# Patient Record
Sex: Female | Born: 1950
Health system: Southern US, Community
[De-identification: ages and names within clinical notes are randomized; demographics above are authoritative.]

## PROBLEM LIST (undated history)

## (undated) DIAGNOSIS — R7401 Elevation of levels of liver transaminase levels: Secondary | ICD-10-CM

## (undated) DIAGNOSIS — E785 Hyperlipidemia, unspecified: Secondary | ICD-10-CM

## (undated) DIAGNOSIS — R55 Syncope and collapse: Secondary | ICD-10-CM

## (undated) DIAGNOSIS — N390 Urinary tract infection, site not specified: Secondary | ICD-10-CM

## (undated) DIAGNOSIS — H40009 Preglaucoma, unspecified, unspecified eye: Secondary | ICD-10-CM

## (undated) DIAGNOSIS — N9489 Other specified conditions associated with female genital organs and menstrual cycle: Secondary | ICD-10-CM

## (undated) HISTORY — DX: Elevation of levels of liver transaminase levels: R74.01

## (undated) HISTORY — PX: TUBAL LIGATION: SHX77

## (undated) HISTORY — PX: TONSILLECTOMY: SUR1361

## (undated) HISTORY — DX: Hyperlipidemia, unspecified: E78.5

## (undated) HISTORY — PX: EXCISION OF ADNEXAL MASS: SHX5820

## (undated) HISTORY — DX: Preglaucoma, unspecified, unspecified eye: H40.009

---

## 1971-10-11 DIAGNOSIS — Z9289 Personal history of other medical treatment: Secondary | ICD-10-CM

## 1971-10-11 HISTORY — DX: Personal history of other medical treatment: Z92.89

## 1974-10-10 HISTORY — PX: TUBAL LIGATION: SHX77

## 2005-06-07 ENCOUNTER — Ambulatory Visit: Payer: Self-pay | Admitting: Family Medicine

## 2005-07-21 ENCOUNTER — Ambulatory Visit: Payer: Self-pay | Admitting: Family Medicine

## 2005-08-02 ENCOUNTER — Ambulatory Visit: Payer: Self-pay | Admitting: Family Medicine

## 2007-03-27 ENCOUNTER — Ambulatory Visit: Payer: Self-pay | Admitting: Emergency Medicine

## 2007-08-20 ENCOUNTER — Ambulatory Visit: Payer: Self-pay | Admitting: Family Medicine

## 2007-08-21 ENCOUNTER — Ambulatory Visit: Payer: Self-pay | Admitting: Family Medicine

## 2007-08-23 ENCOUNTER — Ambulatory Visit: Payer: Self-pay | Admitting: Family Medicine

## 2007-10-01 ENCOUNTER — Other Ambulatory Visit: Payer: Self-pay

## 2007-10-01 ENCOUNTER — Emergency Department: Payer: Self-pay | Admitting: Internal Medicine

## 2007-10-19 ENCOUNTER — Ambulatory Visit: Payer: Self-pay | Admitting: Unknown Physician Specialty

## 2007-10-19 LAB — HM COLONOSCOPY

## 2007-12-06 ENCOUNTER — Ambulatory Visit: Payer: Self-pay | Admitting: Family Medicine

## 2008-12-18 ENCOUNTER — Ambulatory Visit: Payer: Self-pay | Admitting: Family Medicine

## 2008-12-26 ENCOUNTER — Emergency Department: Payer: Self-pay | Admitting: Unknown Physician Specialty

## 2009-12-24 ENCOUNTER — Other Ambulatory Visit: Payer: Self-pay | Admitting: General Practice

## 2010-08-10 ENCOUNTER — Ambulatory Visit: Payer: Self-pay

## 2010-08-18 ENCOUNTER — Ambulatory Visit: Payer: Self-pay

## 2011-03-08 ENCOUNTER — Emergency Department: Payer: Self-pay | Admitting: Emergency Medicine

## 2011-03-10 ENCOUNTER — Encounter: Payer: Self-pay | Admitting: Cardiovascular Disease

## 2011-03-10 ENCOUNTER — Ambulatory Visit (INDEPENDENT_AMBULATORY_CARE_PROVIDER_SITE_OTHER): Payer: No Typology Code available for payment source | Admitting: Cardiovascular Disease

## 2011-03-10 DIAGNOSIS — R079 Chest pain, unspecified: Secondary | ICD-10-CM | POA: Insufficient documentation

## 2011-03-10 MED ORDER — NITROGLYCERIN 0.4 MG SL SUBL: 0.4000 mg | SUBLINGUAL_TABLET | SUBLINGUAL | Status: DC | PRN

## 2011-03-10 NOTE — Assessment & Plan Note (Signed)
Etiology of her chest pain is uncertain. Preliminary workup did not suggest cardiac etiology. She chronically feels well and has been exercising with no symptoms. She does have a history of hyperlipidemia but no significant smoking or diabetes. She does have a family history though both of her parents were smokers. We have suggested we start with a routine treadmill study which we will do early next week at her convenience. If this is positive or shows worrisome symptoms, we would proceed with a Myoview.  If she has additional episodes, we have suggested she take a nitroglycerin sublingual and have given her a prescription.  ` we would also recommend further evaluation in the emergency room with a CT scan.

## 2011-03-10 NOTE — Patient Instructions (Signed)
You are doing well. Please call to schedule the treadmill when it works for your schedule. Please call us if you have new issues that need to be addressed

## 2011-03-10 NOTE — Progress Notes (Signed)
   Patient ID: Mary [name], female    DOB: [birth year], 60 y.o.   MRN: [id number]  HPI Comments: Mary [name] is a very pleasant 60 year old woman who works at [employer] with no significant past medical history apart from borderline hyperlipidemia per her report who was recently evaluated at the [facility] emergency room after an episode of chest pain, sweating and dizziness.  2 days ago on May 29 she reports that she was walking and she had the acute onset of severe left-sided chest pain. The sharp pain lasted for 15 minutes. The more she walked, the worse it got. She sat down, had sweating and some dizziness. Eventually she sat down on the floor. A emergency room physician was called and she was evaluated in the emergency room. Her chest x-ray, lab work, EKG was essentially benign. She was discharged with followup in our clinic.  She went back to work yesterday and even walked at lunchtime for exercise and felt well with no symptoms of chest discomfort. She has not had an episode like this before.  EKG today shows normal sinus rhythm with rate 59 beats per minute with no significant ST or T wave changes     Review of Systems  Constitutional: Negative.   HENT: Negative.   Eyes: Negative.   Respiratory: Negative.   Cardiovascular: Positive for chest pain.  Gastrointestinal: Negative.   Musculoskeletal: Negative.   Skin: Negative.   Neurological: Positive for dizziness.  Hematological: Negative.   Psychiatric/Behavioral: Negative.   All other systems reviewed and are negative.    BP 106/62  Pulse 59  Ht 5\' 6"  (1.676 m)  Wt 176 lb (79.833 kg)  BMI 28.41 kg/m2   Physical Exam  Nursing note and vitals reviewed. Constitutional: She is oriented to person, place, and time. She appears well-developed and well-nourished.  HENT:  Head: Normocephalic.  Nose: Nose normal.  Mouth/Throat: Oropharynx is clear and moist.  Eyes: Conjunctivae are normal. Pupils are equal, round, and reactive to light.    Neck: Normal range of motion. Neck supple. No JVD present.  Cardiovascular: Normal rate, regular rhythm, S1 normal, S2 normal, normal heart sounds and intact distal pulses.  Exam reveals no gallop and no friction rub.   No murmur heard. Pulmonary/Chest: Effort normal and breath sounds normal. No respiratory distress. She has no wheezes. She has no rales. She exhibits no tenderness.  Abdominal: Soft. Bowel sounds are normal. She exhibits no distension. There is no tenderness.  Musculoskeletal: Normal range of motion. She exhibits no edema and no tenderness.  Lymphadenopathy:    She has no cervical adenopathy.  Neurological: She is alert and oriented to person, place, and time. Coordination normal.  Skin: Skin is warm and dry. No rash noted. No erythema.  Psychiatric: She has a normal mood and affect. Her behavior is normal. Judgment and thought content normal.         Assessment and Plan

## 2011-03-11 ENCOUNTER — Telehealth: Payer: Self-pay | Admitting: *Deleted

## 2011-03-11 NOTE — Telephone Encounter (Signed)
LMOM on pt's cell phone, and notified to call back with any questions. Gave pt instructions to eat a light breakfast only, wear comfortable clothing and tennis shoes for walking. Pt does not take any meds. Notified pt to arrive at registration at [facility] at 0730 for an 0800 appt. Advised pt to call back with any questions.

## 2011-03-11 NOTE — Telephone Encounter (Signed)
Called pt to notify her that her ETT is scheduled at [facility] for 03/16/11 @ 0800 and she will need to arrive at 0730. Will give pt instructions when calls back.

## 2011-03-16 ENCOUNTER — Ambulatory Visit: Payer: Self-pay | Admitting: Cardiovascular Disease

## 2011-03-16 DIAGNOSIS — R079 Chest pain, unspecified: Secondary | ICD-10-CM

## 2012-12-27 ENCOUNTER — Other Ambulatory Visit: Payer: Self-pay | Admitting: Family Medicine

## 2012-12-27 LAB — LIPID PANEL
Cholesterol: 195 mg/dL (ref 0–200)
HDL: 51 mg/dL (ref 35–70)
LDL CALC: 113 mg/dL
TRIGLYCERIDES: 155 mg/dL (ref 40–160)

## 2012-12-27 LAB — CBC WITH DIFFERENTIAL/PLATELET
Basophil #: 0.1 10*3/uL (ref 0.0–0.1)
Eosinophil %: 2.5 %
HCT: 42.2 % (ref 35.0–47.0)
HGB: 14.1 g/dL (ref 12.0–16.0)
Lymphocyte #: 1.8 10*3/uL (ref 1.0–3.6)
Lymphocyte %: 33.9 %
MCH: 30 pg (ref 26.0–34.0)
MCHC: 33.3 g/dL (ref 32.0–36.0)
Monocyte #: 0.4 x10 3/mm (ref 0.2–0.9)
Neutrophil %: 55.2 %
Platelet: 337 10*3/uL (ref 150–440)
WBC: 5.2 10*3/uL (ref 3.6–11.0)

## 2012-12-27 LAB — BASIC METABOLIC PANEL
BUN: 15 mg/dL (ref 4–21)
Creatinine: 0.7 mg/dL (ref 0.5–1.1)
Glucose: 85 mg/dL
Potassium: 4.2 mmol/L (ref 3.4–5.3)
Sodium: 140 mmol/L (ref 137–147)

## 2012-12-27 LAB — COMPREHENSIVE METABOLIC PANEL
Albumin: 4.2 g/dL (ref 3.4–5.0)
Bilirubin,Total: 0.5 mg/dL (ref 0.2–1.0)
Calcium, Total: 9 mg/dL (ref 8.5–10.1)
Co2: 27 mmol/L (ref 21–32)
EGFR (African American): >60
EGFR (Non-African Amer.): >60
Glucose: 85 mg/dL (ref 65–99)
Osmolality: 279 (ref 275–301)
Potassium: 4.2 mmol/L (ref 3.5–5.1)
SGOT(AST): 37 U/L (ref 15–37)
SGPT (ALT): 60 U/L (ref 12–78)
Total Protein: 8 g/dL (ref 6.4–8.2)

## 2012-12-27 LAB — CBC AND DIFFERENTIAL
HEMATOCRIT: 42 % (ref 36–46)
HEMOGLOBIN: 14.1 g/dL (ref 12.0–16.0)
Platelets: 337 10*3/uL (ref 150–399)
WBC: 5.2 10^3/mL

## 2012-12-27 LAB — TSH: TSH: 1.55 u[IU]/mL (ref 0.41–5.90)

## 2012-12-27 LAB — HEMOGLOBIN A1C: Hgb A1c MFr Bld: 5.5 % (ref 4.0–6.0)

## 2013-01-02 ENCOUNTER — Ambulatory Visit: Payer: Self-pay | Admitting: Family Medicine

## 2013-01-04 ENCOUNTER — Ambulatory Visit: Payer: Self-pay | Admitting: Family Medicine

## 2014-03-05 ENCOUNTER — Other Ambulatory Visit: Payer: Self-pay | Admitting: Family Medicine

## 2014-03-05 LAB — HEPATIC FUNCTION PANEL A (ARMC)
ALT: 34 U/L (ref 12–78)
Albumin: 3.7 g/dL (ref 3.4–5.0)
Alkaline Phosphatase: 64 U/L
Bilirubin, Direct: 0.1 mg/dL (ref 0.00–0.20)
Bilirubin,Total: 0.5 mg/dL (ref 0.2–1.0)
SGOT(AST): 27 U/L (ref 15–37)
Total Protein: 6.9 g/dL (ref 6.4–8.2)

## 2014-03-05 LAB — HEPATIC FUNCTION PANEL
ALK PHOS: 64 U/L (ref 25–125)
ALT: 34 U/L (ref 7–35)
AST: 27 U/L (ref 13–35)
BILIRUBIN, TOTAL: 0.5 mg/dL

## 2014-04-24 ENCOUNTER — Encounter (HOSPITAL_COMMUNITY): Payer: Self-pay | Admitting: Emergency Medicine

## 2014-04-24 ENCOUNTER — Emergency Department (HOSPITAL_COMMUNITY)
Admission: EM | Admit: 2014-04-24 | Discharge: 2014-04-24 | Disposition: A | Discharge status: Home / Self Care | Payer: 59 | Source: Home / Self Care | Attending: Emergency Medicine | Admitting: Emergency Medicine

## 2014-04-24 DIAGNOSIS — N3000 Acute cystitis without hematuria: Secondary | ICD-10-CM

## 2014-04-24 DIAGNOSIS — N3001 Acute cystitis with hematuria: Secondary | ICD-10-CM

## 2014-04-24 HISTORY — DX: Urinary tract infection, site not specified: N39.0

## 2014-04-24 LAB — POCT URINALYSIS DIP (DEVICE)
Glucose, UA: NEGATIVE mg/dL
Nitrite: NEGATIVE
PH: 5.5 (ref 5.0–8.0)
SPECIFIC GRAVITY, URINE: 1.02 (ref 1.005–1.030)
Urobilinogen, UA: 1 mg/dL (ref 0.0–1.0)

## 2014-04-24 MED ORDER — PHENAZOPYRIDINE HCL 200 MG PO TABS: 200.0000 mg | ORAL_TABLET | Freq: Three times a day (TID) | ORAL | Status: DC | PRN

## 2014-04-24 MED ORDER — CEPHALEXIN 500 MG PO CAPS: 500.0000 mg | ORAL_CAPSULE | Freq: Three times a day (TID) | ORAL | Status: DC

## 2014-04-24 NOTE — ED Notes (Signed)
C/o possible uti.  Reports symptoms x 1 week.  Reports frequent urination, symptoms get better than worsen.  Reports concentrated urina and visible blood in urine

## 2014-04-24 NOTE — Discharge Instructions (Signed)

## 2014-04-24 NOTE — ED Provider Notes (Signed)
  Chief Complaint   Chief Complaint  Patient presents with  . Urinary Tract Infection    History of Present Illness   Mary [name] is a 63 year old female who is a one-week history of urinary symptoms including bladder spasm, bladder pressure, dysuria, frequency, urgency , lower back pain, abdominal fullness and bloating, and gross blood in the urine. She denies fever, chills, nausea, vomiting, or GI symptoms. She has had a urinary tract infection before but it's been years ago.  Review of Systems   Other than as noted above, the patient denies any of the following symptoms: General:  No fevers or chills. GI:  No abdominal pain, back pain, nausea, or vomiting. GU:  No hematuria or incontinence. GYN:  No discharge, itching, vulvar pain or lesions, pelvic pain, or abnormal vaginal bleeding.  [place]   Past medical history, family history, social history, meds, and allergies were reviewed.    Physical Examination     Vital signs:  BP 133/84  Pulse 84  Temp(Src) 98.7 F (37.1 C) (Oral)  Resp 16  SpO2 97% Gen:  Alert, oriented, in no distress. Lungs:  Clear to auscultation, no wheezes, rales or rhonchi. Heart:  Regular rhythm, no gallop or murmer. Abdomen:  Flat and soft. There was slight suprapubic pain to palpation.  No guarding, or rebound.  No hepato-splenomegaly or mass.  Bowel sounds were normally active.  No hernia. Back:  No CVA tenderness.  Skin:  Clear, warm and dry.  Labs   Results for orders placed during the hospital encounter of 04/24/14  POCT URINALYSIS DIP (DEVICE)      Result Value Ref Range   Glucose, UA NEGATIVE  NEGATIVE mg/dL   Bilirubin Urine MODERATE (*) NEGATIVE   Ketones, ur TRACE (*) NEGATIVE mg/dL   Specific Gravity, Urine 1.020  1.005 - 1.030   Hgb urine dipstick LARGE (*) NEGATIVE   pH 5.5  5.0 - 8.0   Protein, ur >=300 (*) NEGATIVE mg/dL   Urobilinogen, UA 1.0  0.0 - 1.0 mg/dL   Nitrite NEGATIVE  NEGATIVE   Leukocytes, UA LARGE (*) NEGATIVE      A urine culture was obtained.  Results are pending at this time and we will call about any positive results.  Assessment   The encounter diagnosis was Acute cystitis with hematuria.   No evidence of pyelonephritis.    Plan   1.  Meds:  The following meds were prescribed:   New Prescriptions   CEPHALEXIN (KEFLEX) 500 MG CAPSULE    Take 1 capsule (500 mg total) by mouth 3 (three) times daily.   PHENAZOPYRIDINE (PYRIDIUM) 200 MG TABLET    Take 1 tablet (200 mg total) by mouth 3 (three) times daily as needed for pain.    2.  Patient Education/Counseling:  The patient was given appropriate handouts, self care instructions, and instructed in symptomatic relief. The patient was told to avoid intercourse for 10 days, get extra fluids, and return for a follow up with her primary care doctor at the completion of treatment for a repeat UA and culture.    3.  Follow up:  The patient was told to follow up here if no better in 3 to 4 days, or sooner if becoming worse in any way, and given some red flag symptoms such as fever, persistent vomiting, or severe flank or abdominal pain which would prompt immediate return.     Harden Mo, MD 04/24/14 [phone]

## 2014-04-27 LAB — URINE CULTURE: Colony Count: >=100000

## 2014-04-27 NOTE — ED Notes (Signed)
Urine culture: >100,000 colonies E. Coli. Pt. adequately treated with Keflex. Roselyn Meier 04/27/2014

## 2014-04-27 NOTE — Progress Notes (Signed)
Quick Note:  Results are abnormal as noted, but have been adequately treated. No further action necessary. ______ 

## 2014-06-10 ENCOUNTER — Ambulatory Visit: Payer: Self-pay

## 2015-04-07 ENCOUNTER — Encounter (HOSPITAL_COMMUNITY): Payer: Self-pay | Admitting: Emergency Medicine

## 2015-04-07 ENCOUNTER — Emergency Department (HOSPITAL_COMMUNITY)
Admission: EM | Admit: 2015-04-07 | Discharge: 2015-04-07 | Disposition: A | Discharge status: Home / Self Care | Payer: 59 | Source: Home / Self Care | Attending: Family Medicine | Admitting: Family Medicine

## 2015-04-07 DIAGNOSIS — N390 Urinary tract infection, site not specified: Secondary | ICD-10-CM

## 2015-04-07 LAB — POCT URINALYSIS DIP (DEVICE)
Bilirubin Urine: NEGATIVE
Glucose, UA: NEGATIVE mg/dL
Hgb urine dipstick: NEGATIVE
KETONES UR: NEGATIVE mg/dL
Nitrite: NEGATIVE
PROTEIN: 30 mg/dL — AB
Specific Gravity, Urine: >=1.03 (ref 1.005–1.030)
UROBILINOGEN UA: 0.2 mg/dL (ref 0.0–1.0)
pH: 5.5 (ref 5.0–8.0)

## 2015-04-07 MED ORDER — CEPHALEXIN 500 MG PO CAPS: 500.0000 mg | ORAL_CAPSULE | Freq: Two times a day (BID) | ORAL | Status: DC

## 2015-04-07 NOTE — ED Notes (Signed)
C/o UTI sx onset yest Sx include dysuria, abd discomfort, chills Denies fevers Alert, no signs of acute distress

## 2015-04-07 NOTE — Discharge Instructions (Signed)
Thank you for coming in today. If your belly pain worsens, or you have high fever, bad vomiting, blood in your stool or black tarry stool go to the Emergency Room.   Urinary Tract Infection Urinary tract infections (UTIs) can develop anywhere along your urinary tract. Your urinary tract is your body's drainage system for removing wastes and extra water. Your urinary tract includes two kidneys, two ureters, a bladder, and a urethra. Your kidneys are a pair of bean-shaped organs. Each kidney is about the size of your fist. They are located below your ribs, one on each side of your spine. CAUSES Infections are caused by microbes, which are microscopic organisms, including fungi, viruses, and bacteria. These organisms are so small that they can only be seen through a microscope. Bacteria are the microbes that most commonly cause UTIs. SYMPTOMS  Symptoms of UTIs may vary by age and gender of the patient and by the location of the infection. Symptoms in young women typically include a frequent and intense urge to urinate and a painful, burning feeling in the bladder or urethra during urination. Older women and men are more likely to be tired, shaky, and weak and have muscle aches and abdominal pain. A fever may mean the infection is in your kidneys. Other symptoms of a kidney infection include pain in your back or sides below the ribs, nausea, and vomiting. DIAGNOSIS To diagnose a UTI, your caregiver will ask you about your symptoms. Your caregiver also will ask to provide a urine sample. The urine sample will be tested for bacteria and white blood cells. White blood cells are made by your body to help fight infection. TREATMENT  Typically, UTIs can be treated with medication. Because most UTIs are caused by a bacterial infection, they usually can be treated with the use of antibiotics. The choice of antibiotic and length of treatment depend on your symptoms and the type of bacteria causing your  infection. HOME CARE INSTRUCTIONS  If you were prescribed antibiotics, take them exactly as your caregiver instructs you. Finish the medication even if you feel better after you have only taken some of the medication.  Drink enough water and fluids to keep your urine clear or pale yellow.  Avoid caffeine, tea, and carbonated beverages. They tend to irritate your bladder.  Empty your bladder often. Avoid holding urine for long periods of time.  Empty your bladder before and after sexual intercourse.  After a bowel movement, women should cleanse from front to back. Use each tissue only once. SEEK MEDICAL CARE IF:   You have back pain.  You develop a fever.  Your symptoms do not begin to resolve within 3 days. SEEK IMMEDIATE MEDICAL CARE IF:   You have severe back pain or lower abdominal pain.  You develop chills.  You have nausea or vomiting.  You have continued burning or discomfort with urination. MAKE SURE YOU:   Understand these instructions.  Will watch your condition.  Will get help right away if you are not doing well or get worse. Document Released: 07/06/2005 Document Revised: 03/27/2012 Document Reviewed: 11/04/2011 ExitCare Patient Information 2015 [employer]. This information is not intended to replace advice given to you by your health care provider. Make sure you discuss any questions you have with your health care provider.  

## 2015-04-07 NOTE — ED Provider Notes (Signed)
Mary [name] is a 64 y.o. female who presents to Urgent Care today for urinary frequency urgency and dysuria. Symptoms started yesterday. Symptoms are consistent with urinary tract infection. No fevers chills nausea vomiting or diarrhea. No treatment tried yet.   Past Medical History  Diagnosis Date  . UTI (lower urinary tract infection)    History reviewed. No pertinent past surgical history. History  Substance Use Topics  . Smoking status: Never Smoker   . Smokeless tobacco: Not on file  . Alcohol Use: 0.5 oz/week    1 drink(s) per week   ROS as above Medications: No current facility-administered medications for this encounter.   Current Outpatient Prescriptions  Medication Sig Dispense Refill  . cephALEXin (KEFLEX) 500 MG capsule Take 1 capsule (500 mg total) by mouth 2 (two) times daily. 14 capsule 0  . Multiple Vitamin (MULTIVITAMIN) tablet Take 1 tablet by mouth daily.    . nitroGLYCERIN (NITROSTAT) 0.4 MG SL tablet Place 1 tablet (0.4 mg total) under the tongue every 5 (five) minutes as needed for chest pain. 25 tablet 3  . phenazopyridine (PYRIDIUM) 200 MG tablet Take 1 tablet (200 mg total) by mouth 3 (three) times daily as needed for pain. 15 tablet 0   No Known Allergies   Exam:  BP 122/66 mmHg  Pulse 56  Temp(Src) 97 F (36.1 C) (Oral)  Resp 12  SpO2 96% Gen: Well NAD HEENT: EOMI,  MMM Lungs: Normal work of breathing. CTABL Heart: RRR no MRG Abd: NABS, Soft. Nondistended, Nontender no CV angle tenderness to percussion Exts: Brisk capillary refill, warm and well perfused.   Results for orders placed or performed during the hospital encounter of 04/07/15 (from the past 24 hour(s))  POCT urinalysis dip (device)     Status: Abnormal   Collection Time: 04/07/15  8:06 PM  Result Value Ref Range   Glucose, UA NEGATIVE NEGATIVE mg/dL   Bilirubin Urine NEGATIVE NEGATIVE   Ketones, ur NEGATIVE NEGATIVE mg/dL   Specific Gravity, Urine >=1.030 1.005 - 1.030   Hgb  urine dipstick NEGATIVE NEGATIVE   pH 5.5 5.0 - 8.0   Protein, ur 30 (A) NEGATIVE mg/dL   Urobilinogen, UA 0.2 0.0 - 1.0 mg/dL   Nitrite NEGATIVE NEGATIVE   Leukocytes, UA TRACE (A) NEGATIVE   No results found.  Assessment and Plan: 64 y.o. female with UTI. Culture pending. Treat with Keflex.  Discussed warning signs or symptoms. Please see discharge instructions. Patient expresses understanding.     Gregor Hams, MD 04/07/15 2017

## 2015-04-11 LAB — URINE CULTURE: SPECIAL REQUESTS: NORMAL

## 2015-04-13 NOTE — ED Notes (Signed)
teated for UTI on day of visit w Rx for keflex. UA culture report shows organism identified responds to Rx written

## 2015-07-14 DIAGNOSIS — E559 Vitamin D deficiency, unspecified: Secondary | ICD-10-CM | POA: Insufficient documentation

## 2015-07-14 DIAGNOSIS — E785 Hyperlipidemia, unspecified: Secondary | ICD-10-CM | POA: Insufficient documentation

## 2015-07-14 DIAGNOSIS — M8580 Other specified disorders of bone density and structure, unspecified site: Secondary | ICD-10-CM | POA: Insufficient documentation

## 2015-07-14 DIAGNOSIS — G4700 Insomnia, unspecified: Secondary | ICD-10-CM | POA: Insufficient documentation

## 2015-07-14 DIAGNOSIS — R748 Abnormal levels of other serum enzymes: Secondary | ICD-10-CM | POA: Insufficient documentation

## 2015-07-14 DIAGNOSIS — N83209 Unspecified ovarian cyst, unspecified side: Secondary | ICD-10-CM | POA: Insufficient documentation

## 2015-07-16 ENCOUNTER — Encounter: Payer: Self-pay | Admitting: Family Medicine

## 2015-08-13 ENCOUNTER — Encounter: Payer: Self-pay | Admitting: Family Medicine

## 2015-08-13 ENCOUNTER — Ambulatory Visit
Admission: RE | Admit: 2015-08-13 | Discharge: 2015-08-13 | Disposition: A | Discharge status: Home / Self Care | Payer: 59 | Source: Ambulatory Visit | Attending: Family Medicine | Admitting: Family Medicine

## 2015-08-13 ENCOUNTER — Other Ambulatory Visit: Payer: Self-pay | Admitting: Family Medicine

## 2015-08-13 ENCOUNTER — Ambulatory Visit (INDEPENDENT_AMBULATORY_CARE_PROVIDER_SITE_OTHER): Payer: 59 | Admitting: Family Medicine

## 2015-08-13 ENCOUNTER — Other Ambulatory Visit
Admission: RE | Admit: 2015-08-13 | Discharge: 2015-08-13 | Disposition: A | Discharge status: Home / Self Care | Payer: 59 | Source: Ambulatory Visit | Attending: Family Medicine | Admitting: Family Medicine

## 2015-08-13 VITALS — BP 102/58 | HR 52 | Temp 97.8°F | Resp 16 | Ht 66.0 in | Wt 166.0 lb

## 2015-08-13 DIAGNOSIS — Z1211 Encounter for screening for malignant neoplasm of colon: Secondary | ICD-10-CM

## 2015-08-13 DIAGNOSIS — Z780 Asymptomatic menopausal state: Secondary | ICD-10-CM | POA: Diagnosis not present

## 2015-08-13 DIAGNOSIS — N952 Postmenopausal atrophic vaginitis: Secondary | ICD-10-CM

## 2015-08-13 DIAGNOSIS — Z23 Encounter for immunization: Secondary | ICD-10-CM

## 2015-08-13 DIAGNOSIS — Z1239 Encounter for other screening for malignant neoplasm of breast: Secondary | ICD-10-CM | POA: Diagnosis not present

## 2015-08-13 DIAGNOSIS — Z1231 Encounter for screening mammogram for malignant neoplasm of breast: Secondary | ICD-10-CM | POA: Insufficient documentation

## 2015-08-13 DIAGNOSIS — Z124 Encounter for screening for malignant neoplasm of cervix: Secondary | ICD-10-CM

## 2015-08-13 DIAGNOSIS — Z0000 Encounter for general adult medical examination without abnormal findings: Secondary | ICD-10-CM | POA: Diagnosis not present

## 2015-08-13 DIAGNOSIS — Z13820 Encounter for screening for osteoporosis: Secondary | ICD-10-CM | POA: Insufficient documentation

## 2015-08-13 DIAGNOSIS — M8580 Other specified disorders of bone density and structure, unspecified site: Secondary | ICD-10-CM

## 2015-08-13 LAB — CBC WITH DIFFERENTIAL/PLATELET
BASOS ABS: 0 10*3/uL (ref 0–0.1)
Basophils Relative: 1 %
Eosinophils Absolute: 0.2 10*3/uL (ref 0–0.7)
Eosinophils Relative: 3 %
HEMATOCRIT: 40.6 % (ref 35.0–47.0)
Hemoglobin: 13.4 g/dL (ref 12.0–16.0)
LYMPHS PCT: 37 %
Lymphs Abs: 1.9 10*3/uL (ref 1.0–3.6)
MCH: 29.8 pg (ref 26.0–34.0)
MCHC: 33.1 g/dL (ref 32.0–36.0)
MCV: 89.9 fL (ref 80.0–100.0)
Monocytes Absolute: 0.5 10*3/uL (ref 0.2–0.9)
Monocytes Relative: 10 %
Neutro Abs: 2.5 10*3/uL (ref 1.4–6.5)
Neutrophils Relative %: 49 %
PLATELETS: 283 10*3/uL (ref 150–440)
RBC: 4.52 MIL/uL (ref 3.80–5.20)
RDW: 13.2 % (ref 11.5–14.5)
WBC: 5.1 10*3/uL (ref 3.6–11.0)

## 2015-08-13 LAB — COMPREHENSIVE METABOLIC PANEL
ALK PHOS: 43 U/L (ref 38–126)
ALT: 25 U/L (ref 14–54)
AST: 25 U/L (ref 15–41)
Albumin: 4.8 g/dL (ref 3.5–5.0)
Anion gap: 6 (ref 5–15)
BUN: 16 mg/dL (ref 6–20)
CO2: 29 mmol/L (ref 22–32)
CREATININE: 0.72 mg/dL (ref 0.44–1.00)
Calcium: 9.7 mg/dL (ref 8.9–10.3)
Chloride: 105 mmol/L (ref 101–111)
Glucose, Bld: 89 mg/dL (ref 65–99)
Potassium: 4.7 mmol/L (ref 3.5–5.1)
Sodium: 140 mmol/L (ref 135–145)
TOTAL PROTEIN: 7.6 g/dL (ref 6.5–8.1)
Total Bilirubin: 0.6 mg/dL (ref 0.3–1.2)

## 2015-08-13 LAB — POCT URINALYSIS DIPSTICK
BILIRUBIN UA: NEGATIVE
Blood, UA: NEGATIVE
Glucose, UA: NEGATIVE
Ketones, UA: NEGATIVE
LEUKOCYTES UA: NEGATIVE
Nitrite, UA: NEGATIVE
Protein, UA: NEGATIVE
Spec Grav, UA: 1.02
Urobilinogen, UA: 0.2
pH, UA: 7

## 2015-08-13 LAB — LIPID PANEL
CHOL/HDL RATIO: 3.6 ratio
Cholesterol: 204 mg/dL — ABNORMAL HIGH (ref 0–200)
HDL: 57 mg/dL (ref >40)
LDL Cholesterol: 119 mg/dL — ABNORMAL HIGH (ref 0–99)
Triglycerides: 139 mg/dL (ref <150)
VLDL: 28 mg/dL (ref 0–40)

## 2015-08-13 LAB — IFOBT (OCCULT BLOOD): IFOBT: NEGATIVE

## 2015-08-13 LAB — TSH: TSH: 1.505 u[IU]/mL (ref 0.350–4.500)

## 2015-08-13 MED ORDER — ESTROGENS, CONJUGATED 0.625 MG/GM VA CREA: 1.0000 | TOPICAL_CREAM | Freq: Every day | VAGINAL | Status: DC

## 2015-08-13 NOTE — Addendum Note (Signed)
Addended by: Luvenia Heller on: 08/13/2015 03:15 PM   Modules accepted: Orders

## 2015-08-13 NOTE — Progress Notes (Signed)
Patient ID: Mary [name], female   DOB: 05/26/1951, 64 y.o.   MRN: [id number]       Patient: Mary [name], Female    DOB: 12/06/1950, 64 y.o.   MRN: [id number] Visit Date: 08/13/2015  Today's Provider: Wilhemena Durie, MD   Chief Complaint  Patient presents with  . Annual Exam   Subjective:    Annual physical exam Mary [name] is a 64 y.o. female who presents today for health maintenance and complete physical. She feels well. She reports exercising 3 times a week. She reports she is sleeping fairly well.  Pt reports that she needs something for vaginal dryness. She reports that is is almost painful. ----------------------------------------------------------------- Mammo- 01/04/13 BMD- 01/02/13- Osteopenia EKG- 12/20/12 Colonoscopy 10/19/07- Hemorrhoids, diverticulosis repeat 10 years Pap-08/15/07 normal   Review of Systems  Constitutional: Negative.   HENT: Positive for rhinorrhea.   Eyes: Negative.   Respiratory: Negative.   Cardiovascular: Negative.   Gastrointestinal: Negative.   Endocrine: Negative.   Genitourinary: Negative.        Vaginal dryness. Choroidal and post coital pain due to this dryness. She has tried over-the-counter lubricants with minimal improvement.  Musculoskeletal: Negative.   Skin: Negative.   Allergic/Immunologic: Negative.   Neurological: Negative.   Hematological: Negative.   Psychiatric/Behavioral: Negative.     Social History She  reports that she has never smoked. She does not have any smokeless tobacco history on file. She reports that she drinks about 0.5 oz of alcohol per week. She reports that she does not use illicit drugs. Social History   Social History  . Marital Status: Married    Spouse Name: N/A  . Number of Children: N/A  . Years of Education: N/A   Social History Main Topics  . Smoking status: Never Smoker   . Smokeless tobacco: Not on file  . Alcohol Use: 0.5 oz/week    1 Standard drinks or equivalent per week  . Drug  Use: No  . Sexual Activity: Not on file   Other Topics Concern  . Not on file   Social History Narrative    Patient Active Problem List   Diagnosis Date Noted  . Insomnia, persistent 07/14/2015  . Abnormal liver enzymes 07/14/2015  . HLD (hyperlipidemia) 07/14/2015  . Osteopenia 07/14/2015  . Cyst of ovary 07/14/2015  . Avitaminosis D 07/14/2015  . Chest pain 03/10/2011    Past Surgical History  Procedure Laterality Date  . Tubal ligation    . Tonsillectomy      Family History  Family Status  Relation Status Death Age  . Mother Deceased 67    stents/alzheimer's  . Father Deceased     A-fib  . Sister Alive     SVT/ablation  . Brother Alive   . Maternal Grandmother Deceased   . Maternal Grandfather Deceased   . Paternal Grandfather Deceased    Her family history includes Alzheimer's disease in her mother; COPD in her father; Diabetes in her mother; Heart attack in her father; Heart disease in her brother, father, and mother; Hypertension in her brother; Parkinson's disease in her maternal grandmother; Skin cancer in her father.    Allergies  Allergen Reactions  . Amoxicillin Rash  . Hydrocodone-Acetaminophen Rash    Previous Medications   CEPHALEXIN (KEFLEX) 500 MG CAPSULE    Take 1 capsule (500 mg total) by mouth 2 (two) times daily.   CLARITHROMYCIN (BIAXIN) 500 MG TABLET    Take by mouth.   LATANOPROST (XALATAN)  0.005 % OPHTHALMIC SOLUTION    Apply to eye.   MOMETASONE (ELOCON) 0.1 % CREAM    MOMETASONE FUROATE, 0.1% (External Cream)  1 (one) Cream Cream apply daily to the area for 0 days  Quantity: 15;  Refills: 2   Ordered :09-Aug-2013  Oletta Lamas ;  Started 09-Aug-2013 Active   MULTIPLE VITAMIN (MULTIVITAMIN) TABLET    Take 1 tablet by mouth daily.   NITROGLYCERIN (NITROSTAT) 0.4 MG SL TABLET    Place 1 tablet (0.4 mg total) under the tongue every 5 (five) minutes as needed for chest pain.   PHENAZOPYRIDINE (PYRIDIUM) 200 MG TABLET    Take 1 tablet  (200 mg total) by mouth 3 (three) times daily as needed for pain.   TEMAZEPAM (RESTORIL) 30 MG CAPSULE    Take by mouth.    Patient Care Team: Jerrol Banana., MD as PCP - General (Unknown Physician Specialty)     Objective:   Vitals: There were no vitals taken for this visit.   Physical Exam  Constitutional: She is oriented to person, place, and time. She appears well-developed and well-nourished.  HENT:  Head: Normocephalic and atraumatic.  Right Ear: External ear normal.  Left Ear: External ear normal.  Nose: Nose normal.  Mouth/Throat: Oropharynx is clear and moist.  Eyes: Conjunctivae are normal. Pupils are equal, round, and reactive to light.  Cardiovascular: Normal rate, regular rhythm, normal heart sounds and intact distal pulses.   Pulmonary/Chest: Effort normal and breath sounds normal.  Abdominal: Soft.  Genitourinary: Vagina normal and uterus normal.  Mild atrophic vaginitis without obvious cystocele or rectocele. No mucosal lesions noted.  Neurological: She is alert and oriented to person, place, and time.  Skin: Skin is warm and dry.  Psychiatric: She has a normal mood and affect. Her behavior is normal. Thought content normal.     Depression Screen No flowsheet data found.    Assessment & Plan:     Routine Health Maintenance and Physical Exam  Exercise Activities and Dietary recommendations Goals    None      Immunization History  Administered Date(s) Administered  . Td 05/31/2005  . Tdap 02/01/2010    Health Maintenance  Topic Date Due  . Hepatitis C Screening  1951-05-24  . HIV Screening  03/19/1966  . PAP SMEAR  03/19/1972  . MAMMOGRAM  03/19/2001  . ZOSTAVAX  03/20/2011  . INFLUENZA VACCINE  05/11/2015  . COLONOSCOPY  10/18/2017  . TETANUS/TDAP  02/02/2020      Discussed health benefits of physical activity, and encouraged her to engage in regular exercise appropriate for her age and condition.   Atrophic  vaginitis/postmenopausal issues Try Premarin vaginal cream 1 g at nighttime twice a week and see her back in 2-3 months. We discussed risks and benefits of unopposed estrogen in a lady with her uterus. We'll cut back to once a week if this is helping some. I have done the exam and reviewed the above chart and it is accurate to the best of my knowledge.  --------------------------------------------------------------------

## 2015-08-18 LAB — PAP IG AND HPV HIGH-RISK
HPV, high-risk: NEGATIVE
PAP SMEAR COMMENT: 0

## 2015-10-02 ENCOUNTER — Telehealth: Payer: 59 | Admitting: Internal Medicine

## 2015-10-02 DIAGNOSIS — N3000 Acute cystitis without hematuria: Secondary | ICD-10-CM

## 2015-10-02 MED ORDER — SULFAMETHOXAZOLE-TRIMETHOPRIM 800-160 MG PO TABS: 1.0000 | ORAL_TABLET | Freq: Two times a day (BID) | ORAL | Status: DC

## 2015-10-02 MED ORDER — PHENAZOPYRIDINE HCL 95 MG PO TABS: 95.0000 mg | ORAL_TABLET | Freq: Three times a day (TID) | ORAL | Status: DC | PRN

## 2015-10-02 NOTE — Progress Notes (Signed)

## 2015-10-27 DIAGNOSIS — H40003 Preglaucoma, unspecified, bilateral: Secondary | ICD-10-CM | POA: Diagnosis not present

## 2015-12-21 ENCOUNTER — Ambulatory Visit: Payer: 59 | Admitting: Family Medicine

## 2016-10-18 LAB — BASIC METABOLIC PANEL
BUN: 14 mg/dL (ref 4–21)
Creatinine: 0.7 mg/dL (ref 0.5–1.1)
Glucose: 109 mg/dL
POTASSIUM: 4.8 mmol/L (ref 3.4–5.3)
Sodium: 140 mmol/L (ref 137–147)

## 2016-10-18 LAB — CBC AND DIFFERENTIAL
HEMATOCRIT: 41 % (ref 36–46)
HEMOGLOBIN: 13.6 g/dL (ref 12.0–16.0)
NEUTROS ABS: 7 /uL
Platelets: 285 10*3/uL (ref 150–399)
WBC: 8.6 10^3/mL

## 2016-10-18 LAB — TSH: TSH: 0.71 u[IU]/mL (ref 0.41–5.90)

## 2016-10-31 ENCOUNTER — Telehealth: Payer: Self-pay | Admitting: Family Medicine

## 2016-10-31 ENCOUNTER — Ambulatory Visit (INDEPENDENT_AMBULATORY_CARE_PROVIDER_SITE_OTHER): Payer: PRIVATE HEALTH INSURANCE | Admitting: Family Medicine

## 2016-10-31 ENCOUNTER — Encounter: Payer: Self-pay | Admitting: Family Medicine

## 2016-10-31 VITALS — BP 100/62 | HR 56 | Temp 97.6°F | Resp 14 | Wt 183.0 lb

## 2016-10-31 DIAGNOSIS — T148XXA Other injury of unspecified body region, initial encounter: Secondary | ICD-10-CM | POA: Diagnosis not present

## 2016-10-31 DIAGNOSIS — L309 Dermatitis, unspecified: Secondary | ICD-10-CM

## 2016-10-31 DIAGNOSIS — R55 Syncope and collapse: Secondary | ICD-10-CM | POA: Diagnosis not present

## 2016-10-31 MED ORDER — MOMETASONE FUROATE 0.1 % EX CREA: TOPICAL_CREAM | Freq: Every day | CUTANEOUS | 0 refills | Status: DC

## 2016-10-31 MED ORDER — NAPROXEN 500 MG PO TABS: 500.0000 mg | ORAL_TABLET | Freq: Two times a day (BID) | ORAL | 1 refills | Status: DC

## 2016-10-31 NOTE — Telephone Encounter (Signed)
Pt was seen 10/18/2016 at [facility] ER due to feeling dizzy and passing out.  Pt will bring paper work with here from the hospital.  I have scheduled a hospital follow up appointment for today/MW
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
## 2016-12-26 ENCOUNTER — Ambulatory Visit
Admission: RE | Admit: 2016-12-26 | Discharge: 2016-12-26 | Disposition: A | Discharge status: Home / Self Care | Payer: PRIVATE HEALTH INSURANCE | Source: Ambulatory Visit | Attending: Family Medicine | Admitting: Family Medicine

## 2016-12-26 ENCOUNTER — Ambulatory Visit (INDEPENDENT_AMBULATORY_CARE_PROVIDER_SITE_OTHER): Payer: PRIVATE HEALTH INSURANCE | Admitting: Family Medicine

## 2016-12-26 ENCOUNTER — Encounter: Payer: Self-pay | Admitting: Family Medicine

## 2016-12-26 VITALS — BP 102/60 | HR 62 | Temp 97.7°F | Resp 12 | Wt 184.0 lb

## 2016-12-26 DIAGNOSIS — M5441 Lumbago with sciatica, right side: Secondary | ICD-10-CM

## 2016-12-26 DIAGNOSIS — G4700 Insomnia, unspecified: Secondary | ICD-10-CM

## 2016-12-26 DIAGNOSIS — M5136 Other intervertebral disc degeneration, lumbar region: Secondary | ICD-10-CM | POA: Diagnosis not present

## 2016-12-26 DIAGNOSIS — E784 Other hyperlipidemia: Secondary | ICD-10-CM | POA: Diagnosis not present

## 2016-12-26 DIAGNOSIS — E7849 Other hyperlipidemia: Secondary | ICD-10-CM

## 2016-12-26 DIAGNOSIS — Z23 Encounter for immunization: Secondary | ICD-10-CM

## 2016-12-26 DIAGNOSIS — M4316 Spondylolisthesis, lumbar region: Secondary | ICD-10-CM | POA: Diagnosis not present

## 2016-12-26 MED ORDER — CYCLOBENZAPRINE HCL 10 MG PO TABS: 10.0000 mg | ORAL_TABLET | Freq: Every day | ORAL | 1 refills | Status: DC

## 2016-12-26 NOTE — Progress Notes (Signed)
Mary [name]  MRN: [id number] DOB: Sep 06, 1951  Subjective:  HPI  Patient is here for follow up. LOV was on 10/31/16 for acute issues. Last follow up visit was on 08/13/15 Lab Results  Component Value Date   CHOL 204 (H) 08/13/2015   HDL 57 08/13/2015   LDLCALC 119 (H) 08/13/2015   TRIG 139 08/13/2015   CHOLHDL 3.6 08/13/2015   Last labs were done on 08/13/15-routine. But then had labs done in January 2018-CBC, MetC, TSH, Troponin when she had the syncope episode. She has not had any more issues with this. When patient was seen in January she was having back issues and it was thought to be a muscle strain and she was started on Naproxen which she is still taking. Patient states pain is worse especially at night and in the morning when she gets up. Exercising and moving around seems to help actually. It is an achy constant pain. Pain located in the back-right lower area and radiates down to her right leg. She does not recall injuring herself. Similar pain has been occasionally bothering her through the years after yard work and exercise but would not last this long. She has not seen a specialist for this and has not had any imaging done. Naproxen is helping symptoms when she takes it. For sleep she takes Temazepam at times about 1 to 2 times a month or less. With the pain she has been having she is having hard time sleeping at times.  Patient Active Problem List   Diagnosis Date Noted  . Insomnia, persistent 07/14/2015  . Abnormal liver enzymes 07/14/2015  . HLD (hyperlipidemia) 07/14/2015  . Osteopenia 07/14/2015  . Cyst of ovary 07/14/2015  . Avitaminosis D 07/14/2015  . Chest pain 03/10/2011    Past Medical History:  Diagnosis Date  . UTI (lower urinary tract infection)     Social History   Social History  . Marital status: Married    Spouse name: N/A  . Number of children: N/A  . Years of education: N/A   Occupational History  . Not on file.   Social History Main  Topics  . Smoking status: Never Smoker  . Smokeless tobacco: Never Used  . Alcohol use 0.5 oz/week    1 Standard drinks or equivalent per week     Comment: 1 drink a day  . Drug use: No  . Sexual activity: Not on file   Other Topics Concern  . Not on file   Social History Narrative  . No narrative on file    Outpatient Encounter Prescriptions as of 12/26/2016  Medication Sig Note  . conjugated estrogens (PREMARIN) vaginal cream Place 1 Applicatorful vaginally daily.   Marland Kitchen latanoprost (XALATAN) 0.005 % ophthalmic solution Apply to eye. 07/14/2015: Received from: [facility]  . mometasone (ELOCON) 0.1 % cream Apply topically daily. Apply to affected area..   . naproxen (NAPROSYN) 500 MG tablet Take 1 tablet (500 mg total) by mouth 2 (two) times daily with a meal.   . temazepam (RESTORIL) 30 MG capsule Take by mouth. 07/14/2015: Medication taken as needed.  Received from: [facility]  . [DISCONTINUED] Multiple Vitamin (MULTIVITAMIN) tablet Take 1 tablet by mouth daily.   . [DISCONTINUED] phenazopyridine (PYRIDIUM) 200 MG tablet Take 1 tablet (200 mg total) by mouth 3 (three) times daily as needed for pain. (Patient not taking: Reported on 08/13/2015)   . [DISCONTINUED] phenazopyridine (PYRIDIUM) 95 MG tablet Take 1 tablet (95 mg total) by  mouth 3 (three) times daily as needed for pain. (Patient not taking: Reported on 10/31/2016)   . [DISCONTINUED] sulfamethoxazole-trimethoprim (BACTRIM DS,SEPTRA DS) 800-160 MG tablet Take 1 tablet by mouth 2 (two) times daily. (Patient not taking: Reported on 10/31/2016)    No facility-administered encounter medications on file as of 12/26/2016.     Allergies  Allergen Reactions  . Amoxicillin Rash  . Hydrocodone-Acetaminophen Rash    Review of Systems  Respiratory: Negative.   Cardiovascular: Negative.   Musculoskeletal: Positive for back pain and joint pain.  Neurological: Positive for weakness (in the right leg  when she wakes up in the morning and tries to walk/move around).  Psychiatric/Behavioral: The patient has insomnia.     Objective:  BP 102/60   Pulse 62   Temp 97.7 F (36.5 C)   Resp 12   Wt 184 lb (83.5 kg)   BMI 29.70 kg/m   Physical Exam  Constitutional: She is oriented to person, place, and time and well-developed, well-nourished, and in no distress.  HENT:  Head: Normocephalic and atraumatic.  Eyes: Conjunctivae are normal. Pupils are equal, round, and reactive to light.  Neck: Normal range of motion. Neck supple.  Cardiovascular: Normal rate, regular rhythm, normal heart sounds and intact distal pulses.   No murmur heard. Pulmonary/Chest: Effort normal and breath sounds normal. No respiratory distress. She has no wheezes.  Musculoskeletal: She exhibits tenderness. She exhibits no edema.  Tenderness over the right sciatica notch  Neurological: She is alert and oriented to person, place, and time.  Neurological exam of lower extremities is non focal, straight leg is negative  Psychiatric: Mood, memory, affect and judgment normal.    Assessment and Plan :  1. Other hyperlipidemia Check level on next visit.  2. Insomnia, persistent Stable. Takes Temazepam as needed.  3. Acute back pain with sciatica, right Worse. Add Flexeril, can continue taking Naproxen. Advised patient to not take Temazepam when she takes Flexeril. Refer to Physical therapy. Get Xrays today. Consider Prednisone on the next visit if she is not better and/or referral.May need MRI. - Ambulatory referral to Physical Therapy - DG HIP UNILAT WITH PELVIS 2-3 VIEWS RIGHT; Future - DG Lumbar Spine Complete; Future  4. Need for pneumococcal vaccination Prevnar 13 given today.  HPI, Exam and A&P transcribed under direction and in the presence of Miguel Aschoff, MD. I have done the exam and reviewed the chart and it is accurate to the best of my knowledge. Development worker, community has been used and  any errors in  dictation or transcription are unintentional. Miguel Aschoff M.D. [facility]

## 2017-01-04 ENCOUNTER — Other Ambulatory Visit: Payer: Self-pay

## 2017-01-04 MED ORDER — OSELTAMIVIR PHOSPHATE 75 MG PO CAPS: 75.0000 mg | ORAL_CAPSULE | Freq: Two times a day (BID) | ORAL | 0 refills | Status: DC

## 2017-01-05 ENCOUNTER — Ambulatory Visit: Payer: PRIVATE HEALTH INSURANCE | Admitting: Family Medicine

## 2017-01-05 ENCOUNTER — Ambulatory Visit: Payer: PRIVATE HEALTH INSURANCE | Attending: Family Medicine

## 2017-01-05 DIAGNOSIS — M6281 Muscle weakness (generalized): Secondary | ICD-10-CM | POA: Insufficient documentation

## 2017-01-05 DIAGNOSIS — M5441 Lumbago with sciatica, right side: Secondary | ICD-10-CM | POA: Insufficient documentation

## 2017-01-05 DIAGNOSIS — M25651 Stiffness of right hip, not elsewhere classified: Secondary | ICD-10-CM | POA: Diagnosis present

## 2017-01-05 NOTE — Therapy (Signed)
[place] MAIN [facility] SERVICES [address] [place], [place], [address] Phone: [phone]   Fax:  [phone]  Physical Therapy Evaluation  Patient Details  Name: TANETTE CHAUCA MRN: [id number] Date of Birth: [birth year] Referring Provider: Dr.Gilbert  Encounter Date: 01/05/2017      PT End of Session - 01/05/17 1234    Visit Number 1   Number of Visits 12   Date for PT Re-Evaluation 02/16/17   PT Start Time 3419   PT Stop Time 1145   PT Time Calculation (min) 65 min   Activity Tolerance Patient tolerated treatment well   Behavior During Therapy [facility] for tasks assessed/performed      Past Medical History:  Diagnosis Date  . UTI (lower urinary tract infection)     Past Surgical History:  Procedure Laterality Date  . TONSILLECTOMY    . TUBAL LIGATION      There were no vitals filed for this visit.       Subjective Assessment - 01/05/17 1050    Subjective Patient presents with R sided low back pain with radiating down the R leg into the foot. Pain is worse in the am and has to "loosen up" to decrease pain. Pain when first rising in the morning in 3/10. Patient reports the pain in improving since the onset of symptoms. Patient states she's stop doing a lot of activities secondary.  Patient reports pain is worse with prolonged walking and standing up straight. Patient reports medication and heat decreases pain. Patient reports she the worse pain in past week was a 5/10 and the best the pain in the past week is a 0/10.     Pertinent History Hx of back pain, vagal nerve sensitivity (passing out)   Diagnostic tests X-Ray: Anterolisthesis l4-5   Currently in Pain? Yes   Pain Score 2    Pain Location Back   Pain Orientation Right   Pain Descriptors / Indicators Aching;Tender;Numbness   Pain Type Acute pain   Pain Radiating Towards Down toward the R foot   Pain Onset More than a month ago   Pain Frequency Intermittent             [facility] PT Assessment - 01/05/17 1044      Assessment   Medical Diagnosis Low back pain with R sided sciatica   Referring Provider Dr.Gilbert   Onset Date/Surgical Date 09/09/17   Hand Dominance Right   Next MD Visit unknown   Prior Therapy none     Restrictions   Weight Bearing Restrictions No     Balance Screen   Has the patient fallen in the past 6 months No   Has the patient had a decrease in activity level because of a fear of falling?  Yes   Is the patient reluctant to leave their home because of a fear of falling?  No     Home Environment   Living Environment Private residence   Living Arrangements Spouse/significant other   Available Help at [facility] to enter   Entrance Stairs-Number of Steps 8   Entrance Stairs-Rails Right   Home Layout Two level   Alternate Level Stairs-Number of Steps 5   Alternate Level Stairs-Rails Left     Prior Function   Level of Independence Independent   Vocation Full time employment   Vocation Requirements Walking, lifitng, sitting with computer work   Leisure Walking, zumba, swim, yard work  Cognition   Overall Cognitive Status Within Functional Limits for tasks assessed     ROM / Strength   AROM / PROM / Strength AROM;Strength     AROM   AROM Assessment Site Lumbar   Lumbar Flexion WNL   Lumbar Extension 90% limited   Lumbar - Right Side Bend 33% limited   Lumbar - Left Side Bend WNL   Lumbar - Right Rotation 10% limited   Lumbar - Left Rotation WNL     Strength   Strength Assessment Site Hip;Knee;Ankle   Right/Left Hip Right;Left   Right Hip Flexion 4/5   Right Hip Extension 4/5   Right Hip ABduction 3-/5   Right Hip ADduction 4/5   Left Hip Flexion 4/5   Left Hip Extension 4/5   Left Hip ABduction 3-/5   Left Hip ADduction 4/5   Right/Left Knee Right;Left   Right Knee Flexion 4/5   Right Knee Extension 4+/5   Left Knee Flexion 4-/5   Left Knee Extension 4/5   Right/Left Ankle  Right;Left   Right Ankle Dorsiflexion 4/5   Right Ankle Plantar Flexion 3+/5   Left Ankle Dorsiflexion 4/5   Left Ankle Plantar Flexion 3+/5     Special Tests    Special Tests Lumbar   Lumbar Tests Straight Leg Raise     Straight Leg Raise   Findings Positive   Side  Right      Observation: Gait: Patient ambulates in slight lumbar extension throughout gait cycle  Hip assessment: unable to assess hip flexion in supine secondary to increased pain with raising the knee to chest; Patient demonstrates increased reproduction of pain with hip ext on the L side (patient's symptoms are located on the R side)  Lumbar Assessment: Spring testing L1-S1 Grade 4 mobilizations: hypomobile with no reproduction of pain/symptoms; Increased symptoms with performing lumbar extension in standing; improved motion after performing prone extension  Repeated extension in prone -- x10; resulted in improvement in lumbar motion  LEFS: 63/80  TREATMENT:  LTR's -- x10 B; Prone extensions in prone -- x 20         PT Education - 01/05/17 1234    Education provided Yes   Education Details HEP: Prone press ups, LTRS   Person(s) Educated Patient   Methods Explanation;Demonstration   Comprehension Verbalized understanding;Returned demonstration             PT Long Term Goals - 01/05/17 1250      PT LONG TERM GOAL #1   Title Pt will be independent with HEP to demonstrate improvements after discharge from PT   Baseline Dependent with form/technique   Time 6   Period Weeks   Status New     PT LONG TERM GOAL #2   Title Patient will improve LEFS score to >75/80 to demonstrate significant improvement in LE and lumbar functioning.    Baseline LEFS: 63/80   Time 6   Period Weeks   Status New     PT LONG TERM GOAL #3   Title Patient will be able to stand for 2 hours without onset of pain to demonstrate significant improvement in lumbar and LE function.    Baseline patient able to stand for 10  min   Time 6   Period Weeks   Status New     PT LONG TERM GOAL #4   Title Patient will have full AROM; equal to the L side to demonstrate significant improvement in lumbar functioning   Baseline See  eval notes for AROM   Time 6   Period Weeks   Status New               Plan - 01/05/17 1238    Clinical Impression Statement Pt is a 66 yo right hand dominant female presenting with increased back pain with radiating symptoms down the R LE and Lumbar dysfunction. Patient's lumbar dysfunction is indicated by increased LEFS score, increased SLR neuro tension testing and increased pain with hip abduction. Pt will benefit from further skilled therapy to return to prior level of function.    Rehab Potential Good   Clinical Impairments Affecting Rehab Potential (+)family/friend support; (-) age, chronicity of condition   PT Frequency 2x / week   PT Duration 6 weeks   PT Treatment/Interventions Patient/family education;Therapeutic activities;Therapeutic exercise;Balance training;ADLs/Self Care Home Management;Electrical Stimulation;Stair training;Neuromuscular re-education;Functional mobility training;Manual techniques;Traction;Moist Heat;Cryotherapy;Ultrasound;Passive range of motion;Dry needling   PT Next Visit Plan Improve lumbar mobility, core stab   PT Home Exercise Plan LTRS, prone press ups   Consulted and Agree with Plan of Care Patient      Patient will benefit from skilled therapeutic intervention in order to improve the following deficits and impairments:  Decreased mobility, Decreased coordination, Decreased endurance, Decreased strength, Difficulty walking, Decreased activity tolerance, Increased muscle spasms, Postural dysfunction, Improper body mechanics, Increased fascial restricitons, Decreased balance, Abnormal gait  Visit Diagnosis: Right-sided low back pain with right-sided sciatica, unspecified chronicity  Stiffness of right hip, not elsewhere classified  Muscle  weakness (generalized)     Problem List Patient Active Problem List   Diagnosis Date Noted  . Insomnia, persistent 07/14/2015  . Abnormal liver enzymes 07/14/2015  . HLD (hyperlipidemia) 07/14/2015  . Osteopenia 07/14/2015  . Cyst of ovary 07/14/2015  . Avitaminosis D 07/14/2015  . Chest pain 03/10/2011    Blythe Stanford, PT DPT 01/05/2017, 1:15 PM  [place] MAIN [facility] SERVICES [address] [place], [place], [address] Phone: [phone]   Fax:  [phone]  Name: HAIZEL GATCHELL MRN: [id number] Date of Birth: Jan 28, 1951

## 2017-01-05 NOTE — Addendum Note (Signed)
Addended by: Blain Pais on: 01/05/2017 01:29 PM   Modules accepted: Orders

## 2017-01-09 ENCOUNTER — Ambulatory Visit: Payer: PRIVATE HEALTH INSURANCE | Attending: Family Medicine

## 2017-01-09 DIAGNOSIS — M5441 Lumbago with sciatica, right side: Secondary | ICD-10-CM | POA: Diagnosis not present

## 2017-01-09 DIAGNOSIS — M25651 Stiffness of right hip, not elsewhere classified: Secondary | ICD-10-CM | POA: Diagnosis present

## 2017-01-09 DIAGNOSIS — M6281 Muscle weakness (generalized): Secondary | ICD-10-CM | POA: Insufficient documentation

## 2017-01-09 NOTE — Therapy (Signed)
[place] MAIN [facility] SERVICES [address] [place], [place], [address] Phone: [phone]   Fax:  [phone]  Physical Therapy Treatment  Patient Details  Name: Mary [name] MRN: [id number] Date of Birth: 11-Nov-1950 Referring Provider: Dr.Gilbert  Encounter Date: 01/09/2017      PT End of Session - 01/09/17 1546    Visit Number 2   Number of Visits 12   Date for PT Re-Evaluation 02/16/17   PT Start Time 7782   PT Stop Time 1600   PT Time Calculation (min) 45 min   Activity Tolerance Patient tolerated treatment well   Behavior During Therapy [facility] for tasks assessed/performed      Past Medical History:  Diagnosis Date  . UTI (lower urinary tract infection)     Past Surgical History:  Procedure Laterality Date  . TONSILLECTOMY    . TUBAL LIGATION      There were no vitals filed for this visit.      Subjective Assessment - 01/09/17 1525    Subjective Patient reports performing the prone extensions help alleviate the pain in her back. Pt states her pain is improving.    Pertinent History Hx of back pain, vagal nerve sensitivity (passing out)   Diagnostic tests X-Ray: Anterolisthesis l4-5   Currently in Pain? Yes   Pain Score 2    Pain Location Back   Pain Descriptors / Indicators Aching;Tender   Pain Type Acute pain   Pain Onset More than a month ago   Pain Frequency Intermittent      TREATMENT: Therapeutic Exercises Bridges in hookyling - x 15 with cueing on activating TrA Sidelying hip abduction - 2 x 20 Knees to chest on the R side - x20 Multifidus Crunch in sitting - x 25 with TC and VCs Clamshells in sidelying - 2 x 20 on the R side LTR - 1 min  Hike hiking off of 6 in step - x 20 B  Mini squatting in  bars - x 20  Long axis hip pull while lying supine - 5 bouts of 30sec holds  Pain abolished after performing long axis hip pulls       PT Education - 01/09/17 1528    Education provided Yes   Education  Details Form/technique with exercises   Person(s) Educated Patient   Methods Explanation;Demonstration   Comprehension Verbalized understanding;Returned demonstration             PT Long Term Goals - 01/05/17 1250      PT LONG TERM GOAL #1   Title Pt will be independent with HEP to demonstrate improvements after discharge from PT   Baseline Dependent with form/technique   Time 6   Period Weeks   Status New     PT LONG TERM GOAL #2   Title Patient will improve LEFS score to >75/80 to demonstrate significant improvement in LE and lumbar functioning.    Baseline LEFS: 63/80   Time 6   Period Weeks   Status New     PT LONG TERM GOAL #3   Title Patient will be able to stand for 2 hours without onset of pain to demonstrate significant improvement in lumbar and LE function.    Baseline patient able to stand for 10 min   Time 6   Period Weeks   Status New     PT LONG TERM GOAL #4   Title Patient will have full AROM; equal to the L side to demonstrate  significant improvement in lumbar functioning   Baseline See eval notes for AROM   Time 6   Period Weeks   Status New               Plan - 01/09/17 1611    Clinical Impression Statement Patient demonstrates significant improvement in lumbar dysfunction most noteably after performing L leg distraction exercise indicating hip/lumbar dysfunction. Continued to progress lumbar and hip stabilization exercises and patient will benefit from further skilled therapy to return to prior level of function.    Rehab Potential Good   Clinical Impairments Affecting Rehab Potential (+)family/friend support; (-) age, chronicity of condition   PT Frequency 2x / week   PT Duration 6 weeks   PT Treatment/Interventions Patient/family education;Therapeutic activities;Therapeutic exercise;Balance training;ADLs/Self Care Home Management;Electrical Stimulation;Stair training;Neuromuscular re-education;Functional mobility training;Manual  techniques;Traction;Moist Heat;Cryotherapy;Ultrasound;Passive range of motion;Dry needling   PT Next Visit Plan Improve lumbar mobility, core stab   PT Home Exercise Plan LTRS, prone press ups   Consulted and Agree with Plan of Care Patient      Patient will benefit from skilled therapeutic intervention in order to improve the following deficits and impairments:  Decreased mobility, Decreased coordination, Decreased endurance, Decreased strength, Difficulty walking, Decreased activity tolerance, Increased muscle spasms, Postural dysfunction, Improper body mechanics, Increased fascial restricitons, Decreased balance, Abnormal gait  Visit Diagnosis: Right-sided low back pain with right-sided sciatica, unspecified chronicity  Stiffness of right hip, not elsewhere classified  Muscle weakness (generalized)     Problem List Patient Active Problem List   Diagnosis Date Noted  . Insomnia, persistent 07/14/2015  . Abnormal liver enzymes 07/14/2015  . HLD (hyperlipidemia) 07/14/2015  . Osteopenia 07/14/2015  . Cyst of ovary 07/14/2015  . Avitaminosis D 07/14/2015  . Chest pain 03/10/2011    Blythe Stanford, PT DPT 01/09/2017, 4:49 PM  [place] MAIN [facility] SERVICES [address] [place], [place], [address] Phone: [phone]   Fax:  [phone]  Name: Mary [name] MRN: [id number] Date of Birth: 01-Mar-1951

## 2017-01-12 ENCOUNTER — Ambulatory Visit: Payer: PRIVATE HEALTH INSURANCE

## 2017-01-16 ENCOUNTER — Ambulatory Visit: Payer: PRIVATE HEALTH INSURANCE | Attending: Family Medicine

## 2017-01-16 DIAGNOSIS — M6281 Muscle weakness (generalized): Secondary | ICD-10-CM | POA: Diagnosis present

## 2017-01-16 DIAGNOSIS — M25651 Stiffness of right hip, not elsewhere classified: Secondary | ICD-10-CM | POA: Insufficient documentation

## 2017-01-16 DIAGNOSIS — M5441 Lumbago with sciatica, right side: Secondary | ICD-10-CM | POA: Diagnosis not present

## 2017-01-16 NOTE — Therapy (Signed)
[place] MAIN [facility] SERVICES [address] [place], [place], [address] Phone: [phone]   Fax:  [phone]  Physical Therapy Treatment  Patient Details  Name: Mary [name] MRN: [id number] Date of Birth: [birth year] Referring Provider: Dr.Gilbert  Encounter Date: 01/16/2017      PT End of Session - 01/16/17 1515    Visit Number 3   Number of Visits 12   Date for PT Re-Evaluation 02/16/17   PT Start Time 1430   PT Stop Time 1515   PT Time Calculation (min) 45 min   Activity Tolerance Patient tolerated treatment well   Behavior During Therapy [facility] for tasks assessed/performed      Past Medical History:  Diagnosis Date  . UTI (lower urinary tract infection)     Past Surgical History:  Procedure Laterality Date  . TONSILLECTOMY    . TUBAL LIGATION      There were no vitals filed for this visit.      Subjective Assessment - 01/16/17 1452    Subjective Patient reports she's been performing exercises but has had an exaccerbation in pain after mowing her yard.    Pertinent History Hx of back pain, vagal nerve sensitivity (passing out)   Diagnostic tests X-Ray: Anterolisthesis l4-5   Currently in Pain? Yes   Pain Score 1    Pain Location Back   Pain Orientation Right   Pain Descriptors / Indicators Aching;Tender   Pain Type Acute pain   Pain Onset More than a month ago       TREATMENT: Therapeutic Exercises  Knees to chest on the B sides with physioball - x2 min Multifidus Crunch in sitting - x 25 with TC and VCs Weighted box lifts -- 30# x15 LTR - 1 min; LTR on physioball -- x 55min Prone press ups -- 2 x 15 Prone hip extension under a pillow in prone -- x 20 Seated Slump sciatic nerve flossing -- x 20  Manual therapy  Long axis hip pull while lying supine - 5 bouts of 30sec holds; P->A T10-L3 -- x 30sec grade IV to improve mobility and decrease pain       PT Education - 01/16/17 1513    Education provided Yes   Education Details Form/technique with exercises    Person(s) Educated Patient   Methods Explanation;Demonstration   Comprehension Verbalized understanding;Returned demonstration             PT Long Term Goals - 01/05/17 1250      PT LONG TERM GOAL #1   Title Pt will be independent with HEP to demonstrate improvements after discharge from PT   Baseline Dependent with form/technique   Time 6   Period Weeks   Status New     PT LONG TERM GOAL #2   Title Patient will improve LEFS score to >75/80 to demonstrate significant improvement in LE and lumbar functioning.    Baseline LEFS: 63/80   Time 6   Period Weeks   Status New     PT LONG TERM GOAL #3   Title Patient will be able to stand for 2 hours without onset of pain to demonstrate significant improvement in lumbar and LE function.    Baseline patient able to stand for 10 min   Time 6   Period Weeks   Status New     PT LONG TERM GOAL #4   Title Patient will have full AROM; equal to the L side to demonstrate significant improvement  in lumbar functioning   Baseline See eval notes for AROM   Time 6   Period Weeks   Status New               Plan - 01/16/17 1537    Clinical Impression Statement Patient demonstrates increased lumbar symptoms today, and focused on performing lubar mobility and stabilization exercises to improve pain and spasms. Patient demonstrates improvement in pain and lumbatr mobility after performing manual therapy such as P->A's and long axis hip pull. Patient will benefit from further skilled therapy focused on improving lumbar mobility and stabilization to return to prior level of function.    Rehab Potential Good   Clinical Impairments Affecting Rehab Potential (+)family/friend support; (-) age, chronicity of condition   PT Frequency 2x / week   PT Duration 6 weeks   PT Treatment/Interventions Patient/family education;Therapeutic activities;Therapeutic exercise;Balance training;ADLs/Self Care  Home Management;Electrical Stimulation;Stair training;Neuromuscular re-education;Functional mobility training;Manual techniques;Traction;Moist Heat;Cryotherapy;Ultrasound;Passive range of motion;Dry needling   PT Next Visit Plan Improve lumbar mobility, core stab   PT Home Exercise Plan LTRS, prone press ups   Consulted and Agree with Plan of Care Patient      Patient will benefit from skilled therapeutic intervention in order to improve the following deficits and impairments:  Decreased mobility, Decreased coordination, Decreased endurance, Decreased strength, Difficulty walking, Decreased activity tolerance, Increased muscle spasms, Postural dysfunction, Improper body mechanics, Increased fascial restricitons, Decreased balance, Abnormal gait  Visit Diagnosis: Right-sided low back pain with right-sided sciatica, unspecified chronicity  Stiffness of right hip, not elsewhere classified  Muscle weakness (generalized)     Problem List Patient Active Problem List   Diagnosis Date Noted  . Insomnia, persistent 07/14/2015  . Abnormal liver enzymes 07/14/2015  . HLD (hyperlipidemia) 07/14/2015  . Osteopenia 07/14/2015  . Cyst of ovary 07/14/2015  . Avitaminosis D 07/14/2015  . Chest pain 03/10/2011    Blythe Stanford, PT DPT 01/16/2017, 3:45 PM  [place] MAIN [facility] SERVICES [address] [place], [place], [address] Phone: [phone]   Fax:  [phone]  Name: Mary [name] MRN: [id number] Date of Birth: September 11, 1951

## 2017-01-19 ENCOUNTER — Ambulatory Visit: Payer: PRIVATE HEALTH INSURANCE | Admitting: Physical Therapy

## 2017-01-19 ENCOUNTER — Encounter: Payer: Self-pay | Admitting: Physical Therapy

## 2017-01-19 DIAGNOSIS — M5441 Lumbago with sciatica, right side: Secondary | ICD-10-CM | POA: Diagnosis not present

## 2017-01-19 DIAGNOSIS — M6281 Muscle weakness (generalized): Secondary | ICD-10-CM

## 2017-01-19 DIAGNOSIS — M25651 Stiffness of right hip, not elsewhere classified: Secondary | ICD-10-CM

## 2017-01-19 NOTE — Patient Instructions (Addendum)
Posture - Sitting    Sit upright, head facing forward. Try using a towel roll to support lower back. Keep shoulders relaxed, and avoid rounded back. Keep hips level with knees. Avoid crossing legs for long periods.   Copyright  VHI. All rights reserved.  Pelvic Anterior / Posterior Tilt / Pelvic Rock    Stand or sit with upper back and buttocks touching wall,  A.Anterior tilt: Raise chest and arch back slightly. Breathe In B.Posterior tilt: Contract abdominals and flatten back. Tennille __10__ times per session. Do ___2_ sessions per week. Progression: Perform pelvic tilt away from wall.  Copyright  VHI. All rights reserved.  Stretching: Piriformis    Sitting in chair, cross right ankle over left knee, using left hand, pull right knee up towards left shoulder.  Hold _15___ seconds. Repeat _2-3___ times per set. Do __2__ sets per session. Do __2__ sessions per day.  http://orth.exer.us/651     Standing Arch (Extension)   Place hands in small of back. Using hands as fulcrum, arch backward. (Or you could lean against hood of car) Try to keep knees straight. Avoid painful range of motion; slowly come back to neutral (avoid bending forward) Use to break up long periods of sitting. Repeat _10___ times. Do _1-2___ sessions per day.  http://gt2.exer.us/247

## 2017-01-19 NOTE — Therapy (Signed)
[place] MAIN [facility] SERVICES [address] [place], [place], [address] Phone: [phone]   Fax:  [phone]  Physical Therapy Treatment  Patient Details  Name: Mary [name] MRN: [id number] Date of Birth: [birth year] Referring Provider: Dr.Gilbert  Encounter Date: 01/19/2017      PT End of Session - 01/19/17 1350    Visit Number 4   Number of Visits 12   Date for PT Re-Evaluation 02/16/17   PT Start Time 1310   PT Stop Time 6546   PT Time Calculation (min) 38 min   Activity Tolerance Patient tolerated treatment well;No increased pain   Behavior During Therapy [facility] for tasks assessed/performed      Past Medical History:  Diagnosis Date  . UTI (lower urinary tract infection)     Past Surgical History:  Procedure Laterality Date  . TONSILLECTOMY    . TUBAL LIGATION      There were no vitals filed for this visit.      Subjective Assessment - 01/19/17 1317    Subjective Patient reports no pain currently; However she reports increased discomfort this morning. She states, "I felt a lot of pain this morning. I forgot to do my exercises last night and I can really tell a difference."    Pertinent History Hx of back pain, vagal nerve sensitivity (passing out)   Diagnostic tests X-Ray: Anterolisthesis l4-5   Currently in Pain? No/denies   Pain Onset More than a month ago        TREATMENT:  Patient prone: Press up on hands x10 reps with cues to keep hips on table for better lumbar extension; Press up on hands with therapist PA mobs to central low back to facilitate better lumbar extension 3 sets of 10, patient reports less pain following press ups;  Prone: Alternate hip extension 2x10 with cues to avoid trunk rotation for better hip strengthening; RLE glute max hip extension 2x10 with min Vcs for positioning to improve hip strengthening;  Patient expressed concern over car trip; Seated: Anterior/posterior pelvic rocks x10 reps  with cues to reduce upper trunk movement and isolate lumbar movement; RLE Piriformis stretch 15 sec hold x2; Educated patient in using towel roll for lumbar positioning to reduce slumped posture;  Standing: lumbar extension x10 reps with cues to do standing lumbar extension regularly when taking breaks on car trip;  Advanced HEP- see patient instructions;  Patient reports no pain at end of treatment session                          PT Education - 01/19/17 1317    Education provided Yes   Education Details exercise technique, HEP reinforced;    Person(s) Educated Patient   Methods Explanation;Demonstration;Verbal cues   Comprehension Verbalized understanding;Returned demonstration;Verbal cues required             PT Long Term Goals - 01/05/17 1250      PT LONG TERM GOAL #1   Title Pt will be independent with HEP to demonstrate improvements after discharge from PT   Baseline Dependent with form/technique   Time 6   Period Weeks   Status New     PT LONG TERM GOAL #2   Title Patient will improve LEFS score to >75/80 to demonstrate significant improvement in LE and lumbar functioning.    Baseline LEFS: 63/80   Time 6   Period Weeks   Status New  PT LONG TERM GOAL #3   Title Patient will be able to stand for 2 hours without onset of pain to demonstrate significant improvement in lumbar and LE function.    Baseline patient able to stand for 10 min   Time 6   Period Weeks   Status New     PT LONG TERM GOAL #4   Title Patient will have full AROM; equal to the L side to demonstrate significant improvement in lumbar functioning   Baseline See eval notes for AROM   Time 6   Period Weeks   Status New               Plan - 01/19/17 1350    Clinical Impression Statement Patient continues to respond well to lumbar extension exercise. Advanced exercise with gentle PA mobs during prone press ups for better extension. Patient expressed concern over  going on a trip, riding the car for 5 hours. Instructed patient in seated exercise to improve lumbar mobility and reduce stiffness. She would beneft from additional skilled PT intervention to improve strength, balance and gait safety;    Rehab Potential Good   Clinical Impairments Affecting Rehab Potential (+)family/friend support; (-) age, chronicity of condition   PT Frequency 2x / week   PT Duration 6 weeks   PT Treatment/Interventions Patient/family education;Therapeutic activities;Therapeutic exercise;Balance training;ADLs/Self Care Home Management;Electrical Stimulation;Stair training;Neuromuscular re-education;Functional mobility training;Manual techniques;Traction;Moist Heat;Cryotherapy;Ultrasound;Passive range of motion;Dry needling   PT Next Visit Plan Improve lumbar mobility, core stab   PT Home Exercise Plan advanced- see patient instructions;    Consulted and Agree with Plan of Care Patient      Patient will benefit from skilled therapeutic intervention in order to improve the following deficits and impairments:  Decreased mobility, Decreased coordination, Decreased endurance, Decreased strength, Difficulty walking, Decreased activity tolerance, Increased muscle spasms, Postural dysfunction, Improper body mechanics, Increased fascial restricitons, Decreased balance, Abnormal gait  Visit Diagnosis: Right-sided low back pain with right-sided sciatica, unspecified chronicity  Stiffness of right hip, not elsewhere classified  Muscle weakness (generalized)     Problem List Patient Active Problem List   Diagnosis Date Noted  . Insomnia, persistent 07/14/2015  . Abnormal liver enzymes 07/14/2015  . HLD (hyperlipidemia) 07/14/2015  . Osteopenia 07/14/2015  . Cyst of ovary 07/14/2015  . Avitaminosis D 07/14/2015  . Chest pain 03/10/2011    Raeli Wiens PT, DPT 01/19/2017, 1:52 PM  [place] MAIN [facility] SERVICES [address]  [place], [place], [address] Phone: [phone]   Fax:  [phone]  Name: Mary [name] MRN: [id number] Date of Birth: 1951-03-14

## 2017-01-24 ENCOUNTER — Encounter: Payer: Self-pay | Admitting: Family Medicine

## 2017-01-24 ENCOUNTER — Ambulatory Visit (INDEPENDENT_AMBULATORY_CARE_PROVIDER_SITE_OTHER): Payer: PRIVATE HEALTH INSURANCE | Admitting: Family Medicine

## 2017-01-24 VITALS — BP 104/60 | HR 68 | Temp 97.5°F | Resp 16 | Wt 186.0 lb

## 2017-01-24 DIAGNOSIS — M545 Low back pain, unspecified: Secondary | ICD-10-CM

## 2017-01-24 DIAGNOSIS — E784 Other hyperlipidemia: Secondary | ICD-10-CM | POA: Diagnosis not present

## 2017-01-24 DIAGNOSIS — E7849 Other hyperlipidemia: Secondary | ICD-10-CM

## 2017-01-24 NOTE — Progress Notes (Signed)
Patient: Mary [name] Female    DOB: 1951-01-24   66 y.o.   MRN: [id number] Visit Date: 01/24/2017  Today's Provider: Wilhemena Durie, MD   Chief Complaint  Patient presents with  . Hyperlipidemia  . Back Pain   Subjective:    HPI      Lipid/Cholesterol, Follow-up:   Last seen for this over 1 year ago.  Management changes since that visit include advising pt to increase diet and exercise. . Last Lipid Panel:    Component Value Date/Time   CHOL 204 (H) 08/13/2015 1150   TRIG 139 08/13/2015 1150   HDL 57 08/13/2015 1150   CHOLHDL 3.6 08/13/2015 1150   VLDL 28 08/13/2015 1150   LDLCALC 119 (H) 08/13/2015 1150    She reports fair compliance with treatment. Pt reports she walks about 2 miles daily. She does admit she has not been dieting. Weight trend: increasing steadily Current diet: well balanced Current exercise: walking  Wt Readings from Last 3 Encounters:  01/24/17 186 lb (84.4 kg)  12/26/16 184 lb (83.5 kg)  10/31/16 183 lb (83 kg)    -------------------------------------------------------------------  Follow up for Back Pain  The patient was last seen for this 4 weeks ago. Changes made at last visit include adding Flexeril and referring to PT. Would consider adding Prednisone if not improved at FU.  She reports excellent compliance with treatment. She feels that condition is Unchanged. Pt states the PT has allowed her ROM to improve, but the pain is still present. Pain is currently a 1/10 on a pain. Pain is exacerbated by sitting/laying down for too long. Pt is performing exercises to help the stiffness. Pain is also present during exercises.   ------------------------------------------------------------------------------------    Allergies  Allergen Reactions  . Amoxicillin Rash  . Hydrocodone-Acetaminophen Rash     Current Outpatient Prescriptions:  .  conjugated estrogens (PREMARIN) vaginal cream, Place 1 Applicatorful vaginally  daily., Disp: 42.5 g, Rfl: 12 .  cyclobenzaprine (FLEXERIL) 10 MG tablet, Take 1 tablet (10 mg total) by mouth at bedtime., Disp: 30 tablet, Rfl: 1 .  latanoprost (XALATAN) 0.005 % ophthalmic solution, Apply to eye., Disp: , Rfl:  .  mometasone (ELOCON) 0.1 % cream, Apply topically daily. Apply to affected area.., Disp: 45 g, Rfl: 0 .  naproxen (NAPROSYN) 500 MG tablet, Take 1 tablet (500 mg total) by mouth 2 (two) times daily with a meal., Disp: 60 tablet, Rfl: 1 .  temazepam (RESTORIL) 30 MG capsule, Take by mouth., Disp: , Rfl:   Review of Systems  Constitutional: Positive for activity change (due to back pain). Negative for appetite change, chills, diaphoresis, fatigue, fever and unexpected weight change.  Respiratory: Negative for shortness of breath.   Cardiovascular: Negative for chest pain, palpitations and leg swelling.  Musculoskeletal: Positive for back pain.  Allergic/Immunologic: Negative.   Neurological: Negative.   Psychiatric/Behavioral: Negative.     Social History  Substance Use Topics  . Smoking status: Never Smoker  . Smokeless tobacco: Never Used  . Alcohol use 0.5 oz/week    1 Standard drinks or equivalent per week     Comment: 1 drink a day   Objective:   BP 104/60 (BP Location: Right Arm, Patient Position: Sitting, Cuff Size: Large)   Pulse 68   Temp 97.5 F (36.4 C) (Oral)   Resp 16   Wt 186 lb (84.4 kg)   BMI 30.02 kg/m  Vitals:   01/24/17 1600  BP: 104/60  Pulse: 68  Resp: 16  Temp: 97.5 F (36.4 C)  TempSrc: Oral  Weight: 186 lb (84.4 kg)     Physical Exam  Constitutional: She is oriented to person, place, and time. She appears well-developed and well-nourished.  HENT:  Head: Normocephalic and atraumatic.  Eyes: Conjunctivae are normal.  Neck: Neck supple.  Cardiovascular: Normal rate, regular rhythm and normal heart sounds.   Pulmonary/Chest: Effort normal and breath sounds normal.  Abdominal: Soft.  Musculoskeletal: She exhibits no  edema, tenderness or deformity.  Neurological: She is alert and oriented to person, place, and time. She displays normal reflexes. She exhibits normal muscle tone. Coordination normal.  Skin: Skin is warm and dry.  Psychiatric: She has a normal mood and affect. Her behavior is normal. Judgment and thought content normal.        Assessment & Plan:     Low Back Pain [facility]--try PT referral. Try Flexeril TID prn. HLD    Patient seen and examined by Miguel Aschoff, MD, and note scribed by Renaldo Fiddler, CMA.  [place] Ellithorpe Cranford Mon, MD  [facility]

## 2017-01-31 ENCOUNTER — Encounter: Payer: Self-pay | Admitting: Physical Therapy

## 2017-01-31 ENCOUNTER — Ambulatory Visit: Payer: PRIVATE HEALTH INSURANCE | Admitting: Physical Therapy

## 2017-01-31 DIAGNOSIS — M5441 Lumbago with sciatica, right side: Secondary | ICD-10-CM

## 2017-01-31 DIAGNOSIS — M6281 Muscle weakness (generalized): Secondary | ICD-10-CM

## 2017-01-31 DIAGNOSIS — M25651 Stiffness of right hip, not elsewhere classified: Secondary | ICD-10-CM

## 2017-01-31 NOTE — Therapy (Signed)
[place] MAIN [facility] SERVICES [address] [place], [place], [address] Phone: [phone]   Fax:  [phone]  Physical Therapy Treatment  Patient Details  Name: Mary [name] MRN: [id number] Date of Birth: 06/06/1951 Referring Provider: Dr.Gilbert  Encounter Date: 01/31/2017      PT End of Session - 01/31/17 1343    Visit Number 5   Number of Visits 12   Date for PT Re-Evaluation 02/16/17   PT Start Time 3546   PT Stop Time 1345   PT Time Calculation (min) 40 min   Activity Tolerance Patient tolerated treatment well;No increased pain   Behavior During Therapy WFL for tasks assessed/performed      Past Medical History:  Diagnosis Date  . UTI (lower urinary tract infection)     Past Surgical History:  Procedure Laterality Date  . TONSILLECTOMY    . TUBAL LIGATION      There were no vitals filed for this visit.      Subjective Assessment - 01/31/17 1312    Subjective Patient reports that she had less pain on her trip with towel roll behind back. She reports that she tired some exercise in the pool with good results. She joined the [employer] so that she can use their pool. She reports that the days that her pain is bad, its not as bad; She reports a little pain this morning and use topical pain relief cream without pain pill and reports no pain currently;    Pertinent History Hx of back pain, vagal nerve sensitivity (passing out)   Diagnostic tests X-Ray: Anterolisthesis l4-5   Currently in Pain? No/denies   Pain Onset More than a month ago           TREATMENT:  Patient prone: Press up on hands x10 reps with cues to keep hips on table for better lumbar extension; Press up on hands with therapist PA mobs to central low back to facilitate better lumbar extension 3 sets of 10, patient reports less pain following press ups;  Prone: Grade II-III PA mobs to lumbar spine L1, 2, 3, 4, 5, 10 sec bouts x2 sets each;  Alternate knee  flexion x10 bilaterally with cues to slow down LE movement and to increase ROM for better stretch into thigh; Alternate hip extension 2x10 with cues to avoid trunk rotation for better hip strengthening; glute max hip extension x12 bilaterally with min Vcs for positioning to improve hip strengthening;  Patient reports slight increase in right PSIS with prone exercise. PT assessed sacral/pelvic positioning and noted that right PSIS is more prominent than left and is hypomobile. Instructed patient in long sit test, with RLE going short to long indicating possible posterior innominate rotation;  Patient hooklying; RLE resisted hip flexion isometric with manual resistance 5 sec hold x5;  Patient left sidelying: Contract/relax Right resisted hip flexion with grade II-III anterior rotation of right pelvic innominate 2 sets of 5; Patient reports no pain at end of exercise. She rolled into sitting and walked 100 feet without pain in right hip;   Advanced HEP- see patient instructions;  Patient reports no pain at end of treatment session                         PT Education - 01/31/17 1441    Education provided Yes   Education Details HEP reinforced, exercise posture/positioning   Person(s) Educated Patient   Methods Explanation;Demonstration;Verbal cues   Comprehension  Verbalized understanding;Returned demonstration;Verbal cues required             PT Long Term Goals - 01/05/17 1250      PT LONG TERM GOAL #1   Title Pt will be independent with HEP to demonstrate improvements after discharge from PT   Baseline Dependent with form/technique   Time 6   Period Weeks   Status New     PT LONG TERM GOAL #2   Title Patient will improve LEFS score to >75/80 to demonstrate significant improvement in LE and lumbar functioning.    Baseline LEFS: 63/80   Time 6   Period Weeks   Status New     PT LONG TERM GOAL #3   Title Patient will be able to stand for 2 hours  without onset of pain to demonstrate significant improvement in lumbar and LE function.    Baseline patient able to stand for 10 min   Time 6   Period Weeks   Status New     PT LONG TERM GOAL #4   Title Patient will have full AROM; equal to the L side to demonstrate significant improvement in lumbar functioning   Baseline See eval notes for AROM   Time 6   Period Weeks   Status New               Plan - 01/31/17 1441    Clinical Impression Statement Patient reports continued hip pain in the morning with increased stiffness. She presents to therapy without pain. PT performed manual therapy and instructed patient in LE exercise to facilitate lumbosacral movement. Patient reports increased discomfort along right PSIS. PT performed long sit test which was positive for possible posterior rotation. Instructed patient in resisted hip flexion on right side to facilitate anterior rotation; Patient reports no pain at end of session; She would benefit from additional skilled PT intervention to improve strength, mobility and reduce pain with ADLs;    Rehab Potential Good   Clinical Impairments Affecting Rehab Potential (+)family/friend support; (-) age, chronicity of condition   PT Frequency 2x / week   PT Duration 6 weeks   PT Treatment/Interventions Patient/family education;Therapeutic activities;Therapeutic exercise;Balance training;ADLs/Self Care Home Management;Electrical Stimulation;Stair training;Neuromuscular re-education;Functional mobility training;Manual techniques;Traction;Moist Heat;Cryotherapy;Ultrasound;Passive range of motion;Dry needling   PT Next Visit Plan Improve lumbar mobility, core stab   PT Home Exercise Plan advanced- see patient instructions;    Consulted and Agree with Plan of Care Patient      Patient will benefit from skilled therapeutic intervention in order to improve the following deficits and impairments:  Decreased mobility, Decreased coordination, Decreased  endurance, Decreased strength, Difficulty walking, Decreased activity tolerance, Increased muscle spasms, Postural dysfunction, Improper body mechanics, Increased fascial restricitons, Decreased balance, Abnormal gait  Visit Diagnosis: Right-sided low back pain with right-sided sciatica, unspecified chronicity  Stiffness of right hip, not elsewhere classified  Muscle weakness (generalized)     Problem List Patient Active Problem List   Diagnosis Date Noted  . Insomnia, persistent 07/14/2015  . Abnormal liver enzymes 07/14/2015  . HLD (hyperlipidemia) 07/14/2015  . Osteopenia 07/14/2015  . Cyst of ovary 07/14/2015  . Avitaminosis D 07/14/2015  . Chest pain 03/10/2011    Davyn Morandi PT, DPT 02/01/2017, 8:16 AM  [place] MAIN [facility] SERVICES [address] [place], [place], [address] Phone: [phone]   Fax:  [phone]  Name: Mary [name] MRN: [id number] Date of Birth: 04-07-1951

## 2017-01-31 NOTE — Patient Instructions (Addendum)
Knee to Chest - Sitting    Keeping back to chair, bring right knee toward chest push against knee with hands. Hold for 5 sec, repeat 10 times, do 2 x a day. Copyright  VHI. All rights reserved.  Knee to Chest (Flexion)    lift knee toward chest. Push hand against knee and  Hold __5__ seconds. Only do right leg;  Repeat _10___ times. Do __2__ sessions per day.  http://gt2.exer.us/226   Copyright  VHI. All rights reserved.

## 2017-02-07 ENCOUNTER — Ambulatory Visit: Payer: PRIVATE HEALTH INSURANCE | Attending: Family Medicine | Admitting: Physical Therapy

## 2017-02-07 ENCOUNTER — Encounter: Payer: Self-pay | Admitting: Physical Therapy

## 2017-02-07 DIAGNOSIS — M6281 Muscle weakness (generalized): Secondary | ICD-10-CM | POA: Insufficient documentation

## 2017-02-07 DIAGNOSIS — M5441 Lumbago with sciatica, right side: Secondary | ICD-10-CM | POA: Insufficient documentation

## 2017-02-07 DIAGNOSIS — M25651 Stiffness of right hip, not elsewhere classified: Secondary | ICD-10-CM | POA: Diagnosis present

## 2017-02-07 NOTE — Therapy (Signed)
[place] MAIN [facility] SERVICES [address] [place], [place], [address] Phone: [phone]   Fax:  [phone]  Physical Therapy Treatment  Patient Details  Name: Mary [name] MRN: [id number] Date of Birth: 1950-11-05 Referring Provider: Dr.Gilbert  Encounter Date: 02/07/2017      PT End of Session - 02/07/17 0818    Visit Number 6   Number of Visits 12   Date for PT Re-Evaluation 02/16/17   PT Start Time 0802   PT Stop Time 0845   PT Time Calculation (min) 43 min   Activity Tolerance Patient tolerated treatment well;No increased pain   Behavior During Therapy WFL for tasks assessed/performed      Past Medical History:  Diagnosis Date  . UTI (lower urinary tract infection)     Past Surgical History:  Procedure Laterality Date  . TONSILLECTOMY    . TUBAL LIGATION      There were no vitals filed for this visit.      Subjective Assessment - 02/07/17 0814    Subjective Patient reports, "I feel some better. The pain has moved. Its now lower on my right hip. It doesn't hurt in my back like it did, its in my lower hip." Patient reports that she felt more pain yesterday but is concerned that it could be from being busy with her grandkids over the weekend;    Pertinent History Hx of back pain, vagal nerve sensitivity (passing out)   Diagnostic tests X-Ray: Anterolisthesis l4-5   Currently in Pain? Yes   Pain Score 2    Pain Location Buttocks   Pain Orientation Right   Pain Descriptors / Indicators Aching;Tender   Pain Type Acute pain   Pain Onset More than a month ago   Pain Frequency Intermittent   Aggravating Factors  worse in the morning;    Pain Relieving Factors stretches, exercise, pain meds   Effect of Pain on Daily Activities decreased morning activity tolerance;         TREATMENT: Patient reports increased tenderness along right posterior hip (piriformis and glutes)  Long sit test negative with patient exhibiting  neutral hip position;  Patient prone: PT Performed grade III PA mobs to right proximal femoral joint 10 sec bouts x3 sets with bolster under knee to facilitate better hip mobility; Patient reports less discomfort following joint mobs reporting, "It feels better"  Patient in hoooklying: Green tband hip flexion x10 bilaterally with tactile cues to increase ROM for better strengthening; Modified piriformis stretch on right side 15 sec hold x2; Regular piriformis stretch on right side 15 sec hold x2; Patient required min VCs for correct positioning for LE stretches to reduce discomfort; Posterior pelvic rocks x10 with diaphragmatic breathing to facilitate better pelvic mobility; Right foot over left knee, single leg bridge x10 reps with min A to avoid hip abduction and cues to avoid painful ROM;   Left sidelying: Green tband hip abduction clamshells 2x15 with cues to avoid trunk rotation for better hip abductor strengthening; Patient denies any pain with clamshells but does report difficulty;   Leg press: BLE 105# 2x15 with cues to avoid arching back and slow down LE movement for better strengthening;   Standing calf stretch 15 sec hold x1 bilaterally with min VCs for correct posture/positioning to reduce discomfort;   Patient educated on stretches to do after exercise: Seated: Piriformis stretch 15 sec hold x2; Isometric hip flexion manual resistance 5 sec hold x5 reps;  Patient reports no pain  after treatment session;                       PT Education - 02/07/17 0817    Education provided Yes   Education Details HEP reinforced, exercise, stretch/strengthening;    Person(s) Educated Patient   Methods Explanation;Verbal cues   Comprehension Verbalized understanding;Returned demonstration;Verbal cues required             PT Long Term Goals - 01/05/17 1250      PT LONG TERM GOAL #1   Title Pt will be independent with HEP to demonstrate improvements after  discharge from PT   Baseline Dependent with form/technique   Time 6   Period Weeks   Status New     PT LONG TERM GOAL #2   Title Patient will improve LEFS score to >75/80 to demonstrate significant improvement in LE and lumbar functioning.    Baseline LEFS: 63/80   Time 6   Period Weeks   Status New     PT LONG TERM GOAL #3   Title Patient will be able to stand for 2 hours without onset of pain to demonstrate significant improvement in lumbar and LE function.    Baseline patient able to stand for 10 min   Time 6   Period Weeks   Status New     PT LONG TERM GOAL #4   Title Patient will have full AROM; equal to the L side to demonstrate significant improvement in lumbar functioning   Baseline See eval notes for AROM   Time 6   Period Weeks   Status New               Plan - 02/07/17 0847    Clinical Impression Statement Patient continues to respond well to manual therapy with less tightness after stretches/joint mobs. Instructed patient in advanced strengthening exercise as she was feeling less pain today; Patient requires min VCs for correct exercise technique. She reports no pain at end of treatment session; Educated patient in stretches to do after resisted exercise at gym to reduce soreness. Patient would benefit from additional skilled PT intervention to improve strength, ROM and reduce pain with ADLs.    Rehab Potential Good   Clinical Impairments Affecting Rehab Potential (+)family/friend support; (-) age, chronicity of condition   PT Frequency 2x / week   PT Duration 6 weeks   PT Treatment/Interventions Patient/family education;Therapeutic activities;Therapeutic exercise;Balance training;ADLs/Self Care Home Management;Electrical Stimulation;Stair training;Neuromuscular re-education;Functional mobility training;Manual techniques;Traction;Moist Heat;Cryotherapy;Ultrasound;Passive range of motion;Dry needling   PT Next Visit Plan Improve lumbar mobility, core stab   PT  Home Exercise Plan continue as given;    Consulted and Agree with Plan of Care Patient      Patient will benefit from skilled therapeutic intervention in order to improve the following deficits and impairments:  Decreased mobility, Decreased coordination, Decreased endurance, Decreased strength, Difficulty walking, Decreased activity tolerance, Increased muscle spasms, Postural dysfunction, Improper body mechanics, Increased fascial restricitons, Decreased balance, Abnormal gait  Visit Diagnosis: Right-sided low back pain with right-sided sciatica, unspecified chronicity  Stiffness of right hip, not elsewhere classified  Muscle weakness (generalized)     Problem List Patient Active Problem List   Diagnosis Date Noted  . Insomnia, persistent 07/14/2015  . Abnormal liver enzymes 07/14/2015  . HLD (hyperlipidemia) 07/14/2015  . Osteopenia 07/14/2015  . Cyst of ovary 07/14/2015  . Avitaminosis D 07/14/2015  . Chest pain 03/10/2011    Brevin Mcfadden PT, DPT 02/07/2017, 8:49 AM  [place] MAIN [facility] SERVICES [address] [place], [place], [address] Phone: [phone]   Fax:  [phone]  Name: Mary [name] MRN: [id number] Date of Birth: Jan 28, 1951

## 2017-02-09 ENCOUNTER — Encounter: Payer: Self-pay | Admitting: Physical Therapy

## 2017-02-09 ENCOUNTER — Ambulatory Visit: Payer: PRIVATE HEALTH INSURANCE | Admitting: Physical Therapy

## 2017-02-09 DIAGNOSIS — M5441 Lumbago with sciatica, right side: Secondary | ICD-10-CM

## 2017-02-09 DIAGNOSIS — M25651 Stiffness of right hip, not elsewhere classified: Secondary | ICD-10-CM

## 2017-02-09 DIAGNOSIS — M6281 Muscle weakness (generalized): Secondary | ICD-10-CM

## 2017-02-09 NOTE — Therapy (Signed)
[place] MAIN [facility] SERVICES [address] [place], [place], [address] Phone: [phone]   Fax:  [phone]  Physical Therapy Treatment  Patient Details  Name: Mary [name] MRN: [id number] Date of Birth: 1951/07/30 Referring Provider: Dr.Gilbert  Encounter Date: 02/09/2017      PT End of Session - 02/09/17 0814    Visit Number 7   Number of Visits 12   Date for PT Re-Evaluation 02/16/17   PT Start Time 0807   PT Stop Time 0846   PT Time Calculation (min) 39 min   Activity Tolerance Patient tolerated treatment well;No increased pain   Behavior During Therapy WFL for tasks assessed/performed      Past Medical History:  Diagnosis Date  . UTI (lower urinary tract infection)     Past Surgical History:  Procedure Laterality Date  . TONSILLECTOMY    . TUBAL LIGATION      There were no vitals filed for this visit.      Subjective Assessment - 02/09/17 0813    Subjective Patient reports, "The pain is getting lower. I feel more of a tired muscle soreness like I've worked it." She reports not feeling the sharp burning or tingling pain like she did before;    Pertinent History Hx of back pain, vagal nerve sensitivity (passing out)   Diagnostic tests X-Ray: Anterolisthesis l4-5   Currently in Pain? Yes   Pain Score 2    Pain Location Hip   Pain Orientation Right   Pain Descriptors / Indicators Aching;Sore   Pain Type Chronic pain   Pain Onset More than a month ago   Pain Frequency Intermittent   Aggravating Factors  worse in the morning;    Pain Relieving Factors stretches/exercise; pain meds   Effect of Pain on Daily Activities decreased activity tolerance in the morning;          TREATMENT: Patient reports mild tenderness along right posterior hip (piriformis and glutes)   Patient in hoooklying: Modified piriformis stretch on right side 15 sec hold x2; Regular piriformis stretch on right side 15 sec hold x2; Hamstring  neural stretch with knee flexion/extension 5 sec hold x5 bilaterally; Hamstring neural stretch with knee extended and ankle DF/PF x10 reps AROM bilaterally; Lumbar trunk rotation to left side only and back to midline x1 min gentle ROM;  Single knee to chest 15 sec hold x2 bilaterally; Patient required min VCs for correct positioning for LE stretches to reduce discomfort;  Green tband hip flexion 2x15 bilaterally with tactile cues to increase ROM for better strengthening;  Left sidelying: Green tband hip abduction clamshells 2x15 with cues to avoid trunk rotation for better hip abductor strengthening; Patient denies any pain with clamshells but does report difficulty;   Leg press: BLE 120# 2x12with cues to avoid arching back and slow down LE movement for better strengthening;  BLE heel raises 120# 2x12 with min Vcs to keep knees straight for better calf strengthening;   Side stepping with green tband around BLE x10 feet x2 laps each direction;  Diagonal stepping with green tband x10 feet forward/backward x2 sets each, supervision with min VCs for sequencing and foot placement;  Provided green tband for home use to improve HEP compliance; Patient denies any pain after treatment session;                      PT Education - 02/09/17 0814    Education provided Yes   Education Details  HEP reinforced, exercise, stretch/strengthening;    Person(s) Educated Patient   Methods Explanation;Verbal cues   Comprehension Verbalized understanding;Returned demonstration;Verbal cues required             PT Long Term Goals - 01/05/17 1250      PT LONG TERM GOAL #1   Title Pt will be independent with HEP to demonstrate improvements after discharge from PT   Baseline Dependent with form/technique   Time 6   Period Weeks   Status New     PT LONG TERM GOAL #2   Title Patient will improve LEFS score to >75/80 to demonstrate significant improvement in LE and lumbar  functioning.    Baseline LEFS: 63/80   Time 6   Period Weeks   Status New     PT LONG TERM GOAL #3   Title Patient will be able to stand for 2 hours without onset of pain to demonstrate significant improvement in lumbar and LE function.    Baseline patient able to stand for 10 min   Time 6   Period Weeks   Status New     PT LONG TERM GOAL #4   Title Patient will have full AROM; equal to the L side to demonstrate significant improvement in lumbar functioning   Baseline See eval notes for AROM   Time 6   Period Weeks   Status New               Plan - 02/09/17 0857    Clinical Impression Statement Patient instructed in advanced LE stretches and strengthening exercise. She requires cues to improve positioning and increase ROM for better strengthening. Patient reports no increase in pain with advanced exercise. She reports some fatigue and discomfort in right posterior hip which she reports, "It feels like I've had a good workout". Patient would benefit from additional skilled PT intervention to improve strength, ROM and reduce pain with ADLs;    Rehab Potential Good   Clinical Impairments Affecting Rehab Potential (+)family/friend support; (-) age, chronicity of condition   PT Frequency 2x / week   PT Duration 6 weeks   PT Treatment/Interventions Patient/family education;Therapeutic activities;Therapeutic exercise;Balance training;ADLs/Self Care Home Management;Electrical Stimulation;Stair training;Neuromuscular re-education;Functional mobility training;Manual techniques;Traction;Moist Heat;Cryotherapy;Ultrasound;Passive range of motion;Dry needling   PT Next Visit Plan Improve lumbar mobility, core stab   PT Home Exercise Plan continue as given;    Consulted and Agree with Plan of Care Patient      Patient will benefit from skilled therapeutic intervention in order to improve the following deficits and impairments:  Decreased mobility, Decreased coordination, Decreased  endurance, Decreased strength, Difficulty walking, Decreased activity tolerance, Increased muscle spasms, Postural dysfunction, Improper body mechanics, Increased fascial restricitons, Decreased balance, Abnormal gait  Visit Diagnosis: Right-sided low back pain with right-sided sciatica, unspecified chronicity  Stiffness of right hip, not elsewhere classified  Muscle weakness (generalized)     Problem List Patient Active Problem List   Diagnosis Date Noted  . Insomnia, persistent 07/14/2015  . Abnormal liver enzymes 07/14/2015  . HLD (hyperlipidemia) 07/14/2015  . Osteopenia 07/14/2015  . Cyst of ovary 07/14/2015  . Avitaminosis D 07/14/2015  . Chest pain 03/10/2011    Melbert Botelho PT, DPT 02/09/2017, 9:24 AM  [place] MAIN [facility] SERVICES [address] [place], [place], [address] Phone: [phone]   Fax:  [phone]  Name: Mary [name] MRN: [id number] Date of Birth: [birth year]

## 2017-02-14 ENCOUNTER — Ambulatory Visit: Payer: PRIVATE HEALTH INSURANCE | Admitting: Physical Therapy

## 2017-02-16 ENCOUNTER — Ambulatory Visit: Payer: PRIVATE HEALTH INSURANCE | Admitting: Physical Therapy

## 2017-02-16 ENCOUNTER — Encounter: Payer: Self-pay | Admitting: Physical Therapy

## 2017-02-16 DIAGNOSIS — M5441 Lumbago with sciatica, right side: Secondary | ICD-10-CM | POA: Diagnosis not present

## 2017-02-16 DIAGNOSIS — M6281 Muscle weakness (generalized): Secondary | ICD-10-CM

## 2017-02-16 DIAGNOSIS — M25651 Stiffness of right hip, not elsewhere classified: Secondary | ICD-10-CM

## 2017-02-16 NOTE — Therapy (Signed)
[place] MAIN [facility] SERVICES [address] [place], [place], [address] Phone: [phone]   Fax:  [phone]  Physical Therapy Treatment  Patient Details  Name: Mary [name] MRN: [id number] Date of Birth: 1951/09/11 Referring Provider: Dr.Gilbert  Encounter Date: 02/16/2017      PT End of Session - 02/16/17 0816    Visit Number 8   Number of Visits 20   Date for PT Re-Evaluation 03/16/17   PT Start Time 0808   PT Stop Time 0846   PT Time Calculation (min) 38 min   Activity Tolerance Patient tolerated treatment well;No increased pain   Behavior During Therapy WFL for tasks assessed/performed      Past Medical History:  Diagnosis Date  . UTI (lower urinary tract infection)     Past Surgical History:  Procedure Laterality Date  . TONSILLECTOMY    . TUBAL LIGATION      There were no vitals filed for this visit.      Subjective Assessment - 02/16/17 0900    Subjective Patient reports that she had a great week last week with no pain. She states, "I was able to work in the yard and it didn't hurt." She reports that yesterday she started getting the nerve pain down her right leg which was severe; She reports continued tingling down RLE this morning;    Pertinent History Hx of back pain, vagal nerve sensitivity (passing out)   Diagnostic tests X-Ray: Anterolisthesis l4-5   Currently in Pain? No/denies   Pain Onset More than a month ago      TREATMENT: Patient reports mild tenderness along right posterior hip (piriformis and glutes)   Patient in hoooklying: Modified piriformis stretch on right side 15 sec hold x2; Regular piriformis stretch on right side 15 sec hold x2; Hamstring neural stretch with knee flexion/extension 5 sec hold x5 bilaterally; Hamstring neural stretch with knee extended and ankle DF/PF x10 reps AROM bilaterally; Lumbar trunk rotation to left side only and back to midline x1 min gentle ROM;  Patient  required min VCs for correct positioning for LE stretches to reduce discomfort; LLE single leg bridge with RLE across knee x10 reps with slight discomfort in right hip reported;   Left sidelying: RLE hip abduction SLR x15 reps AROM;patient required min VCs to avoid hip ER for better abductor strengthening;  PT positioned patient for lumbar neural gap joint mobs with upper trunk right rotation and lumbar left rotation with grade II mobs along lower lumbar spine 10 sec bouts x3 sets; Patient denies any discomfort or tightness;   Patient prone: PT performed grade III PA Joint mobs along thoracolumbar spine 5 sec bouts each segmental level; Patient exhibits tightness in upper thoracic but denies any pain down RLE or in low back during joint mobs; PT assessed lumbar rotational movement of transverse process; Identified tightness and limited movement on right side. Performed grade II-III unilateral PA mobs to right transverse process L5, L4, L3 10 sec bouts x3 each; Patient denies any pain but does report some pressure;        [facility] PT Assessment - 02/16/17 0001      AROM   Lumbar Flexion WFL   Lumbar Extension [facility]     Strength   Right Hip ABduction 4/5     Reinforced HEP; Patient denies any pain down RLE at end of treatment session and denies any tingling. She does admit to being guarded as to reduce risk for onset of  RLE pain;   Recommend patient continue with HEP; reinforced pelvic rocks to improve lumbopelvic mobility;                         PT Education - 02/16/17 0909    Education provided Yes   Education Details HEP reinforced, exercise technique/stretch;    Person(s) Educated Patient   Methods Explanation;Verbal cues   Comprehension Verbalized understanding;Returned demonstration;Verbal cues required             PT Long Term Goals - 02/16/17 0816      PT LONG TERM GOAL #1   Title Pt will be independent with HEP to demonstrate improvements after  discharge from PT   Baseline Dependent with form/technique   Time 4   Period Weeks   Status On-going     PT LONG TERM GOAL #2   Title Patient will improve LEFS score to >75/80 to demonstrate significant improvement in LE and lumbar functioning.    Baseline LEFS: 63/80   Time 4   Period Weeks   Status On-going     PT LONG TERM GOAL #3   Title Patient will be able to stand for 2 hours without onset of pain to demonstrate significant improvement in lumbar and LE function.    Baseline patient able to stand for 10 min   Time 4   Period Weeks   Status Partially Met     PT LONG TERM GOAL #4   Title Patient will have full AROM; equal to the L side to demonstrate significant improvement in lumbar functioning   Baseline See eval notes for AROM   Time 4   Period Weeks   Status Partially Met               Plan - 02/16/17 0910    Clinical Impression Statement PT assessed pateint's thoracolumbar spine. She does have some tightness in upper thoracic spine but denies any increase in pain with PA mobs. Patient does exhibit right transverse process tightness with decreased rotational joint movement at L5, L4, L3 which could be contributing to some discomfort; Patient responded well to manual therapy and LE stretches without increase in pain. she would benefit from skilled PT intervention to improve flexibility and reduce pain with ADLs/functional mobility;    Rehab Potential Good   Clinical Impairments Affecting Rehab Potential (+)family/friend support; (-) age, chronicity of condition   PT Frequency 2x / week   PT Duration 4 weeks   PT Treatment/Interventions Patient/family education;Therapeutic activities;Therapeutic exercise;Balance training;ADLs/Self Care Home Management;Electrical Stimulation;Stair training;Neuromuscular re-education;Functional mobility training;Manual techniques;Traction;Moist Heat;Cryotherapy;Ultrasound;Passive range of motion;Dry needling   PT Next Visit Plan Improve  lumbar mobility, core stab   PT Home Exercise Plan continue as given;    Consulted and Agree with Plan of Care Patient      Patient will benefit from skilled therapeutic intervention in order to improve the following deficits and impairments:  Decreased mobility, Decreased coordination, Decreased endurance, Decreased strength, Difficulty walking, Decreased activity tolerance, Increased muscle spasms, Postural dysfunction, Improper body mechanics, Increased fascial restricitons, Decreased balance, Abnormal gait  Visit Diagnosis: Right-sided low back pain with right-sided sciatica, unspecified chronicity - Plan: PT plan of care cert/re-cert  Stiffness of right hip, not elsewhere classified - Plan: PT plan of care cert/re-cert  Muscle weakness (generalized) - Plan: PT plan of care cert/re-cert     Problem List Patient Active Problem List   Diagnosis Date Noted  . Insomnia, persistent 07/14/2015  .  Abnormal liver enzymes 07/14/2015  . HLD (hyperlipidemia) 07/14/2015  . Osteopenia 07/14/2015  . Cyst of ovary 07/14/2015  . Avitaminosis D 07/14/2015  . Chest pain 03/10/2011    Trotter,Margaret PT, DPT 02/16/2017, 9:27 AM  [place] MAIN [facility] SERVICES [address] [place], [place], [address] Phone: [phone]   Fax:  [phone]  Name: LILYONNA STEIDLE MRN: [id number] Date of Birth: Nov 14, 1950

## 2017-02-21 ENCOUNTER — Ambulatory Visit: Payer: PRIVATE HEALTH INSURANCE | Admitting: Physical Therapy

## 2017-02-23 ENCOUNTER — Ambulatory Visit: Payer: PRIVATE HEALTH INSURANCE | Admitting: Physical Therapy

## 2017-02-23 ENCOUNTER — Encounter: Payer: Self-pay | Admitting: Physical Therapy

## 2017-02-23 DIAGNOSIS — M5441 Lumbago with sciatica, right side: Secondary | ICD-10-CM | POA: Diagnosis not present

## 2017-02-23 DIAGNOSIS — M25651 Stiffness of right hip, not elsewhere classified: Secondary | ICD-10-CM

## 2017-02-23 DIAGNOSIS — M6281 Muscle weakness (generalized): Secondary | ICD-10-CM

## 2017-02-23 NOTE — Therapy (Signed)
[name] SERVICES [address] [place], [place], [address] Phone: [phone]   Fax:  [phone]  Physical Therapy Treatment  Patient Details  Name: Mary [name] MRN: [id number] Date of Birth: 1951-03-24 Referring Provider: Dr.Gilbert  Encounter Date: 02/23/2017      PT End of Session - 02/23/17 0814    Visit Number 9   Number of Visits 20   Date for PT Re-Evaluation 03/16/17   PT Start Time 0806   PT Stop Time 0845   PT Time Calculation (min) 39 min   Activity Tolerance Patient tolerated treatment well;No increased pain   Behavior During Therapy WFL for tasks assessed/performed      Past Medical History:  Diagnosis Date  . UTI (lower urinary tract infection)     Past Surgical History:  Procedure Laterality Date  . TONSILLECTOMY    . TUBAL LIGATION      There were no vitals filed for this visit.      Subjective Assessment - 02/23/17 0811    Subjective Patient reports having 1 day of increased pain down the right leg that continues to go all the way down to her foot (Took pain meds 2x that day, did not take any pain meds any other day in the last week); She is still having pain first in the morning that it localized to posterior right hip; She reports a mild ache this morning as she was coming down the stairs;    Pertinent History Hx of back pain, vagal nerve sensitivity (passing out)   Diagnostic tests X-Ray: Anterolisthesis l4-5   Currently in Pain? Yes   Pain Score 2    Pain Location Hip   Pain Orientation Right   Pain Descriptors / Indicators Aching;Sore   Pain Type Chronic pain   Pain Onset More than a month ago   Pain Frequency Intermittent   Aggravating Factors  worse in the morning   Pain Relieving Factors stretches, exercise; pain meds   Effect of Pain on Daily Activities decreased activity tolerance;         TREATMENT: Patient reports mild tenderness along right posterior hip (piriformis and  glutes)   Patient in hoooklying: Pball lumbar trunk rotation x2 min with cues to avoid painful ROM; Pball double knee to chest 15 sec hold x2 bilaterally;  Regular piriformis stretch on right side 15 sec hold x2; Hamstring neural stretch with knee flexion/extension 3 sec hold x10 bilaterally; Hamstring neural stretch with knee extended and ankle DF/PF x10 reps AROM bilaterally; Patient required min VCs for correct positioning for LE stretches to reduce discomfort; LLE single leg bridge with RLE across knee x10 reps with slight discomfort in right hip reported;   Left sidelying: RLE resisted hip flexion with manual resistance 5 sec hold x5 reps x2 sets with PT performing manual RLE pelvic anterior rotation x5 reps x2 sets;  Patient prone: PT performed grade III PA Joint mobs along right PSIS 10 sec bouts x3 sets; Patient exhibits better mobility and less pain during 2nd bouts;  She continues to have a "Pinch" Pain when rotating to back but denies any pain upon standing; She reports less "pinch" when therapist provides anterior pressure along right PSIS with rotation; Would benefit from additional pelvic mobility exercise;                           PT Education - 02/23/17 0814    Education provided  Yes   Education Details HEP reinforced, exercise technique/stretch;    Person(s) Educated Patient   Methods Explanation;Verbal cues;Demonstration   Comprehension Verbalized understanding;Returned demonstration;Verbal cues required             PT Long Term Goals - 02/16/17 0816      PT LONG TERM GOAL #1   Title Pt will be independent with HEP to demonstrate improvements after discharge from PT   Baseline Dependent with form/technique   Time 4   Period Weeks   Status On-going     PT LONG TERM GOAL #2   Title Patient will improve LEFS score to >75/80 to demonstrate significant improvement in LE and lumbar functioning.    Baseline LEFS: 63/80   Time 4    Period Weeks   Status On-going     PT LONG TERM GOAL #3   Title Patient will be able to stand for 2 hours without onset of pain to demonstrate significant improvement in lumbar and LE function.    Baseline patient able to stand for 10 min   Time 4   Period Weeks   Status Partially Met     PT LONG TERM GOAL #4   Title Patient will have full AROM; equal to the L side to demonstrate significant improvement in lumbar functioning   Baseline See eval notes for AROM   Time 4   Period Weeks   Status Partially Met               Plan - 02/23/17 0941    Clinical Impression Statement Patient continues to respond well to manual therapy  and stretches with less pain in right hip following exercise/treatment. She does have increased tightness in right pelvis today. PT performed anterior glide while patient performed manual resisted hip flexion. This helped reduce stiffness and pain. Patient reports less pain when turning over in bed with anterior pressure to right PSIS. Patient does express concern with stress contributing to some discomfort. educated patient in stress coping mechanisms including walking, listening to music, rest; She would benefit from skilled PT intervention to improve pelvic mobility and reduce pain with ADLs;    Rehab Potential Good   Clinical Impairments Affecting Rehab Potential (+)family/friend support; (-) age, chronicity of condition   PT Frequency 2x / week   PT Duration 4 weeks   PT Treatment/Interventions Patient/family education;Therapeutic activities;Therapeutic exercise;Balance training;ADLs/Self Care Home Management;Electrical Stimulation;Stair training;Neuromuscular re-education;Functional mobility training;Manual techniques;Traction;Moist Heat;Cryotherapy;Ultrasound;Passive range of motion;Dry needling   PT Next Visit Plan Improve lumbar mobility, core stab   PT Home Exercise Plan continue as given;    Consulted and Agree with Plan of Care Patient       Patient will benefit from skilled therapeutic intervention in order to improve the following deficits and impairments:  Decreased mobility, Decreased coordination, Decreased endurance, Decreased strength, Difficulty walking, Decreased activity tolerance, Increased muscle spasms, Postural dysfunction, Improper body mechanics, Increased fascial restricitons, Decreased balance, Abnormal gait  Visit Diagnosis: Right-sided low back pain with right-sided sciatica, unspecified chronicity  Stiffness of right hip, not elsewhere classified  Muscle weakness (generalized)     Problem List Patient Active Problem List   Diagnosis Date Noted  . Insomnia, persistent 07/14/2015  . Abnormal liver enzymes 07/14/2015  . HLD (hyperlipidemia) 07/14/2015  . Osteopenia 07/14/2015  . Cyst of ovary 07/14/2015  . Avitaminosis D 07/14/2015  . Chest pain 03/10/2011    Trotter,Margaret PT, DPT 02/23/2017, 9:44 AM  [place] [facility] SERVICES  [place], [place], [address] Phone: [phone]   Fax:  [phone]  Name: Mary [name] MRN: [id number] Date of Birth: 09/05/1951

## 2017-02-28 ENCOUNTER — Encounter: Payer: PRIVATE HEALTH INSURANCE | Admitting: Physical Therapy

## 2017-03-02 ENCOUNTER — Encounter: Payer: Self-pay | Admitting: Physical Therapy

## 2017-03-02 ENCOUNTER — Ambulatory Visit: Payer: PRIVATE HEALTH INSURANCE | Admitting: Physical Therapy

## 2017-03-02 VITALS — BP 129/76

## 2017-03-02 DIAGNOSIS — M5441 Lumbago with sciatica, right side: Secondary | ICD-10-CM

## 2017-03-02 DIAGNOSIS — M6281 Muscle weakness (generalized): Secondary | ICD-10-CM

## 2017-03-02 DIAGNOSIS — M25651 Stiffness of right hip, not elsewhere classified: Secondary | ICD-10-CM

## 2017-03-02 NOTE — Therapy (Signed)
[place] MAIN [facility] SERVICES [address] [place], [place], [address] Phone: [phone]   Fax:  [phone]  Physical Therapy Treatment  Patient Details  Name: Mary [name] MRN: [id number] Date of Birth: Nov 18, 1950 Referring Provider: Dr.Gilbert  Encounter Date: 03/02/2017      PT End of Session - 03/02/17 0801    Visit Number 10   Number of Visits 20   Date for PT Re-Evaluation 03/16/17   Authorization Type No g codes   PT Start Time 0801   PT Stop Time 0838   PT Time Calculation (min) 37 min   Equipment Utilized During Treatment --   Activity Tolerance Patient tolerated treatment well;No increased pain   Behavior During Therapy WFL for tasks assessed/performed      Past Medical History:  Diagnosis Date  . UTI (lower urinary tract infection)     Past Surgical History:  Procedure Laterality Date  . TONSILLECTOMY    . TUBAL LIGATION      Vitals:   03/02/17 0808  BP: 129/76        Subjective Assessment - 03/02/17 0807    Subjective Pt reports that this week is the first week that her pain has consistently been a dull ache rather than the intense sharpness she has felt in the past.  She feels like she is on the right path.  She has not had to take any pain medicine this past week.  Pt went to zumba for the first time this week since onset of pain which only brought on soreness for exercising, no pain.     Pertinent History Hx of back pain, vagal nerve sensitivity (passing out)   Diagnostic tests X-Ray: Anterolisthesis l4-5   Currently in Pain? Yes   Pain Score 1    Pain Location Back   Pain Orientation Right   Pain Descriptors / Indicators Aching   Pain Type Chronic pain   Pain Onset More than a month ago   Pain Frequency Constant   Multiple Pain Sites No       TREATMENT   Lumbar trunk rotation x2 min with cues to avoid painful ROM;   Double knee to chest 15 sec hold x5   Regular piriformis stretch on right  side 15 sec hold x2;   Hamstring neural stretch with knee flexion/extension 3 sec hold x10?bilaterally;   Hamstring neural stretch with knee extended and ankle DF/PF x10 reps AROM bilaterally;   LLE single leg bridge with RLE across knee x10 reps with cues to raise hips more for greater glute activation  In L sidelying RLE resisted hip flexion with manual resistance 5 sec hold x5 reps x2 sets with PT performing manual RLE pelvic anterior rotation   Seated prayer stretch with theraball x5 with 10 second holds forward   Squats on wall with theraball x10 with cues for core activation        PT Education - 03/02/17 0801    Education provided Yes   Education Details Exercise technique   Person(s) Educated Patient   Methods Explanation;Demonstration;Verbal cues   Comprehension Verbalized understanding;Returned demonstration;Verbal cues required;Need further instruction             PT Long Term Goals - 02/16/17 0816      PT LONG TERM GOAL #1   Title Pt will be independent with HEP to demonstrate improvements after discharge from PT   Baseline Dependent with form/technique   Time 4   Period Weeks  Status On-going     PT LONG TERM GOAL #2   Title Patient will improve LEFS score to >75/80 to demonstrate significant improvement in LE and lumbar functioning.    Baseline LEFS: 63/80   Time 4   Period Weeks   Status On-going     PT LONG TERM GOAL #3   Title Patient will be able to stand for 2 hours without onset of pain to demonstrate significant improvement in lumbar and LE function.    Baseline patient able to stand for 10 min   Time 4   Period Weeks   Status Partially Met     PT LONG TERM GOAL #4   Title Patient will have full AROM; equal to the L side to demonstrate significant improvement in lumbar functioning   Baseline See eval notes for AROM   Time 4   Period Weeks   Status Partially Met               Plan - 03/02/17 0820    Clinical Impression  Statement Pt reports she has been feeling relief with lower back stretches and has noticed increased flexibility and improvement in pain since starting therapy.  She responded well to all interventions this date, including the introduction of a seated prayer stretch.  She will benefit from continued skilled PT interventions for improved flexibility a decreased pain.   Rehab Potential Good   Clinical Impairments Affecting Rehab Potential (+)family/friend support; (-) age, chronicity of condition   PT Frequency 2x / week   PT Duration 4 weeks   PT Treatment/Interventions Patient/family education;Therapeutic activities;Therapeutic exercise;Balance training;ADLs/Self Care Home Management;Electrical Stimulation;Stair training;Neuromuscular re-education;Functional mobility training;Manual techniques;Traction;Moist Heat;Cryotherapy;Ultrasound;Passive range of motion;Dry needling   PT Next Visit Plan Improve lumbar mobility, core stab   PT Home Exercise Plan continue as given;    Consulted and Agree with Plan of Care Patient      Patient will benefit from skilled therapeutic intervention in order to improve the following deficits and impairments:  Decreased mobility, Decreased coordination, Decreased endurance, Decreased strength, Difficulty walking, Decreased activity tolerance, Increased muscle spasms, Postural dysfunction, Improper body mechanics, Increased fascial restricitons, Decreased balance, Abnormal gait  Visit Diagnosis: Right-sided low back pain with right-sided sciatica, unspecified chronicity  Stiffness of right hip, not elsewhere classified  Muscle weakness (generalized)     Problem List Patient Active Problem List   Diagnosis Date Noted  . Insomnia, persistent 07/14/2015  . Abnormal liver enzymes 07/14/2015  . HLD (hyperlipidemia) 07/14/2015  . Osteopenia 07/14/2015  . Cyst of ovary 07/14/2015  . Avitaminosis D 07/14/2015  . Chest pain 03/10/2011    Collie Siad PT,  DPT 03/02/2017, 8:39 AM  [place] MAIN [facility] SERVICES [address] [place], [place], [address] Phone: [phone]   Fax:  [phone]  Name: Mary [name] MRN: [id number] Date of Birth: 23-Oct-1950

## 2017-03-07 ENCOUNTER — Encounter: Payer: PRIVATE HEALTH INSURANCE | Admitting: Physical Therapy

## 2017-03-14 ENCOUNTER — Ambulatory Visit: Payer: PRIVATE HEALTH INSURANCE | Attending: Family Medicine | Admitting: Physical Therapy

## 2017-03-14 ENCOUNTER — Encounter: Payer: Self-pay | Admitting: Physical Therapy

## 2017-03-14 ENCOUNTER — Encounter: Payer: PRIVATE HEALTH INSURANCE | Admitting: Physical Therapy

## 2017-03-14 DIAGNOSIS — M5441 Lumbago with sciatica, right side: Secondary | ICD-10-CM | POA: Insufficient documentation

## 2017-03-14 DIAGNOSIS — M6281 Muscle weakness (generalized): Secondary | ICD-10-CM | POA: Diagnosis present

## 2017-03-14 DIAGNOSIS — M25651 Stiffness of right hip, not elsewhere classified: Secondary | ICD-10-CM | POA: Diagnosis present

## 2017-03-14 NOTE — Therapy (Signed)
[place] MAIN [facility] SERVICES [address] [place], [place], [address] Phone: [phone]   Fax:  [phone]  Physical Therapy Treatment  Patient Details  Name: Mary [name] MRN: [id number] Date of Birth: 02/16/1951 Referring Provider: Dr.Gilbert  Encounter Date: 03/14/2017      PT End of Session - 03/14/17 1714    Visit Number 11   Number of Visits 20   Date for PT Re-Evaluation 03/16/17   Authorization Type No g codes   PT Start Time 1702   PT Stop Time 3500   PT Time Calculation (min) 43 min   Activity Tolerance Patient tolerated treatment well;No increased pain   Behavior During Therapy WFL for tasks assessed/performed      Past Medical History:  Diagnosis Date  . UTI (lower urinary tract infection)     Past Surgical History:  Procedure Laterality Date  . TONSILLECTOMY    . TUBAL LIGATION      There were no vitals filed for this visit.      Subjective Assessment - 03/14/17 1712    Subjective Pt reports that this week is the first week that her pain has consistently been a dull ache rather than the intense sharpness she has felt in the past.  She feels like she is on the right path.  She has not had to take any pain medicine this past week.  Pt went to zumba for the first time this week since onset of pain which only brought on soreness for exercising, no pain.  reports taking pain medication 1 day over last week;    Pertinent History Hx of back pain, vagal nerve sensitivity (passing out)   Diagnostic tests X-Ray: Anterolisthesis l4-5   Currently in Pain? Yes   Pain Score 2    Pain Location Hip   Pain Orientation Right   Pain Descriptors / Indicators Aching   Pain Type Chronic pain   Pain Radiating Towards down towards the right foot;    Pain Onset More than a month ago   Pain Frequency Intermittent   Aggravating Factors  worse after prolonged walking   Pain Relieving Factors stretches, exercise, pain meds   Effect of  Pain on Daily Activities decreased activity tolerance;    Multiple Pain Sites No       TREATMENT:  Manual therapy: PT student assessed lumbar mobility with PA mobs to lumbar spine; PA mobs to L4, L5 grade III-IV, 30 sec bouts x 6 Soft tissue mobilization to lumbar paraspinals (erector spinae) to improve flexibility and reduce tightness x4 min; Soft tissue mobilization and myofascial release to right gluteal max, hip external rotators x5 min to reduce tenderness and improve joint mobility;   Patient responded well with less pain after manual therapy; She continues to have discomfort in right posterior hip with rotating to supine position;  Patient hooklying: Bridges with arms by side x10 reps with cues to keep feet apart and to push through LE; Patient reports increased right posterior hip discomfort during bridges;  Patient in sidelying: Clamshell isometric with ball between knee 5 sec hold x5 reps bilaterally; Clamshell AROM with cues to avoid posterior trunk rotation AROM x10 reps with cues to increase ROM with each repetition for better strengthening; Hip abduction SLR with verbal/tactile cues to progressively increase ROM for better flexibility and strengthening x10 reps bilaterally;  Side step up and over 4 inch step with red tband around both ankles for gluteal strengthening x10 each direction with rail  assist; Patient reports increased fatigue but no pain; She required rail assist for safety; She did require cues to isolate gluteal med activation and reduce TFL compensation;                           PT Education - 03/14/17 1814    Education provided Yes   Education Details exercise technique, stretches, HEP reinforced, manual therapy   Person(s) Educated Patient   Methods Explanation;Demonstration;Verbal cues   Comprehension Verbalized understanding;Returned demonstration;Verbal cues required;Need further instruction             PT Long Term Goals  - 02/16/17 0816      PT LONG TERM GOAL #1   Title Pt will be independent with HEP to demonstrate improvements after discharge from PT   Baseline Dependent with form/technique   Time 4   Period Weeks   Status On-going     PT LONG TERM GOAL #2   Title Patient will improve LEFS score to >75/80 to demonstrate significant improvement in LE and lumbar functioning.    Baseline LEFS: 63/80   Time 4   Period Weeks   Status On-going     PT LONG TERM GOAL #3   Title Patient will be able to stand for 2 hours without onset of pain to demonstrate significant improvement in lumbar and LE function.    Baseline patient able to stand for 10 min   Time 4   Period Weeks   Status Partially Met     PT LONG TERM GOAL #4   Title Patient will have full AROM; equal to the L side to demonstrate significant improvement in lumbar functioning   Baseline See eval notes for AROM   Time 4   Period Weeks   Status Partially Met               Plan - 03/14/17 1816    Clinical Impression Statement Patient tolerated manual therapy well; She continues to respond well to manual therapy with less pain and stiffness. Patient exhibits increased gluteal weakness with pain during hip abduction. She responded well to isometric progressing to AROM. she denies any pain at end of session but does report fatigue. She would benefit from additional skilled PT intervention to improve strength and reduce pain with ADLs;   Rehab Potential Good   Clinical Impairments Affecting Rehab Potential (+)family/friend support; (-) age, chronicity of condition   PT Frequency 2x / week   PT Duration 4 weeks   PT Treatment/Interventions Patient/family education;Therapeutic activities;Therapeutic exercise;Balance training;ADLs/Self Care Home Management;Electrical Stimulation;Stair training;Neuromuscular re-education;Functional mobility training;Manual techniques;Traction;Moist Heat;Cryotherapy;Ultrasound;Passive range of motion;Dry needling    PT Next Visit Plan Improve lumbar mobility, core stab   PT Home Exercise Plan continue as given;    Consulted and Agree with Plan of Care Patient      Patient will benefit from skilled therapeutic intervention in order to improve the following deficits and impairments:  Decreased mobility, Decreased coordination, Decreased endurance, Decreased strength, Difficulty walking, Decreased activity tolerance, Increased muscle spasms, Postural dysfunction, Improper body mechanics, Increased fascial restricitons, Decreased balance, Abnormal gait  Visit Diagnosis: Right-sided low back pain with right-sided sciatica, unspecified chronicity  Stiffness of right hip, not elsewhere classified  Muscle weakness (generalized)     Problem List Patient Active Problem List   Diagnosis Date Noted  . Insomnia, persistent 07/14/2015  . Abnormal liver enzymes 07/14/2015  . HLD (hyperlipidemia) 07/14/2015  . Osteopenia 07/14/2015  . Cyst  of ovary 07/14/2015  . Avitaminosis D 07/14/2015  . Chest pain 03/10/2011    Jorgia Manthei,MargaretPT, DPT 03/14/2017, 6:20 PM  [place] MAIN [facility] SERVICES [address] [place], [place], [address] Phone: [phone]   Fax:  [phone]  Name: BLESSIN KANNO MRN: [id number] Date of Birth: 1951-02-07

## 2017-03-16 ENCOUNTER — Encounter: Payer: Self-pay | Admitting: Physical Therapy

## 2017-03-16 ENCOUNTER — Ambulatory Visit: Payer: PRIVATE HEALTH INSURANCE | Admitting: Physical Therapy

## 2017-03-16 DIAGNOSIS — M25651 Stiffness of right hip, not elsewhere classified: Secondary | ICD-10-CM

## 2017-03-16 DIAGNOSIS — M5441 Lumbago with sciatica, right side: Secondary | ICD-10-CM

## 2017-03-16 DIAGNOSIS — M6281 Muscle weakness (generalized): Secondary | ICD-10-CM

## 2017-03-16 NOTE — Therapy (Signed)
[place] MAIN [facility] SERVICES [address] [place], [place], [address] Phone: [phone]   Fax:  [phone]  Physical Therapy Treatment  Patient Details  Name: Mary [name] MRN: [id number] Date of Birth: 1951/10/10 Referring Provider: Dr.Gilbert  Encounter Date: 03/16/2017      PT End of Session - 03/16/17 0815    Visit Number 12   Number of Visits 28   Date for PT Re-Evaluation 04/13/17   Authorization Type No g codes   PT Start Time 0804   PT Stop Time 0845   PT Time Calculation (min) 41 min   Activity Tolerance Patient tolerated treatment well;No increased pain   Behavior During Therapy WFL for tasks assessed/performed      Past Medical History:  Diagnosis Date  . UTI (lower urinary tract infection)     Past Surgical History:  Procedure Laterality Date  . TONSILLECTOMY    . TUBAL LIGATION      There were no vitals filed for this visit.      Subjective Assessment - 03/16/17 0814    Subjective Patient reports doing okay today; She was able to do 15 min on the elliptical without increase in pain; In the last week worst pain in right hips was 3/10; she still has some days of no pain; She reports compliance with HEP;    Pertinent History Hx of back pain, vagal nerve sensitivity (passing out)   Diagnostic tests X-Ray: Anterolisthesis l4-5   Pain Onset More than a month ago            TREATMENT: Gait on treadmill 2.4 mph x3 min ((unbilled);   Manual therapy: PT student assessed lumbar mobility with PA mobs to lumbar spine; PA mobs to L3, L4, L5 grade III-IV, 30 sec bouts x1 with increased tingling at L5; Grade II PA mobs at L5 30 sec bouts without symptoms;  Unilateral  PA mobs to L5 grade II-III 30 sec bouts each side reproducing tingling on right side;  Patient exhibits less stiff but did have some tingling around L5 which was less with less pressure of joint mobs;   Prone: Press up with therapist assist to come  into full lumbar extension x10 reps; Patient required min Vcs to avoid gluteal contraction during press up. She was able to exhibit better ROM without discomfort;   Patient continues to have pain with rolling towards sidelying; PT instructed patient in log rolling and patient reports still having pain along right lateral/posterior hip.  PT identified increased tenderness along right greater trochanter and posterior-lateral right hip soft tissue. Instructed patient in right sidelying bolster rolling x30 sec x2 reps; Patient required min A for getting into position and cues for correct foot/LE placement. She reports initial discomfort but with increased time reports less discomfort; She reports no pain upon sitting/standing;  Standing on 4 inch step with B rail assist: Red tband around both legs: Hip abduction, SLS 2x12 reps bilaterally with cues to avoid trunk lean for better hip strengthening and avoid compensation;  Hip extension SLS 2x12 reps bilaterally with cues to avoid forward lean to isolate hip extension strengthening;                       PT Education - 03/16/17 0815    Education provided Yes   Education Details exercise technique, stretches, HEP reinforced, manual therapy;    Person(s) Educated Patient   Methods Explanation;Demonstration;Verbal cues   Comprehension Verbalized understanding;Returned demonstration;Verbal  cues required;Need further instruction             PT Long Term Goals - 03/16/17 0816      PT LONG TERM GOAL #1   Title Pt will be independent with HEP to demonstrate improvements after discharge from PT   Baseline Dependent with form/technique   Time 4   Period Weeks   Status Partially Met     PT LONG TERM GOAL #2   Title Patient will improve LEFS score to >75/80 to demonstrate significant improvement in LE and lumbar functioning.    Baseline --   Time 4   Period Weeks   Status Partially Met     PT LONG TERM GOAL #3   Title  Patient will be able to stand for 2 hours without onset of pain to demonstrate significant improvement in lumbar and LE function.    Baseline able to stand for 1-1.5 hours without pain;    Time 4   Period Weeks   Status Partially Met     PT LONG TERM GOAL #4   Title Patient will have full AROM; equal to the L side to demonstrate significant improvement in lumbar functioning   Baseline See eval notes for AROM   Time 4   Period Weeks   Status Achieved               Plan - 03/16/17 0855    Clinical Impression Statement Patient benefited from manual therapy with no increase in pain following treatement. She had indications of myopathy in R posterior hip posterior/lateral to ASIS radiating to posterior greater trochanter and radicular pain with deep palpation. She performed well with ther-ex for glut med and glut max strengthening. She reports muscle fatigue following session, but no pain or radicular symptoms. Pt will benefit from continued therapy to reduce hip weakness and decrease hip pain.     Rehab Potential Good   Clinical Impairments Affecting Rehab Potential (+)family/friend support; (-) age, chronicity of condition   PT Frequency 2x / week   PT Duration 4 weeks   PT Treatment/Interventions Patient/family education;Therapeutic activities;Therapeutic exercise;Balance training;ADLs/Self Care Home Management;Electrical Stimulation;Stair training;Neuromuscular re-education;Functional mobility training;Manual techniques;Traction;Moist Heat;Cryotherapy;Ultrasound;Passive range of motion;Dry needling   PT Next Visit Plan Improve lumbar mobility, core stab   PT Home Exercise Plan continue as given;    Consulted and Agree with Plan of Care Patient      Patient will benefit from skilled therapeutic intervention in order to improve the following deficits and impairments:  Decreased mobility, Decreased coordination, Decreased endurance, Decreased strength, Difficulty walking, Decreased  activity tolerance, Increased muscle spasms, Postural dysfunction, Improper body mechanics, Increased fascial restricitons, Decreased balance, Abnormal gait  Visit Diagnosis: Right-sided low back pain with right-sided sciatica, unspecified chronicity  Stiffness of right hip, not elsewhere classified  Muscle weakness (generalized)     Problem List Patient Active Problem List   Diagnosis Date Noted  . Insomnia, persistent 07/14/2015  . Abnormal liver enzymes 07/14/2015  . HLD (hyperlipidemia) 07/14/2015  . Osteopenia 07/14/2015  . Cyst of ovary 07/14/2015  . Avitaminosis D 07/14/2015  . Chest pain 03/10/2011   Devan Groulx, SPT This entire session was performed under direct supervision and direction of a licensed therapist/therapist assistant . I have personally read, edited and approve of the note as written.   Trotter,Margaret PT, DPT 03/16/2017, 9:05 AM  [place] MAIN [facility] SERVICES [address] [place], [place], [address] Phone: [phone]   Fax:  [phone]  Name: Mary [name] MRN: [id number] Date of Birth: [birth year]

## 2017-03-21 ENCOUNTER — Encounter: Payer: PRIVATE HEALTH INSURANCE | Admitting: Physical Therapy

## 2017-03-21 ENCOUNTER — Ambulatory Visit: Payer: PRIVATE HEALTH INSURANCE | Admitting: Physical Therapy

## 2017-03-21 ENCOUNTER — Encounter: Payer: Self-pay | Admitting: Physical Therapy

## 2017-03-21 DIAGNOSIS — M5441 Lumbago with sciatica, right side: Secondary | ICD-10-CM | POA: Diagnosis not present

## 2017-03-21 DIAGNOSIS — M6281 Muscle weakness (generalized): Secondary | ICD-10-CM

## 2017-03-21 DIAGNOSIS — M25651 Stiffness of right hip, not elsewhere classified: Secondary | ICD-10-CM

## 2017-03-21 NOTE — Therapy (Signed)
[place] MAIN [facility] SERVICES [address] [place], [place], [address] Phone: [phone]   Fax:  [phone]  Physical Therapy Treatment  Patient Details  Name: Mary [name] MRN: [id number] Date of Birth: 08-01-1951 Referring Provider: Dr.Gilbert  Encounter Date: 03/21/2017      PT End of Session - 03/21/17 1651    Visit Number 13   Number of Visits 28   Date for PT Re-Evaluation 04/13/17   Authorization Type No g codes   PT Start Time 1606   PT Stop Time 1650   PT Time Calculation (min) 44 min   Activity Tolerance Patient tolerated treatment well;No increased pain   Behavior During Therapy WFL for tasks assessed/performed      Past Medical History:  Diagnosis Date  . UTI (lower urinary tract infection)     Past Surgical History:  Procedure Laterality Date  . TONSILLECTOMY    . TUBAL LIGATION      There were no vitals filed for this visit.      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 She reports no icrease in symptoms directly following previous session.    Pain Score 2    Pain Location Back   Pain Orientation Right;Lower   Pain Descriptors / Indicators Aching;Dull   Pain Type Chronic pain   Pain Onset Today   Pain Frequency Intermittent   Aggravating Factors  Lifting    Pain Relieving Factors heat   Effect of Pain on Daily Activities prolonged walking   Multiple Pain Sites No             TREATMENT: Manual therapy: PA mobs to lumbar spine; L3, L4, L5, S1 grade IV, 30 sec bouts x2 to increase functional pain-free range of motion; pt had no radicular symptoms with treatment.  Therapeutic Exercise: Qped: Cat camel x 5 with verbal and tactile cues for correct form, verbal cues for deep breathing at end range to improve thoracic mobility; pt with no increase in symtoms. Bird dog, single limb, 3 x 10" hold  for each limb with cues for neutral Lx and Cx spine, and to maintain level shoulders and pelvis throughout exercise; pt had nonpainful symptom reproduction in posterior R hip with R LE ext.   Standing 4 way resisted hip exercise with red t-band in parallel bars including hip flexion x 10 each, sidestepping x 20 each, and hip extension x 10 each; all with cues to avoid trunk lean for better hip strengthening and avoid compensation; pt reported minor soreness at PSIS with R hip extension. Pt unable to articulate whether this was muscle fatigue or pain, though pt reported no increase in pain from beginning of session.                         PT Education - 03/21/17 1606    Education provided Yes   Education Details Ther-ex, postural awareness   Person(s) Educated Patient   Methods Explanation;Demonstration;Verbal cues;Tactile cues   Comprehension Verbalized understanding;Returned demonstration;Verbal cues required;Tactile cues required             PT Long Term Goals - 03/16/17 0816      PT LONG TERM GOAL #1   Title Pt will be independent with HEP to demonstrate improvements after discharge from PT   Baseline Dependent with form/technique   Time 4  Period Weeks   Status Partially Met     PT LONG TERM GOAL #2   Title Patient will improve LEFS score to >75/80 to demonstrate significant improvement in LE and lumbar functioning.    Baseline --   Time 4   Period Weeks   Status Partially Met     PT LONG TERM GOAL #3   Title Patient will be able to stand for 2 hours without onset of pain to demonstrate significant improvement in lumbar and LE function.    Baseline able to stand for 1-1.5 hours without pain;    Time 4   Period Weeks   Status Partially Met     PT LONG TERM GOAL #4   Title Patient will have full AROM; equal to the L side to demonstrate significant improvement in lumbar functioning   Baseline See eval notes for AROM   Time 4   Period Weeks    Status Achieved               Plan - 03/21/17 1719    Clinical Impression Statement Pt instructed in hip strengthening exercises following lumbar spinal mobilization to increase functional mobility activity tolerance and decrease pain. Pt has had decreased irritability of her hip and back pain, but still is experienceing occasional flare-ups. Pt will benefit from continued skilled therapy to improver her pain and activity tolerance.   Rehab Potential Good   Clinical Impairments Affecting Rehab Potential (+)family/friend support; (-) age, chronicity of condition   PT Frequency 2x / week   PT Duration 4 weeks   PT Treatment/Interventions Patient/family education;Therapeutic activities;Therapeutic exercise;Balance training;ADLs/Self Care Home Management;Electrical Stimulation;Stair training;Neuromuscular re-education;Functional mobility training;Manual techniques;Traction;Moist Heat;Cryotherapy;Ultrasound;Passive range of motion;Dry needling   PT Next Visit Plan Improve lumbar mobility, core stab   PT Home Exercise Plan continue as given;    Consulted and Agree with Plan of Care Patient      Patient will benefit from skilled therapeutic intervention in order to improve the following deficits and impairments:  Decreased mobility, Decreased coordination, Decreased endurance, Decreased strength, Difficulty walking, Decreased activity tolerance, Increased muscle spasms, Postural dysfunction, Improper body mechanics, Increased fascial restricitons, Decreased balance, Abnormal gait  Visit Diagnosis: Right-sided low back pain with right-sided sciatica, unspecified chronicity  Stiffness of right hip, not elsewhere classified  Muscle weakness (generalized)     Problem List Patient Active Problem List   Diagnosis Date Noted  . Insomnia, persistent 07/14/2015  . Abnormal liver enzymes 07/14/2015  . HLD (hyperlipidemia) 07/14/2015  . Osteopenia 07/14/2015  . Cyst of ovary 07/14/2015  .  Avitaminosis D 07/14/2015  . Chest pain 03/10/2011   Burnett Corrente, SPT This entire session was performed under direct supervision and direction of a licensed therapist/therapist assistant . I have personally read, edited and approve of the note as written.  Trotter,Margaret PT, DPT 03/22/2017, 4:30 PM  [place] MAIN [facility] SERVICES [address] [place], [place], [address] Phone: [phone]   Fax:  [phone]  Name: RECHY BOST MRN: [id number] Date of Birth: 14-Apr-1951

## 2017-03-23 ENCOUNTER — Ambulatory Visit: Payer: PRIVATE HEALTH INSURANCE | Admitting: Physical Therapy

## 2017-03-28 ENCOUNTER — Ambulatory Visit: Payer: PRIVATE HEALTH INSURANCE | Admitting: Physical Therapy

## 2017-03-28 ENCOUNTER — Encounter: Payer: PRIVATE HEALTH INSURANCE | Admitting: Physical Therapy

## 2017-03-28 ENCOUNTER — Encounter: Payer: Self-pay | Admitting: Physical Therapy

## 2017-03-28 DIAGNOSIS — M5441 Lumbago with sciatica, right side: Secondary | ICD-10-CM | POA: Diagnosis not present

## 2017-03-28 DIAGNOSIS — M6281 Muscle weakness (generalized): Secondary | ICD-10-CM

## 2017-03-28 DIAGNOSIS — M25651 Stiffness of right hip, not elsewhere classified: Secondary | ICD-10-CM

## 2017-03-28 NOTE — Therapy (Signed)
[place] MAIN [facility] SERVICES [address] [place], [place], [address] Phone: [phone]   Fax:  [phone]  Physical Therapy Treatment  Patient Details  Name: ZILLA SHARTZER MRN: [id number] Date of Birth: Jan 13, 1951 Referring Provider: Dr.Gilbert  Encounter Date: 03/28/2017      PT End of Session - 03/28/17 1601    Visit Number 14   Number of Visits 28   Date for PT Re-Evaluation 04/13/17   Authorization Type No g codes   PT Start Time 1535   PT Stop Time 1619   PT Time Calculation (min) 44 min   Activity Tolerance Patient tolerated treatment well;No increased pain   Behavior During Therapy WFL for tasks assessed/performed      Past Medical History:  Diagnosis Date  . UTI (lower urinary tract infection)     Past Surgical History:  Procedure Laterality Date  . TONSILLECTOMY    . TUBAL LIGATION      There were no vitals filed for this visit.      Subjective Assessment - 03/28/17 1543    Subjective Pt reports she has been doing her exercises and has been exercising in the pool as well. She reports that had some increased back pain following a road trip this week, but she has no pain at this time.     Pertinent History Hx of back pain, vagal nerve sensitivity (passing out)   Diagnostic tests X-Ray: Anterolisthesis l4-5   Currently in Pain? No/denies   Pain Onset Today   Multiple Pain Sites No      Therapeutic Exercise:  Supine: Pelvic tilt exercise with cues to use hands on hips to increase proprioception of hip position, and for deep breathing at end range for active stretch to decrease muscle tightness. Pt reports no increased pain.   Qped: Cat camel x 20 with verbal and tactile cues for correct form including neutral Cx spine, verbal cues for deep breathing at end range to improve thoracic mobility; pt with slight irritation at R PSIS; pt instructed to decrease exercise to pain-free range. Bird dog, single limb, 4 x 10"  hold for each limb with cues for neutral Lx and Cx spine, and to maintain level shoulders and pelvis throughout exercise; pt had nonpainful symptom reproduction in posterior R hip that increased in intensity throughout exercise.   Supine: Bridge x 15 with 1" hold, pt achieving increased pain-free hip extension AROM with this exercise to strengthen hip extensors.   There-ex: Step up exercise to side on stairs with UE support 2 x 4 steps each direction in order to strengthen glut med and glut min. Pt reports slightly increased pain around R PSIS up to 3/10. Ice applied x 10 min in supine, which alleviated pain.              PT Long Term Goals - 03/16/17 0816      PT LONG TERM GOAL #1   Title Pt will be independent with HEP to demonstrate improvements after discharge from PT   Baseline Dependent with form/technique   Time 4   Period Weeks   Status Partially Met     PT LONG TERM GOAL #2   Title Patient will improve LEFS score to >75/80 to demonstrate significant improvement in LE and lumbar functioning.    Baseline --   Time 4   Period Weeks   Status Partially Met     PT LONG TERM GOAL #3   Title Patient will be  able to stand for 2 hours without onset of pain to demonstrate significant improvement in lumbar and LE function.    Baseline able to stand for 1-1.5 hours without pain;    Time 4   Period Weeks   Status Partially Met     PT LONG TERM GOAL #4   Title Patient will have full AROM; equal to the L side to demonstrate significant improvement in lumbar functioning   Baseline See eval notes for AROM   Time 4   Period Weeks   Status Achieved               Plan - 03/28/17 1623    Clinical Impression Statement Pt instructed in hip strengthening exercise with deep breathing exercises to improve hip and core stability. Patient was able to transition supine to prone without pain which is a significant improvement compared to previous sessions;  Pt had a slight  increase in irritation around R PSIS to 3/10 that came on gradually during exercise. Ice was applied, and pt reported no pain at end of session. Pt had increased exercise tolerance and increased pain free range during bridge exercise. Pt will benefit from continued therapy improve pain-free functional mobility.   Rehab Potential Good   Clinical Impairments Affecting Rehab Potential (+)family/friend support; (-) age, chronicity of condition   PT Frequency 2x / week   PT Duration 4 weeks   PT Treatment/Interventions Patient/family education;Therapeutic activities;Therapeutic exercise;Balance training;ADLs/Self Care Home Management;Electrical Stimulation;Stair training;Neuromuscular re-education;Functional mobility training;Manual techniques;Traction;Moist Heat;Cryotherapy;Ultrasound;Passive range of motion;Dry needling   PT Next Visit Plan Improve lumbar mobility, core stab   PT Home Exercise Plan continue as given;    Consulted and Agree with Plan of Care Patient      Patient will benefit from skilled therapeutic intervention in order to improve the following deficits and impairments:  Decreased mobility, Decreased coordination, Decreased endurance, Decreased strength, Difficulty walking, Decreased activity tolerance, Increased muscle spasms, Postural dysfunction, Improper body mechanics, Increased fascial restricitons, Decreased balance, Abnormal gait  Visit Diagnosis: Muscle weakness (generalized)  Stiffness of right hip, not elsewhere classified     Problem List Patient Active Problem List   Diagnosis Date Noted  . Insomnia, persistent 07/14/2015  . Abnormal liver enzymes 07/14/2015  . HLD (hyperlipidemia) 07/14/2015  . Osteopenia 07/14/2015  . Cyst of ovary 07/14/2015  . Avitaminosis D 07/14/2015  . Chest pain 03/10/2011   Burnett Corrente, SPT This entire session was performed under direct supervision and direction of a licensed therapist/therapist assistant . I have personally  read, edited and approve of the note as written.  Trotter,Margaret PT, DPT 03/28/2017, 6:00 PM  [place] MAIN [facility] SERVICES [address] [place], [place], [address] Phone: [phone]   Fax:  [phone]  Name: SOMARA FRYMIRE MRN: [id number] Date of Birth: [birth year]

## 2017-03-28 NOTE — Patient Instructions (Addendum)
Spinal Mobility (Cat / Camel): Flexion / Extension    Get ON TARGET. Knees on full roller, hands on flat up roller:  1.Cat: Buttocks up, arch spine segmentally, bottom to top: lift chest as head moves back, look up. 2.Camel: Reverse movement. Close eyes, lower head, tuck chin, compress chest and abdomen, round back. Hold __3-5_ seconds, take a deep breath. Repeat _10__ times. Do _1_ sessions per day.  Note: do not go to end range on "cat" or rounded back part.  Copyright  VHI. All rights reserved.  Bird Dog Pose - Intermediate    Keep pelvis stable. From table pose, step one leg back to plank pose. Reach opposite arm forward, back foot off floor.  Hold for _2-3___ breaths. Return arm and leg at same rate. Repeat __4__ times each arm and leg, alternating sides.  Copyright  VHI. All rights reserved.

## 2017-03-30 ENCOUNTER — Ambulatory Visit: Payer: PRIVATE HEALTH INSURANCE | Admitting: Physical Therapy

## 2017-03-30 ENCOUNTER — Encounter: Payer: Self-pay | Admitting: Physical Therapy

## 2017-03-30 DIAGNOSIS — M5441 Lumbago with sciatica, right side: Secondary | ICD-10-CM

## 2017-03-30 DIAGNOSIS — M25651 Stiffness of right hip, not elsewhere classified: Secondary | ICD-10-CM

## 2017-03-30 DIAGNOSIS — M6281 Muscle weakness (generalized): Secondary | ICD-10-CM

## 2017-03-30 NOTE — Therapy (Signed)
[place] MAIN [facility] SERVICES [address] [place], [place], [address] Phone: [phone]   Fax:  [phone]  Physical Therapy Treatment  Patient Details  Name: Mary [name] MRN: [id number] Date of Birth: [birth year] Referring Provider: Dr.Gilbert  Encounter Date: 03/30/2017      PT End of Session - 03/30/17 0858    Visit Number 15   Number of Visits 28   Date for PT Re-Evaluation 04/13/17   Authorization Type No g codes   PT Start Time 0805   PT Stop Time 0852   PT Time Calculation (min) 47 min   Activity Tolerance Patient tolerated treatment well   Behavior During Therapy [facility] for tasks assessed/performed      Past Medical History:  Diagnosis Date  . UTI (lower urinary tract infection)     Past Surgical History:  Procedure Laterality Date  . TONSILLECTOMY    . TUBAL LIGATION      There were no vitals filed for this visit.      Subjective Assessment - 03/30/17 0807    Subjective Pt reports she is doing well. After last session she says that she felt good with decreased pain about an hour after the therapy session. She reports no changes since then. No pain at this time.    Pertinent History Hx of back pain, vagal nerve sensitivity (passing out)   Diagnostic tests X-Ray: Anterolisthesis l4-5   Currently in Pain? No/denies   Pain Onset Today   Multiple Pain Sites No       Therapeutic Exercise:  Prone:  Superman single limb x 2 each limb, alternating arm + leg x 8, all to improve extensor muscle tone/activation in the B trunk and hip for improved core stability, verbal and tactile cues for paraspinal muscle activation and leg extension. Pt was able to perform exercise with good body mechanics.   Supine: Pelvic tilt exercise with cues to use hands on hips to increase proprioception of hip position, and for deep breathing at end range for active stretch to decrease muscle tightness. Pt reports no increased pain.    There-ex: Step up exercise to side on stairs with UE support 3 x 4 steps each direction in order to strengthen glut med and glut min. Pt reports minor increase in hip pain to 2/10 indicating improvement in hip pain irritability.   Standing in parallel bars: Side stepping 4 x 10' with red t-band  Forward stepping 4 x 10' red t-band Backwards stepping 4 x 10 with red t-band cues for larger step  All for general hip strengthening and stability to correct muscle imbalance and to decrease hip pain. Cues for avoiding toeing out with extension to prevent compensation, cue to increase stride length to maximize efficacy of exercise.     Pt reports slightly increased pain around R PSIS up to 2/10, decreased compared to last visit. Ice applied x 10 min in supine, which alleviated pain (unbilled).            PT Long Term Goals - 03/16/17 0816      PT LONG TERM GOAL #1   Title Pt will be independent with HEP to demonstrate improvements after discharge from PT   Baseline Dependent with form/technique   Time 4   Period Weeks   Status Partially Met     PT LONG TERM GOAL #2   Title Patient will improve LEFS score to >75/80 to demonstrate significant improvement in LE and lumbar functioning.  Baseline --   Time 4   Period Weeks   Status Partially Met     PT LONG TERM GOAL #3   Title Patient will be able to stand for 2 hours without onset of pain to demonstrate significant improvement in lumbar and LE function.    Baseline able to stand for 1-1.5 hours without pain;    Time 4   Period Weeks   Status Partially Met     PT LONG TERM GOAL #4   Title Patient will have full AROM; equal to the L side to demonstrate significant improvement in lumbar functioning   Baseline See eval notes for AROM   Time 4   Period Weeks   Status Achieved               Plan - 03/30/17 0915    Clinical Impression Statement Pt instructed in hip strengthening exercise for general hip and trunk  stability in order to decrease hip pain.. Pt had decreased irritability of hip pain during session with pain increasing to only 2/10, which is decreased from last session. Pt is also having decreased severity of pain following activity with pain resolution within an hour of resting. Pt will continue to benefit from skilled therapy to decrease her hip pain and maximize activity tolerance with ADLs.    Rehab Potential Good   Clinical Impairments Affecting Rehab Potential (+)family/friend support; (-) age, chronicity of condition   PT Frequency 2x / week   PT Duration 4 weeks   PT Treatment/Interventions Patient/family education;Therapeutic activities;Therapeutic exercise;Balance training;ADLs/Self Care Home Management;Electrical Stimulation;Stair training;Neuromuscular re-education;Functional mobility training;Manual techniques;Traction;Moist Heat;Cryotherapy;Ultrasound;Passive range of motion;Dry needling   PT Next Visit Plan Improve lumbar mobility, core stab   PT Home Exercise Plan continue as given;    Consulted and Agree with Plan of Care Patient      Patient will benefit from skilled therapeutic intervention in order to improve the following deficits and impairments:  Decreased mobility, Decreased coordination, Decreased endurance, Decreased strength, Difficulty walking, Decreased activity tolerance, Increased muscle spasms, Postural dysfunction, Improper body mechanics, Increased fascial restricitons, Decreased balance, Abnormal gait  Visit Diagnosis: Muscle weakness (generalized)  Stiffness of right hip, not elsewhere classified  Right-sided low back pain with right-sided sciatica, unspecified chronicity     Problem List Patient Active Problem List   Diagnosis Date Noted  . Insomnia, persistent 07/14/2015  . Abnormal liver enzymes 07/14/2015  . HLD (hyperlipidemia) 07/14/2015  . Osteopenia 07/14/2015  . Cyst of ovary 07/14/2015  . Avitaminosis D 07/14/2015  . Chest pain  03/10/2011   Burnett Corrente, SPT This entire session was performed under direct supervision and direction of a licensed therapist/therapist assistant . I have personally read, edited and approve of the note as written.  Trotter,Margaret PT, DPT 03/30/2017, 5:25 PM  [place] MAIN [facility] SERVICES [address] [place], [place], [address] Phone: [phone]   Fax:  [phone]  Name: MARLYNN HINCKLEY MRN: [id number] Date of Birth: [birth year]

## 2017-03-30 NOTE — Patient Instructions (Addendum)
Extension: Superman    Alternating arms and legs as straight as possible, raise 1 arm and alternate leg simultaneously. Do __2__ sets. Complete _10___ repetitions.  http://st.exer.us/437   Copyright  VHI. All rights reserved.

## 2017-04-04 ENCOUNTER — Ambulatory Visit: Payer: PRIVATE HEALTH INSURANCE | Admitting: Physical Therapy

## 2017-04-04 ENCOUNTER — Encounter: Payer: Self-pay | Admitting: Physical Therapy

## 2017-04-04 ENCOUNTER — Encounter: Payer: PRIVATE HEALTH INSURANCE | Admitting: Physical Therapy

## 2017-04-04 DIAGNOSIS — M5441 Lumbago with sciatica, right side: Secondary | ICD-10-CM | POA: Diagnosis not present

## 2017-04-04 DIAGNOSIS — M25651 Stiffness of right hip, not elsewhere classified: Secondary | ICD-10-CM

## 2017-04-04 DIAGNOSIS — M6281 Muscle weakness (generalized): Secondary | ICD-10-CM

## 2017-04-04 NOTE — Therapy (Signed)
[place] MAIN [facility] SERVICES [address] [place], [place], [address] Phone: [phone]   Fax:  [phone]  Physical Therapy Treatment  Patient Details  Name: Mary [name] MRN: [id number] Date of Birth: Apr 22, 1951 Referring Provider: Dr.Gilbert  Encounter Date: 04/04/2017      PT End of Session - 04/04/17 1028    Visit Number 16   Number of Visits 28   Date for PT Re-Evaluation 04/13/17   Authorization Type No g codes   PT Start Time 0948   PT Stop Time 1032   PT Time Calculation (min) 44 min   Activity Tolerance Patient tolerated treatment well   Behavior During Therapy [facility] for tasks assessed/performed      Past Medical History:  Diagnosis Date  . UTI (lower urinary tract infection)     Past Surgical History:  Procedure Laterality Date  . TONSILLECTOMY    . TUBAL LIGATION      There were no vitals filed for this visit.      Subjective Assessment - 04/04/17 0952    Subjective Pt reports she has not done any stretching this morning and feels more discomfort than usual with walking. Worst pain this week has been 4-5/10 but resolved in a few hours. Pt reports she does her HEP most days, but that she is too busy on occasion. Pt can walk several miles before her hip starts to bother her, but on occasion she has had her shopping limited by pain.    Pertinent History Hx of back pain, vagal nerve sensitivity (passing out)   Diagnostic tests X-Ray: Anterolisthesis l4-5   Currently in Pain? Yes   Pain Score 2    Pain Location Hip   Pain Orientation Right   Pain Descriptors / Indicators Aching;Sharp   Pain Type Chronic pain   Pain Radiating Towards Right hip   Pain Onset Today   Pain Frequency Occasional   Aggravating Factors  Prolonged walking   Pain Relieving Factors heat, icy hot   Effect of Pain on Daily Activities Limited activity tolerance   Multiple Pain Sites No      Therapeutic Exercise: (Pt with increased pain at  start of session)  Circuits training:  Prone:  Superman 1 x 15 x 5 sec hold, and 1 x 10 x 3 sec hold each side with alternate arm + leg, to improve extensor muscle tone/activation in the B trunk and hip for improved core stability, verbal and tactile cues for paraspinal muscle activation and leg extension. Pt was able to perform exercise with good body mechanics and no increased pain.  Stretching in Supine to decrease hip pain: Piriformis/figure 4 stretch 2 x 30 with supervision and verbal cues for hold time and form. Hamstring stretch contract-relax 5 sec / 10 sec x 3 each side to decrease hamstring tightness and decrease pain.  Pt reported decreased hip pain following stretching.  Supine: Pelvic tilt exercise 1 x 10 with cues to use hands on hips to increase proprioception of hip position, and for deep breathing at end range for active stretch to decrease muscle tightness. Pt reports no increased pain.   There-ex: Monster Walk 2 sets of 4 x 15'  with green t-band, cues for larger step, avoiding hip ER.  All for general hip strengthening and stability to correct muscle imbalance and to decrease hip pain.    Pt reports slightly increased pain around R PSIS up to 4/10  Ice applied x 10 min in supine,  which alleviated pain (unbilled).           PT Education - 04/04/17 1004    Education provided Yes   Education Details Stretching, circuit trainig, HEP updated   Person(s) Educated Patient   Methods Explanation;Demonstration;Tactile cues;Verbal cues   Comprehension Verbal cues required;Tactile cues required;Returned demonstration;Verbalized understanding             PT Long Term Goals - 04/04/17 1449      PT LONG TERM GOAL #1   Title Pt will be independent with HEP to demonstrate improvements after discharge from PT   Baseline Dependent with form/technique   Time 4   Period Weeks   Status Partially Met     PT LONG TERM GOAL #2   Title Patient will improve LEFS score  to >75/80 to demonstrate significant improvement in LE and lumbar functioning.    Time 4   Period Weeks   Status Partially Met     PT LONG TERM GOAL #3   Title Patient will be able to stand for 2 hours without onset of pain to demonstrate significant improvement in lumbar and LE function.    Baseline able to stand for 1-1.5 hours without pain;    Time 4   Period Weeks   Status Partially Met     PT LONG TERM GOAL #4   Title Patient will have full AROM; equal to the L side to demonstrate significant improvement in lumbar functioning   Time 4   Period Weeks   Status Achieved               Plan - 04/04/17 1140    Clinical Impression Statement Pt instructed in circuit training for continued hip strengthening and activity tolerance. Pt reported increased hip pain at beginning of session to 3/10, which she attributes to having not stretched this morning. Therapist discussed HEP and therapy progression with pt and decided together that the pt is appropriate to go on hold for several weeks. Her pain frequency, severity, and irritability have all decreased to tolerable levels. She will continue with ther-ex independently at home to address her remaining deficits in hip pain and activity tolerance. Pt will contact therapist with any change in status and the patient will be reassessed in several weeks if appropriate.    Rehab Potential Good   Clinical Impairments Affecting Rehab Potential (+)family/friend support; (-) age, chronicity of condition   PT Frequency 2x / week   PT Duration 4 weeks   PT Treatment/Interventions Patient/family education;Therapeutic activities;Therapeutic exercise;Balance training;ADLs/Self Care Home Management;Electrical Stimulation;Stair training;Neuromuscular re-education;Functional mobility training;Manual techniques;Traction;Moist Heat;Cryotherapy;Ultrasound;Passive range of motion;Dry needling   PT Next Visit Plan Improve lumbar mobility, core stab   PT Home  Exercise Plan continue as given;    Consulted and Agree with Plan of Care Patient      Patient will benefit from skilled therapeutic intervention in order to improve the following deficits and impairments:  Decreased mobility, Decreased coordination, Decreased endurance, Decreased strength, Difficulty walking, Decreased activity tolerance, Increased muscle spasms, Postural dysfunction, Improper body mechanics, Increased fascial restricitons, Decreased balance, Abnormal gait  Visit Diagnosis: Muscle weakness (generalized)  Stiffness of right hip, not elsewhere classified  Right-sided low back pain with right-sided sciatica, unspecified chronicity     Problem List Patient Active Problem List   Diagnosis Date Noted  . Insomnia, persistent 07/14/2015  . Abnormal liver enzymes 07/14/2015  . HLD (hyperlipidemia) 07/14/2015  . Osteopenia 07/14/2015  . Cyst of ovary 07/14/2015  . Avitaminosis D  07/14/2015  . Chest pain 03/10/2011   Burnett Corrente, SPT This entire session was performed under direct supervision and direction of a licensed therapist/therapist assistant . I have personally read, edited and approve of the note as written.  Trotter,Margaret PT, DPT 04/04/2017, 2:50 PM  [place] MAIN [facility] SERVICES [address] [place], [place], [address] Phone: [phone]   Fax:  [phone]  Name: Mary [name] MRN: [id number] Date of Birth: 1951/07/10

## 2017-04-11 DIAGNOSIS — H40003 Preglaucoma, unspecified, bilateral: Secondary | ICD-10-CM | POA: Diagnosis not present

## 2017-07-20 DIAGNOSIS — H1859 Other hereditary corneal dystrophies: Secondary | ICD-10-CM | POA: Diagnosis not present

## 2017-07-26 DIAGNOSIS — H1859 Other hereditary corneal dystrophies: Secondary | ICD-10-CM | POA: Diagnosis not present

## 2017-10-18 DIAGNOSIS — L570 Actinic keratosis: Secondary | ICD-10-CM | POA: Diagnosis not present

## 2017-10-18 DIAGNOSIS — L578 Other skin changes due to chronic exposure to nonionizing radiation: Secondary | ICD-10-CM | POA: Diagnosis not present

## 2017-10-18 DIAGNOSIS — L218 Other seborrheic dermatitis: Secondary | ICD-10-CM | POA: Diagnosis not present

## 2017-10-18 DIAGNOSIS — D485 Neoplasm of uncertain behavior of skin: Secondary | ICD-10-CM | POA: Diagnosis not present

## 2017-10-18 DIAGNOSIS — L821 Other seborrheic keratosis: Secondary | ICD-10-CM | POA: Diagnosis not present

## 2017-11-02 DIAGNOSIS — D485 Neoplasm of uncertain behavior of skin: Secondary | ICD-10-CM | POA: Diagnosis not present

## 2017-11-08 DIAGNOSIS — H40003 Preglaucoma, unspecified, bilateral: Secondary | ICD-10-CM | POA: Diagnosis not present

## 2017-11-17 ENCOUNTER — Other Ambulatory Visit: Payer: Self-pay | Admitting: Family Medicine

## 2017-11-17 NOTE — Telephone Encounter (Signed)
Pt requesting refill of temazapam 30 MG sent to [facility] in [facility]

## 2017-11-20 MED ORDER — TEMAZEPAM 30 MG PO CAPS: 30.0000 mg | ORAL_CAPSULE | Freq: Every evening | ORAL | 0 refills | Status: DC | PRN

## 2017-11-21 ENCOUNTER — Telehealth: Payer: Self-pay | Admitting: Family Medicine

## 2017-11-21 NOTE — Telephone Encounter (Signed)
Mary [name] has her husband [employer] as her POA

## 2017-11-21 NOTE — Telephone Encounter (Signed)
Per [name]'s atty, if you will write a letter stating Mary [name] has dementia and is not able to be her POA, Mary [name] can attach this to her POA papers so her Daughter Mary [name] will be her POA.     Call when letter ready please.   [phone]

## 2017-11-22 NOTE — Telephone Encounter (Signed)
Please advise. Thanks.  

## 2017-12-03 NOTE — Telephone Encounter (Signed)
Letter written in husbands chart.

## 2017-12-04 NOTE — Telephone Encounter (Signed)
Copy placed up front for wife to pick up.

## 2017-12-12 DIAGNOSIS — D2272 Melanocytic nevi of left lower limb, including hip: Secondary | ICD-10-CM | POA: Diagnosis not present

## 2018-01-17 DIAGNOSIS — H401132 Primary open-angle glaucoma, bilateral, moderate stage: Secondary | ICD-10-CM | POA: Diagnosis not present

## 2018-03-07 DIAGNOSIS — H409 Unspecified glaucoma: Secondary | ICD-10-CM | POA: Diagnosis not present

## 2018-05-10 ENCOUNTER — Encounter: Payer: Self-pay | Admitting: Physician Assistant

## 2018-05-10 ENCOUNTER — Ambulatory Visit (INDEPENDENT_AMBULATORY_CARE_PROVIDER_SITE_OTHER): Payer: Medicare Other | Admitting: Physician Assistant

## 2018-05-10 VITALS — BP 112/60 | HR 58 | Temp 97.9°F | Resp 16 | Wt 183.2 lb

## 2018-05-10 DIAGNOSIS — L237 Allergic contact dermatitis due to plants, except food: Secondary | ICD-10-CM | POA: Diagnosis not present

## 2018-05-10 MED ORDER — PREDNISONE 10 MG (21) PO TBPK: ORAL_TABLET | ORAL | 0 refills | Status: DC

## 2018-05-10 NOTE — Patient Instructions (Signed)
Poison Oak Dermatitis Poison oak dermatitis is redness and soreness (inflammation) of the skin. It is caused by a chemical that is on the leaves of the poison oak plant. You may also have itching, a rash, and blisters. Symptoms often clear up in 1-2 weeks. You may get this condition by touching a poison oak plant. You can also get it by touching something that has the chemical on it. This may include animals or objects that have come in contact with the plant. Follow these instructions at home: General instructions  Take or apply over-the-counter and prescription medicines only as told by your doctor.  If you touch poison oak, wash your skin with soap and cold water right away.  Use hydrocortisone creams or calamine lotion as needed to help with itching.  Take oatmeal baths as needed. Use colloidal oatmeal. You can get this at a pharmacy or grocery store. Follow the instructions on the package.  Do not scratch or rub your skin.  While you have the rash, wash your clothes right after you wear them. Prevention   Know what poison oak looks like so you can avoid it. This plant has three leaves with flowering branches on a single stem. The leaves are fuzzy. They have a toothlike edge.  If you have touched poison oak, wash with soap and water right away. Be sure to wash under your fingernails.  When hiking or camping, wear long pants, a long-sleeved shirt, tall socks, and hiking boots. You can also use a lotion on your skin that helps to prevent contact with the chemical on the plant.  If you think that your clothes or outdoor gear came in contact with poison oak, rinse them off with a garden hose before you bring them inside your house. Contact a doctor if:  You have open sores in the rash area.  You have more redness, swelling, or pain in the affected area.  You have redness that spreads beyond the rash area.  You have fluid, blood, or pus coming from the affected area.  You have a  fever.  You have a rash over a large area of your body.  You have a rash on your eyes, mouth, or genitals.  Your rash does not improve after a few days. Get help right away if:  Your face swells or your eyes swell shut.  You have trouble breathing.  You have trouble swallowing. This information is not intended to replace advice given to you by your health care provider. Make sure you discuss any questions you have with your health care provider. Document Released: 10/29/2010 Document Revised: 03/03/2016 Document Reviewed: 03/04/2015 Elsevier Interactive Patient Education  2018 [employer]  

## 2018-05-10 NOTE — Progress Notes (Signed)
Patient: Mary [name] Female    DOB: 04-22-1951   67 y.o.   MRN: [id number] Visit Date: 05/10/2018  Today's Provider: Mar Daring, PA-C   Chief Complaint  Patient presents with  . Rash   Subjective:    Rash  This is a new problem. The current episode started in the past 7 days (Notice it on Monday. She reports that it started to break out a little over the weekend.). The rash is diffuse. She was exposed to plant contact. Pertinent negatives include no cough, eye pain, facial edema, fever, joint pain or shortness of breath. Past treatments include topical steroids. The treatment provided no relief.      Allergies  Allergen Reactions  . Amoxicillin Rash  . Hydrocodone-Acetaminophen Rash     Current Outpatient Medications:  .  latanoprost (XALATAN) 0.005 % ophthalmic solution, Apply to eye., Disp: , Rfl:  .  temazepam (RESTORIL) 30 MG capsule, Take 1 capsule (30 mg total) by mouth at bedtime as needed for sleep., Disp: 30 capsule, Rfl: 0 .  conjugated estrogens (PREMARIN) vaginal cream, Place 1 Applicatorful vaginally daily. (Patient not taking: Reported on 05/10/2018), Disp: 42.5 g, Rfl: 12 .  cyclobenzaprine (FLEXERIL) 10 MG tablet, Take 1 tablet (10 mg total) by mouth at bedtime. (Patient not taking: Reported on 05/10/2018), Disp: 30 tablet, Rfl: 1 .  mometasone (ELOCON) 0.1 % cream, Apply topically daily. Apply to affected area.. (Patient not taking: Reported on 05/10/2018), Disp: 45 g, Rfl: 0 .  naproxen (NAPROSYN) 500 MG tablet, Take 1 tablet (500 mg total) by mouth 2 (two) times daily with a meal. (Patient not taking: Reported on 05/10/2018), Disp: 60 tablet, Rfl: 1  Review of Systems  Constitutional: Negative.  Negative for fever.  Eyes: Negative for pain.  Respiratory: Negative for cough, chest tightness and shortness of breath.   Cardiovascular: Negative for chest pain, palpitations and leg swelling.  Musculoskeletal: Negative for joint pain.  Skin: Positive for  rash.  Neurological: Negative.     Social History   Tobacco Use  . Smoking status: Never Smoker  . Smokeless tobacco: Never Used  Substance Use Topics  . Alcohol use: Yes    Alcohol/week: 0.6 oz    Types: 1 Standard drinks or equivalent per week    Comment: 1 drink a day   Objective:   BP 112/60 (BP Location: Left Arm, Patient Position: Sitting, Cuff Size: Normal)   Pulse (!) 58   Temp 97.9 F (36.6 C) (Oral)   Resp 16   Wt 183 lb 3.2 oz (83.1 kg)   SpO2 96%   BMI 29.57 kg/m  Vitals:   05/10/18 1126  BP: 112/60  Pulse: (!) 58  Resp: 16  Temp: 97.9 F (36.6 C)  TempSrc: Oral  SpO2: 96%  Weight: 183 lb 3.2 oz (83.1 kg)     Physical Exam  Constitutional: She appears well-developed and well-nourished. No distress.  Neck: Normal range of motion. Neck supple.  Cardiovascular: Normal rate, regular rhythm and normal heart sounds. Exam reveals no gallop and no friction rub.  No murmur heard. Pulmonary/Chest: Effort normal and breath sounds normal. No respiratory distress. She has no wheezes. She has no rales.  Skin: Rash noted. Rash is papular and vesicular. She is not diaphoretic.     Vitals reviewed.       Assessment & Plan:     1. Allergic dermatitis due to poison oak Failed OTC treatments. Will give steroid taper  as below. May use her mometasone cream as well on rash for itching. Call if no improvements.  - predniSONE (STERAPRED UNI-PAK 21 TAB) 10 MG (21) TBPK tablet; 6 day taper; take as directed on package instructions  Dispense: 21 tablet; Refill: 0       Mar Daring, PA-C  [facility]

## 2018-06-19 DIAGNOSIS — L821 Other seborrheic keratosis: Secondary | ICD-10-CM | POA: Diagnosis not present

## 2018-06-19 DIAGNOSIS — Z86018 Personal history of other benign neoplasm: Secondary | ICD-10-CM | POA: Diagnosis not present

## 2018-06-19 DIAGNOSIS — L578 Other skin changes due to chronic exposure to nonionizing radiation: Secondary | ICD-10-CM | POA: Diagnosis not present

## 2018-06-19 DIAGNOSIS — Z872 Personal history of diseases of the skin and subcutaneous tissue: Secondary | ICD-10-CM | POA: Diagnosis not present

## 2018-06-19 DIAGNOSIS — Z1283 Encounter for screening for malignant neoplasm of skin: Secondary | ICD-10-CM | POA: Diagnosis not present

## 2018-07-11 ENCOUNTER — Ambulatory Visit: Payer: Self-pay

## 2018-07-18 ENCOUNTER — Ambulatory Visit (INDEPENDENT_AMBULATORY_CARE_PROVIDER_SITE_OTHER): Payer: Medicare Other

## 2018-07-18 DIAGNOSIS — Z23 Encounter for immunization: Secondary | ICD-10-CM | POA: Diagnosis not present

## 2018-08-01 ENCOUNTER — Telehealth: Payer: Self-pay | Admitting: Family Medicine

## 2018-08-01 NOTE — Telephone Encounter (Signed)
Done

## 2018-08-01 NOTE — Telephone Encounter (Signed)
Pt is returning call regarding jury letter that Jiles Garter is helping her with.  Please call pt back.  Pt will have her phone with her.  Thanks, [employer]

## 2018-08-15 DIAGNOSIS — L259 Unspecified contact dermatitis, unspecified cause: Secondary | ICD-10-CM | POA: Diagnosis not present

## 2018-08-22 DIAGNOSIS — L259 Unspecified contact dermatitis, unspecified cause: Secondary | ICD-10-CM | POA: Diagnosis not present

## 2018-08-29 ENCOUNTER — Ambulatory Visit (INDEPENDENT_AMBULATORY_CARE_PROVIDER_SITE_OTHER): Payer: Medicare Other

## 2018-08-29 VITALS — BP 92/58 | HR 61 | Temp 98.2°F | Ht 66.0 in | Wt 182.0 lb

## 2018-08-29 DIAGNOSIS — Z0000 Encounter for general adult medical examination without abnormal findings: Secondary | ICD-10-CM | POA: Diagnosis not present

## 2018-08-29 DIAGNOSIS — Z1231 Encounter for screening mammogram for malignant neoplasm of breast: Secondary | ICD-10-CM | POA: Diagnosis not present

## 2018-08-29 DIAGNOSIS — Z1211 Encounter for screening for malignant neoplasm of colon: Secondary | ICD-10-CM | POA: Diagnosis not present

## 2018-08-29 NOTE — Patient Instructions (Addendum)
Mary [name] , Thank you for taking time to come for your Medicare Wellness Visit. I appreciate your ongoing commitment to your health goals. Please review the following plan we discussed and let me know if I can assist you in the future.   Screening recommendations/referrals: Colonoscopy: Referral sent today.  Mammogram: Order sent today.  Bone Density: Up to date, due 08/2020 Recommended yearly ophthalmology/optometry visit for glaucoma screening and checkup Recommended yearly dental visit for hygiene and checkup  Vaccinations: Influenza vaccine: Up to date Pneumococcal vaccine: Completed series  Tdap vaccine: Up to date, due 01/2020 Shingles vaccine: Pt declines today.     Advanced directives: Please bring a copy of your POA (Power of Attorney) and/or Living Will to your next appointment.   Conditions/risks identified: Obesity- recommend eating 3 small meals a day with 2 healthy snacks in between. Avoids sugars, high sodium snacks, junk foods and extra snacking.   Next appointment: 09/18/18 @ 2 PM with Dr Rosanna Randy.    Preventive Care 39 Years and Older, Female Preventive care refers to lifestyle choices and visits with your health care provider that can promote health and wellness. What does preventive care include?  A yearly physical exam. This is also called an annual well check.  Dental exams once or twice a year.  Routine eye exams. Ask your health care provider how often you should have your eyes checked.  Personal lifestyle choices, including:  Daily care of your teeth and gums.  Regular physical activity.  Eating a healthy diet.  Avoiding tobacco and drug use.  Limiting alcohol use.  Practicing safe sex.  Taking low-dose aspirin every day.  Taking vitamin and mineral supplements as recommended by your health care provider. What happens during an annual well check? The services and screenings done by your health care provider during your annual well check will  depend on your age, overall health, lifestyle risk factors, and family history of disease. Counseling  Your health care provider may ask you questions about your:  Alcohol use.  Tobacco use.  Drug use.  Emotional well-being.  Home and relationship well-being.  Sexual activity.  Eating habits.  History of falls.  Memory and ability to understand (cognition).  Work and work Statistician.  Reproductive health. Screening  You may have the following tests or measurements:  Height, weight, and BMI.  Blood pressure.  Lipid and cholesterol levels. These may be checked every 5 years, or more frequently if you are over 94 years old.  Skin check.  Lung cancer screening. You may have this screening every year starting at age 35 if you have a 30-pack-year history of smoking and currently smoke or have quit within the past 15 years.  Fecal occult blood test (FOBT) of the stool. You may have this test every year starting at age 1.  Flexible sigmoidoscopy or colonoscopy. You may have a sigmoidoscopy every 5 years or a colonoscopy every 10 years starting at age 40.  Hepatitis C blood test.  Hepatitis B blood test.  Sexually transmitted disease (STD) testing.  Diabetes screening. This is done by checking your blood sugar (glucose) after you have not eaten for a while (fasting). You may have this done every 1-3 years.  Bone density scan. This is done to screen for osteoporosis. You may have this done starting at age 7.  Mammogram. This may be done every 1-2 years. Talk to your health care provider about how often you should have regular mammograms. Talk with your health care provider  about your test results, treatment options, and if necessary, the need for more tests. Vaccines  Your health care provider may recommend certain vaccines, such as:  Influenza vaccine. This is recommended every year.  Tetanus, diphtheria, and acellular pertussis (Tdap, Td) vaccine. You may need a  Td booster every 10 years.  Zoster vaccine. You may need this after age 3.  Pneumococcal 13-valent conjugate (PCV13) vaccine. One dose is recommended after age 41.  Pneumococcal polysaccharide (PPSV23) vaccine. One dose is recommended after age 63. Talk to your health care provider about which screenings and vaccines you need and how often you need them. This information is not intended to replace advice given to you by your health care provider. Make sure you discuss any questions you have with your health care provider. Document Released: 10/23/2015 Document Revised: 06/15/2016 Document Reviewed: 07/28/2015 Elsevier Interactive Patient Education  2017 [place] Prevention in the Home Falls can cause injuries. They can happen to people of all ages. There are many things you can do to make your home safe and to help prevent falls. What can I do on the outside of my home?  Regularly fix the edges of walkways and driveways and fix any cracks.  Remove anything that might make you trip as you walk through a door, such as a raised step or threshold.  Trim any bushes or trees on the path to your home.  Use bright outdoor lighting.  Clear any walking paths of anything that might make someone trip, such as rocks or tools.  Regularly check to see if handrails are loose or broken. Make sure that both sides of any steps have handrails.  Any raised decks and porches should have guardrails on the edges.  Have any leaves, snow, or ice cleared regularly.  Use sand or salt on walking paths during winter.  Clean up any spills in your garage right away. This includes oil or grease spills. What can I do in the bathroom?  Use night lights.  Install grab bars by the toilet and in the tub and shower. Do not use towel bars as grab bars.  Use non-skid mats or decals in the tub or shower.  If you need to sit down in the shower, use a plastic, non-slip stool.  Keep the floor dry. Clean  up any water that spills on the floor as soon as it happens.  Remove soap buildup in the tub or shower regularly.  Attach bath mats securely with double-sided non-slip rug tape.  Do not have throw rugs and other things on the floor that can make you trip. What can I do in the bedroom?  Use night lights.  Make sure that you have a light by your bed that is easy to reach.  Do not use any sheets or blankets that are too big for your bed. They should not hang down onto the floor.  Have a firm chair that has side arms. You can use this for support while you get dressed.  Do not have throw rugs and other things on the floor that can make you trip. What can I do in the kitchen?  Clean up any spills right away.  Avoid walking on wet floors.  Keep items that you use a lot in easy-to-reach places.  If you need to reach something above you, use a strong step stool that has a grab bar.  Keep electrical cords out of the way.  Do not use floor polish or  wax that makes floors slippery. If you must use wax, use non-skid floor wax.  Do not have throw rugs and other things on the floor that can make you trip. What can I do with my stairs?  Do not leave any items on the stairs.  Make sure that there are handrails on both sides of the stairs and use them. Fix handrails that are broken or loose. Make sure that handrails are as long as the stairways.  Check any carpeting to make sure that it is firmly attached to the stairs. Fix any carpet that is loose or worn.  Avoid having throw rugs at the top or bottom of the stairs. If you do have throw rugs, attach them to the floor with carpet tape.  Make sure that you have a light switch at the top of the stairs and the bottom of the stairs. If you do not have them, ask someone to add them for you. What else can I do to help prevent falls?  Wear shoes that:  Do not have high heels.  Have rubber bottoms.  Are comfortable and fit you well.  Are  closed at the toe. Do not wear sandals.  If you use a stepladder:  Make sure that it is fully opened. Do not climb a closed stepladder.  Make sure that both sides of the stepladder are locked into place.  Ask someone to hold it for you, if possible.  Clearly mark and make sure that you can see:  Any grab bars or handrails.  First and last steps.  Where the edge of each step is.  Use tools that help you move around (mobility aids) if they are needed. These include:  Canes.  Walkers.  Scooters.  Crutches.  Turn on the lights when you go into a dark area. Replace any light bulbs as soon as they burn out.  Set up your furniture so you have a clear path. Avoid moving your furniture around.  If any of your floors are uneven, fix them.  If there are any pets around you, be aware of where they are.  Review your medicines with your doctor. Some medicines can make you feel dizzy. This can increase your chance of falling. Ask your doctor what other things that you can do to help prevent falls. This information is not intended to replace advice given to you by your health care provider. Make sure you discuss any questions you have with your health care provider. Document Released: 07/23/2009 Document Revised: 03/03/2016 Document Reviewed: 10/31/2014 Elsevier Interactive Patient Education  2017 [employer].

## 2018-08-29 NOTE — Progress Notes (Signed)
Subjective:   Mary [name] is a 67 y.o. female who presents for an Initial Medicare Annual Wellness Visit.  Review of Systems    N/A  Cardiac Risk Factors include: advanced age (>15men, >64 women);dyslipidemia     Objective:    Today's Vitals   08/29/18 1444  BP: (!) 92/58  Pulse: 61  Temp: 98.2 F (36.8 C)  TempSrc: Oral  Weight: 182 lb (82.6 kg)  Height: 5\' 6"  (1.676 m)  PainSc: 0-No pain   Body mass index is 29.38 kg/m.  Advanced Directives 08/29/2018 01/05/2017  Does Patient Have a Medical Advance Directive? Yes No  Type of Paramedic of [place];Living will -  Copy of [place] in Chart? No - copy requested -    Current Medications (verified) Outpatient Encounter Medications as of 08/29/2018  Medication Sig  . ALAWAY 0.025 % ophthalmic solution APPLY 1 DROP(S) IN BOTH EYES 2 TIMES A DAY AS NEEDED  . neomycin-polymyxin b-dexamethasone (MAXITROL) 3.5-10000-0.1 OINT Place 1 application into both eyes at bedtime.   Marland Kitchen SIMBRINZA 1-0.2 % SUSP Place 1 drop into both eyes 2 (two) times daily.  . temazepam (RESTORIL) 30 MG capsule Take 1 capsule (30 mg total) by mouth at bedtime as needed for sleep.  Marland Kitchen conjugated estrogens (PREMARIN) vaginal cream Place 1 Applicatorful vaginally daily. (Patient not taking: Reported on 05/10/2018)  . cyclobenzaprine (FLEXERIL) 10 MG tablet Take 1 tablet (10 mg total) by mouth at bedtime. (Patient not taking: Reported on 05/10/2018)  . latanoprost (XALATAN) 0.005 % ophthalmic solution Apply to eye.  . mometasone (ELOCON) 0.1 % cream Apply topically daily. Apply to affected area.. (Patient not taking: Reported on 05/10/2018)  . naproxen (NAPROSYN) 500 MG tablet Take 1 tablet (500 mg total) by mouth 2 (two) times daily with a meal. (Patient not taking: Reported on 05/10/2018)  . predniSONE (STERAPRED UNI-PAK 21 TAB) 10 MG (21) TBPK tablet 6 day taper; take as directed on package instructions (Patient not taking:  Reported on 08/29/2018)   No facility-administered encounter medications on file as of 08/29/2018.     Allergies (verified) Amoxicillin and Hydrocodone-acetaminophen   History: Past Medical History:  Diagnosis Date  . Hyperlipidemia   . UTI (lower urinary tract infection)    Past Surgical History:  Procedure Laterality Date  . TONSILLECTOMY    . TUBAL LIGATION     Family History  Problem Relation Age of Onset  . Heart disease Mother   . Diabetes Mother   . Alzheimer's disease Mother   . Heart attack Father   . Heart disease Father   . Skin cancer Father   . COPD Father   . Hypertension Brother   . Heart disease Brother   . Parkinson's disease Maternal Grandmother   . Breast cancer Neg Hx    Social History   Socioeconomic History  . Marital status: Married    Spouse name: Not on file  . Number of children: 2  . Years of education: Not on file  . Highest education level: Associate degree: occupational, Hotel manager, or vocational program  Occupational History  . Occupation: retired  Scientific laboratory technician  . Financial resource strain: Not hard at all  . Food insecurity:    Worry: Never true    Inability: Never true  . Transportation needs:    Medical: No    Non-medical: No  Tobacco Use  . Smoking status: Never Smoker  . Smokeless tobacco: Never Used  Substance and Sexual Activity  .  Alcohol use: Yes    Alcohol/week: 7.0 standard drinks    Types: 7 Glasses of wine per week    Comment: 1 drink a day  . Drug use: No  . Sexual activity: Not on file  Lifestyle  . Physical activity:    Days per week: 2 days    Minutes per session: 60 min  . Stress: To some extent  Relationships  . Social connections:    Talks on phone: Patient refused    Gets together: Patient refused    Attends religious service: Patient refused    Active member of club or organization: Patient refused    Attends meetings of clubs or organizations: Patient refused    Relationship status: Patient  refused  Other Topics Concern  . Not on file  Social History Narrative  . Not on file    Tobacco Counseling Counseling given: Not Answered   Clinical Intake:  Pre-visit preparation completed: Yes  Pain : No/denies pain Pain Score: 0-No pain     Nutritional Status: BMI 25 -29 Overweight Nutritional Risks: None Diabetes: No  How often do you need to have someone help you when you read instructions, pamphlets, or other written materials from your doctor or pharmacy?: 1 - Never  Interpreter Needed?: No  Information entered by :: [facility], LPN   Activities of Daily Living In your present state of health, do you have any difficulty performing the following activities: 08/29/2018  Hearing? N  Vision? N  Comment Wears eye glasses   Difficulty concentrating or making decisions? N  Walking or climbing stairs? N  Dressing or bathing? N  Doing errands, shopping? N  Preparing Food and eating ? N  Using the Toilet? N  In the past six months, have you accidently leaked urine? N  Do you have problems with loss of bowel control? N  Managing your Medications? N  Managing your Finances? N  Housekeeping or managing your Housekeeping? N  Some recent data might be hidden     Immunizations and Health Maintenance Immunization History  Administered Date(s) Administered  . Influenza, High Dose Seasonal PF 07/18/2018  . Pneumococcal Conjugate-13 12/26/2016  . Pneumococcal Polysaccharide-23 07/18/2018  . Td 05/31/2005  . Tdap 02/01/2010  . Zoster 08/13/2015   Health Maintenance Due  Topic Date Due  . MAMMOGRAM  08/12/2017  . COLONOSCOPY  10/18/2017    Patient Care Team: Jerrol Banana., MD as PCP - General (Unknown Physician Specialty) [place], [place] (Optometry) Joseph, Cori Razor, MD (Dermatology)  Indicate any recent Medical Services you may have received from other than [facility] providers in the past year (date may be approximate).     Assessment:    This is a routine wellness examination for Mary [name].  Hearing/Vision screen No exam data present  Dietary issues and exercise activities discussed: Current Exercise Habits: Home exercise routine, Type of exercise: walking, Time (Minutes): 60, Frequency (Times/Week): 2(to 3 days), Weekly Exercise (Minutes/Week): 120, Intensity: Mild, Exercise limited by: None identified  Goals    . Have 3 meals a day     Recommend eating 3 small meals a day with 2 healthy snacks in between. Avoids sugars, high sodium snacks, junk foods and extra snacking.       Depression Screen PHQ 2/9 Scores 08/29/2018 12/26/2016 12/26/2016 08/13/2015  PHQ - 2 Score 2 0 0 0  PHQ- 9 Score 7 0 - -    Fall Risk Fall Risk  08/29/2018 12/26/2016 08/13/2015  Falls in the  past year? 0 No No   FALL RISK PREVENTION PERTAINING TO THE HOME:  Any stairs in or around the home WITH handrails? Yes  Home free of loose throw rugs in walkways, pet beds, electrical cords, etc? Yes  Adequate lighting in your home to reduce risk of falls? Yes   ASSISTIVE DEVICES UTILIZED TO PREVENT FALLS:  Life alert? No  Use of a cane, walker or w/c? No  Grab bars in the bathroom? No  Shower chair or bench in shower? No  Elevated toilet seat or a handicapped toilet? No    TIMED UP AND GO:  Was the test performed? No .    Cognitive Function: Declined today.         Screening Tests Health Maintenance  Topic Date Due  . MAMMOGRAM  08/12/2017  . COLONOSCOPY  10/18/2017  . TETANUS/TDAP  02/02/2020  . DEXA SCAN  08/12/2020  . INFLUENZA VACCINE  Completed  . Hepatitis C Screening  Completed  . PNA vac Low Risk Adult  Completed    Qualifies for Shingles Vaccine? Yes  Zostavax completed 08/13/15. Due for Shingrix. Education has been provided regarding the importance of this vaccine. Pt has been advised to call insurance company to determine out of pocket expense. Advised may also receive vaccine at local pharmacy or Health Dept. Verbalized  acceptance and understanding.  Tdap: Up to date  Flu Vaccine: Up to date  Pneumococcal Vaccine: Up to date   Cancer Screenings:  Colorectal Screening: Completed 10/18/17. Repeat every 10 years; Referral to GI placed today. Pt aware the office will call re: appt.  Mammogram: Completed 08/13/15. Repeat every year; Ordered today. Pt provided with contact info and advised to call to schedule appt.   Bone Density: Completed 08/13/15. Results reflect OSTEOPENIA. Repeat every 5 years.   Lung Cancer Screening: (Low Dose CT Chest recommended if Age 79-80 years, 30 pack-year currently smoking OR have quit w/in 15years.) does not qualify.    Additional Screening:  Hepatitis C Screening: Up to date  Vision Screening: Recommended annual ophthalmology exams for early detection of glaucoma and other disorders of the eye.  Dental Screening: Recommended annual dental exams for proper oral hygiene  Community Resource Referral:  CRR required this visit?  No       Plan:  I have personally reviewed and addressed the Medicare Annual Wellness questionnaire and have noted the following in the patient's chart:  A. Medical and social history B. Use of alcohol, tobacco or illicit drugs  C. Current medications and supplements D. Functional ability and status E.  Nutritional status F.  Physical activity G. Advance directives H. List of other physicians I.  Hospitalizations, surgeries, and ER visits in previous 12 months J.  [place] such as hearing and vision if needed, cognitive and depression L. Referrals and appointments - none  In addition, I have reviewed and discussed with patient certain preventive protocols, quality metrics, and best practice recommendations. A written personalized care plan for preventive services as well as general preventive health recommendations were provided to patient.  See attached scanned questionnaire for additional information.   Signed,    Fabio Neighbors, LPN Nurse Health Advisor   Nurse Recommendations: None. Referral placed for colonoscopy and order sent for mammogram.

## 2018-09-03 DIAGNOSIS — H40003 Preglaucoma, unspecified, bilateral: Secondary | ICD-10-CM | POA: Diagnosis not present

## 2018-09-04 ENCOUNTER — Ambulatory Visit
Admission: RE | Admit: 2018-09-04 | Discharge: 2018-09-04 | Disposition: A | Discharge status: Home / Self Care | Payer: Medicare Other | Source: Ambulatory Visit | Attending: Family Medicine | Admitting: Family Medicine

## 2018-09-04 DIAGNOSIS — Z1231 Encounter for screening mammogram for malignant neoplasm of breast: Secondary | ICD-10-CM | POA: Insufficient documentation

## 2018-09-10 ENCOUNTER — Other Ambulatory Visit: Payer: Self-pay | Admitting: Family Medicine

## 2018-09-11 DIAGNOSIS — H401131 Primary open-angle glaucoma, bilateral, mild stage: Secondary | ICD-10-CM | POA: Diagnosis not present

## 2018-09-18 ENCOUNTER — Ambulatory Visit (INDEPENDENT_AMBULATORY_CARE_PROVIDER_SITE_OTHER): Payer: Medicare Other | Admitting: Family Medicine

## 2018-09-18 ENCOUNTER — Encounter: Payer: Self-pay | Admitting: Family Medicine

## 2018-09-18 VITALS — BP 127/69 | HR 50 | Temp 97.9°F | Resp 16 | Ht 66.0 in | Wt 182.2 lb

## 2018-09-18 DIAGNOSIS — G4700 Insomnia, unspecified: Secondary | ICD-10-CM | POA: Diagnosis not present

## 2018-09-18 DIAGNOSIS — R5383 Other fatigue: Secondary | ICD-10-CM | POA: Diagnosis not present

## 2018-09-18 DIAGNOSIS — E7849 Other hyperlipidemia: Secondary | ICD-10-CM

## 2018-09-18 NOTE — Progress Notes (Addendum)
Patient: Mary [name] Female    DOB: 08-Oct-1951   67 y.o.   MRN: [id number] Visit Date: 09/18/2018  Today's Provider: Wilhemena Durie, MD   Chief Complaint  Patient presents with  . Follow-up   Subjective:    HPI Patient presents today for a follow up to AWE done on 08/29/2018. Patient reports feeling well. She is exercising lightly. She states she is sleeping fairly well. Patient states she has episodes of chest tightness. She is wondering if this may be anxiety or panic attacks.  She has a lot of stress as her husband of 22 years has progressive dementia and this takes up most of her time.    Allergies  Allergen Reactions  . Amoxicillin Rash  . Hydrocodone-Acetaminophen Rash     Current Outpatient Medications:  .  temazepam (RESTORIL) 30 MG capsule, TAKE 1 CAPSULE BY MOUTH AT BEDTIME AS NEEDED FOR SLEEP, Disp: 30 capsule, Rfl: 4  Review of Systems  Constitutional: Negative.   HENT: Negative.   Eyes: Negative.   Respiratory: Positive for chest tightness (episodes).   Cardiovascular: Negative.   Gastrointestinal: Negative.   Endocrine: Negative.   Genitourinary: Negative.   Musculoskeletal: Negative.   Skin: Negative.   Allergic/Immunologic: Negative.   Neurological: Negative.   Hematological: Negative.   Psychiatric/Behavioral: Negative.     Social History   Tobacco Use  . Smoking status: Never Smoker  . Smokeless tobacco: Never Used  Substance Use Topics  . Alcohol use: Yes    Alcohol/week: 7.0 standard drinks    Types: 7 Glasses of wine per week    Comment: 1 drink a day   Objective:   BP 127/69 (BP Location: Right Arm, Patient Position: Sitting, Cuff Size: Normal)   Pulse (!) 50   Temp 97.9 F (36.6 C) (Oral)   Resp 16   Ht 5\' 6"  (1.676 m)   Wt 182 lb 3.2 oz (82.6 kg)   SpO2 96%   BMI 29.41 kg/m  Vitals:   09/18/18 1416  BP: 127/69  Pulse: (!) 50  Resp: 16  Temp: 97.9 F (36.6 C)  TempSrc: Oral  SpO2: 96%  Weight: 182 lb 3.2 oz  (82.6 kg)  Height: 5\' 6"  (1.676 m)     Physical Exam Constitutional:      Appearance: She is well-developed and well-nourished.  HENT:     Head: Normocephalic and atraumatic.     Right Ear: External ear normal.     Left Ear: External ear normal.     Nose: Nose normal.     Mouth/Throat:     Mouth: Oropharynx is clear and moist.  Eyes:     Extraocular Movements: EOM normal.     Conjunctiva/sclera: Conjunctivae normal.     Pupils: Pupils are equal, round, and reactive to light.  Neck:     Musculoskeletal: Normal range of motion and neck supple.  Cardiovascular:     Rate and Rhythm: Normal rate and regular rhythm.     Pulses: Intact distal pulses.     Heart sounds: Normal heart sounds.  Pulmonary:     Effort: Pulmonary effort is normal.     Breath sounds: Normal breath sounds.  Chest:     Breasts: No discharge from either breast. No breast swelling, tenderness and bleeding. Breasts are symmetrical.        Right: No inverted nipple, mass, nipple discharge, skin change or tenderness.        Left: No inverted  nipple, mass, nipple discharge, skin change or tenderness.  Abdominal:     General: Bowel sounds are normal.     Palpations: Abdomen is soft.  Musculoskeletal:        General: No tenderness or deformity.  Skin:    General: Skin is warm and dry.  Neurological:     General: No focal deficit present.     Mental Status: She is alert and oriented to person, place, and time.     Motor: No abnormal muscle tone.     Coordination: Coordination normal.     Deep Tendon Reflexes: Reflexes normal.  Psychiatric:        Mood and Affect: Mood and affect and mood normal.        Behavior: Behavior normal.        Thought Content: Thought content normal.        Judgment: Judgment normal.         Assessment & Plan:     1. Other hyperlipidemia  - Lipid panel  2. Insomnia, persistent Adjustment reaction due to being the primary caregiver for her husband with dementia. About  possibly getting counseling.   CCM referral More than 50% of greater than 25-minute visit spent in counseling and coordination of care 3. Other fatigue  - CBC with Differential/Platelet - Comprehensive metabolic panel - TSH - Ambulatory referral to Chronic Care Management Services      I have done the exam and reviewed the above chart and it is accurate to the best of my knowledge. Development worker, community has been used in this note in any air is in the dictation or transcription are unintentional.  Wilhemena Durie, MD  [place]

## 2018-09-19 DIAGNOSIS — E7849 Other hyperlipidemia: Secondary | ICD-10-CM | POA: Diagnosis not present

## 2018-09-19 DIAGNOSIS — R5383 Other fatigue: Secondary | ICD-10-CM | POA: Diagnosis not present

## 2018-09-20 LAB — CBC WITH DIFFERENTIAL/PLATELET
BASOS ABS: 0.1 10*3/uL (ref 0.0–0.2)
BASOS: 1 %
EOS (ABSOLUTE): 0.1 10*3/uL (ref 0.0–0.4)
EOS: 2 %
HEMATOCRIT: 40.8 % (ref 34.0–46.6)
HEMOGLOBIN: 13.8 g/dL (ref 11.1–15.9)
Immature Grans (Abs): 0 10*3/uL (ref 0.0–0.1)
Immature Granulocytes: 0 %
LYMPHS: 36 %
Lymphocytes Absolute: 1.7 10*3/uL (ref 0.7–3.1)
MCH: 30.5 pg (ref 26.6–33.0)
MCHC: 33.8 g/dL (ref 31.5–35.7)
MCV: 90 fL (ref 79–97)
MONOCYTES: 8 %
MONOS ABS: 0.4 10*3/uL (ref 0.1–0.9)
NEUTROS ABS: 2.4 10*3/uL (ref 1.4–7.0)
Neutrophils: 53 %
Platelets: 270 10*3/uL (ref 150–450)
RBC: 4.52 x10E6/uL (ref 3.77–5.28)
RDW: 13 % (ref 12.3–15.4)
WBC: 4.7 10*3/uL (ref 3.4–10.8)

## 2018-09-20 LAB — LIPID PANEL
CHOL/HDL RATIO: 3.5 ratio (ref 0.0–4.4)
Cholesterol, Total: 181 mg/dL (ref 100–199)
HDL: 51 mg/dL (ref >39)
LDL CALC: 108 mg/dL — AB (ref 0–99)
Triglycerides: 108 mg/dL (ref 0–149)
VLDL Cholesterol Cal: 22 mg/dL (ref 5–40)

## 2018-09-20 LAB — COMPREHENSIVE METABOLIC PANEL
ALT: 35 IU/L — ABNORMAL HIGH (ref 0–32)
AST: 27 IU/L (ref 0–40)
Albumin/Globulin Ratio: 1.8 (ref 1.2–2.2)
Albumin: 4.2 g/dL (ref 3.6–4.8)
Alkaline Phosphatase: 59 IU/L (ref 39–117)
BILIRUBIN TOTAL: 0.4 mg/dL (ref 0.0–1.2)
BUN / CREAT RATIO: 15 (ref 12–28)
BUN: 13 mg/dL (ref 8–27)
CALCIUM: 9.4 mg/dL (ref 8.7–10.3)
CHLORIDE: 106 mmol/L (ref 96–106)
CO2: 23 mmol/L (ref 20–29)
Creatinine, Ser: 0.85 mg/dL (ref 0.57–1.00)
GFR calc Af Amer: 82 mL/min/{1.73_m2} (ref >59)
GFR, EST NON AFRICAN AMERICAN: 71 mL/min/{1.73_m2} (ref >59)
Globulin, Total: 2.3 g/dL (ref 1.5–4.5)
Glucose: 95 mg/dL (ref 65–99)
Potassium: 5.1 mmol/L (ref 3.5–5.2)
Sodium: 143 mmol/L (ref 134–144)
TOTAL PROTEIN: 6.5 g/dL (ref 6.0–8.5)

## 2018-09-20 LAB — TSH: TSH: 1.76 u[IU]/mL (ref 0.450–4.500)

## 2018-09-21 ENCOUNTER — Ambulatory Visit: Payer: Self-pay

## 2018-09-21 ENCOUNTER — Telehealth: Payer: Self-pay

## 2018-09-21 DIAGNOSIS — G4700 Insomnia, unspecified: Secondary | ICD-10-CM

## 2018-09-21 DIAGNOSIS — E7849 Other hyperlipidemia: Secondary | ICD-10-CM

## 2018-09-21 NOTE — Telephone Encounter (Signed)
-----   Message from Jerrol Banana., MD sent at 09/21/2018  8:50 AM EST ----- Labs okay

## 2018-09-21 NOTE — Chronic Care Management (AMB) (Signed)
Mary [name] is a 67 year old female who sees Dr. Miguel Aschoff for primary care. Dr. Rosanna Randy asked the CCM team to consult the patient for assistance with caregiver strain and resources as patient would like to explore and plan for level of care change/options for her husband. Referral was placed 09/18/18. Telephone outreach to patient today to introduce CCM services.   Mary [name] agreed that CCM services would be helpful to her. She is in no immediate hurry to explore resources as mentioned above and would like to meet with the CCM Team in January.   Mary [name] was given information about Chronic Care Management services today including:  1. CCM service includes personalized support from designated clinical staff supervised by her physician, including individualized plan of care and coordination with other care providers 2. 24/7 contact phone numbers for assistance for urgent and routine care needs. 3. Service will only be billed when office clinical staff spend 20 minutes or more in a month to coordinate care. 4. Only one practitioner may furnish and bill the service in a calendar month. 5. The patient may stop CCM services at any time (effective at the end of the month) by phone call to the office staff. 6. The patient will be responsible for cost sharing (co-pay) of up to 20% of the service fee (after annual deductible is met).  Patient agreed to services and verbal consent obtained.  Plan: Will follow up with Mary [name] for appointment scheduling in 3 weeks  Mary Loredo E. Rollene Rotunda, RN, BSN Nurse Care Coordinator [facility] Care [facility] [phone]

## 2018-09-21 NOTE — Telephone Encounter (Signed)
Pt advised.   Thanks,   -Nelani Schmelzle  

## 2018-10-08 ENCOUNTER — Telehealth: Payer: Self-pay

## 2018-10-08 ENCOUNTER — Ambulatory Visit: Payer: Self-pay

## 2018-10-08 DIAGNOSIS — E7849 Other hyperlipidemia: Secondary | ICD-10-CM

## 2018-10-08 DIAGNOSIS — G4700 Insomnia, unspecified: Secondary | ICD-10-CM

## 2018-10-08 DIAGNOSIS — R5383 Other fatigue: Secondary | ICD-10-CM

## 2018-10-08 NOTE — Chronic Care Management (AMB) (Signed)
  Chronic Care Management Note    Mary [name] is a 67year old female who sees Dr. Natale Milch primary care. Dr. Rosanna Randy asked the CCM team to consult the patient for assistance with caregiver strain and resources as patient would like to explore and plan for level of care change/options for her husband. Referral was placed12/10/19. Telephone outreach today to schedule initial face to face visit with the CCM Team to discuss custodial care options for Mary [name]'s husband. Mary [name] was originally contacted 12/13 however she requested to wait until January 2020 to meet with the CCM Team.  Was unable to reach patient via telephone. I have left HIPAA compliant voicemail asking patient to return my call. (unsuccessful outreach #1).  Plan: Will follow-up within 3-5  business days via telephone.   Manna Gose E. Rollene Rotunda, RN, BSN Nurse Care Coordinator [facility] [phone]

## 2018-10-17 ENCOUNTER — Ambulatory Visit: Payer: Self-pay

## 2018-10-17 ENCOUNTER — Telehealth: Payer: Self-pay

## 2018-10-17 DIAGNOSIS — R5383 Other fatigue: Secondary | ICD-10-CM

## 2018-10-17 DIAGNOSIS — E7849 Other hyperlipidemia: Secondary | ICD-10-CM

## 2018-10-17 DIAGNOSIS — G4700 Insomnia, unspecified: Secondary | ICD-10-CM

## 2018-10-17 NOTE — Chronic Care Management (AMB) (Signed)
  Chronic Care Management   Note  10/17/2018 Name: Mary [name] MRN: [id number] DOB: November 07, 1950  [name] a 68year old femalewho sees Dr. Natale Milch primary care.Dr. Orma Flaming the CCM team to consult the patient for assistancewith caregiver strain and resources as patient would like to explore and plan for level of care change/options for her husband. Referral was placed12/10/19. Telephone outreach today to schedule initial face to face visit with the CCM Team to discuss custodial care options for [name]'s husband. [name] was originally contacted 12/13 however she requested to wait until January 2020 to meet with the CCM Team.  Was unable to reach patient via telephone. I have left HIPAA compliant voicemail asking patient to return my call. (unsuccessful outreach #2).  Plan: Will follow-up within 3-5  business days via telephone.   Iktan Aikman E. Rollene Rotunda, RN, BSN Nurse Care Coordinator [facility] [phone]

## 2018-10-17 NOTE — Chronic Care Management (AMB) (Signed)
  Chronic Care Management   Note  10/17/2018 Name: DAWNMARIE BREON MRN: [id number] DOB: 1951-09-21   Care Coordination: CCM RN CM received incoming call from Ms. Anihya Belay. Ms Stroud was referred to the CCM team to consult the patient for assistancewith caregiver strain and resources as patient would like to explore and plan for level of care change/options for her husband. Ms. Reinertsen was originally contacted 12/13 however she requested to wait until January 2020 to meet with the CCM Team.  Today Ms. Scharrer states she is not ready to engage with the CCM Team to discuss care options for her husband. She states they are doing OK right now, but is thankful that help will be available when the time comes to make decisions about his care. Ms. Ansley is provided with CCM Contact information and encouraged to utilize when she is ready. Dr. Rosanna Randy will be notified of patients decision to not engage with the CCM Team at this time.  Yosgart Pavey E. Rollene Rotunda, RN, BSN Nurse Care Coordinator [facility] [phone]

## 2018-10-18 DIAGNOSIS — H401131 Primary open-angle glaucoma, bilateral, mild stage: Secondary | ICD-10-CM | POA: Diagnosis not present

## 2018-10-24 ENCOUNTER — Telehealth: Payer: Self-pay

## 2018-11-15 DIAGNOSIS — H401131 Primary open-angle glaucoma, bilateral, mild stage: Secondary | ICD-10-CM | POA: Diagnosis not present

## 2018-11-19 ENCOUNTER — Ambulatory Visit: Payer: Medicare Other | Admitting: Anesthesiology

## 2018-11-19 ENCOUNTER — Ambulatory Visit
Admission: RE | Admit: 2018-11-19 | Discharge: 2018-11-19 | Disposition: A | Discharge status: Home / Self Care | Payer: Medicare Other | Attending: Unknown Physician Specialty | Admitting: Unknown Physician Specialty

## 2018-11-19 ENCOUNTER — Encounter
Admission: RE | Disposition: A | Discharge status: Home / Self Care | Payer: Self-pay | Source: Home / Self Care | Attending: Unknown Physician Specialty

## 2018-11-19 DIAGNOSIS — D124 Benign neoplasm of descending colon: Secondary | ICD-10-CM | POA: Diagnosis not present

## 2018-11-19 DIAGNOSIS — E785 Hyperlipidemia, unspecified: Secondary | ICD-10-CM | POA: Insufficient documentation

## 2018-11-19 DIAGNOSIS — Z86010 Personal history of colonic polyps: Secondary | ICD-10-CM | POA: Insufficient documentation

## 2018-11-19 DIAGNOSIS — Z1211 Encounter for screening for malignant neoplasm of colon: Secondary | ICD-10-CM | POA: Diagnosis not present

## 2018-11-19 DIAGNOSIS — K635 Polyp of colon: Secondary | ICD-10-CM | POA: Diagnosis not present

## 2018-11-19 DIAGNOSIS — Z8371 Family history of colonic polyps: Secondary | ICD-10-CM | POA: Diagnosis not present

## 2018-11-19 DIAGNOSIS — D125 Benign neoplasm of sigmoid colon: Secondary | ICD-10-CM | POA: Diagnosis not present

## 2018-11-19 DIAGNOSIS — D123 Benign neoplasm of transverse colon: Secondary | ICD-10-CM | POA: Insufficient documentation

## 2018-11-19 HISTORY — PX: COLONOSCOPY WITH PROPOFOL: SHX5780

## 2018-11-19 SURGERY — COLONOSCOPY WITH PROPOFOL
Anesthesia: General

## 2018-11-19 MED ORDER — SODIUM CHLORIDE 0.9 % IV SOLN: INTRAVENOUS | Status: DC

## 2018-11-19 MED ORDER — MIDAZOLAM HCL 5 MG/5ML IJ SOLN
INTRAMUSCULAR | Status: DC | PRN
  Administered 2018-11-19: 2 mg via INTRAVENOUS

## 2018-11-19 MED ORDER — MIDAZOLAM HCL 2 MG/2ML IJ SOLN
INTRAMUSCULAR | Status: AC
  Filled 2018-11-19: qty 2

## 2018-11-19 MED ORDER — FENTANYL CITRATE (PF) 100 MCG/2ML IJ SOLN
INTRAMUSCULAR | Status: DC | PRN
  Administered 2018-11-19 (×2): 50 ug via INTRAVENOUS

## 2018-11-19 MED ORDER — FENTANYL CITRATE (PF) 100 MCG/2ML IJ SOLN
INTRAMUSCULAR | Status: AC
  Filled 2018-11-19: qty 2

## 2018-11-19 MED ORDER — LIDOCAINE HCL (PF) 2 % IJ SOLN
INTRAMUSCULAR | Status: DC | PRN
  Administered 2018-11-19: 60 mg

## 2018-11-19 MED ORDER — PROPOFOL 500 MG/50ML IV EMUL
INTRAVENOUS | Status: DC | PRN
  Administered 2018-11-19: 25 ug/kg/min via INTRAVENOUS

## 2018-11-19 MED ORDER — EPHEDRINE SULFATE 50 MG/ML IJ SOLN
INTRAMUSCULAR | Status: DC | PRN
  Administered 2018-11-19 (×2): 10 mg via INTRAVENOUS

## 2018-11-19 MED ORDER — LIDOCAINE HCL (PF) 2 % IJ SOLN
INTRAMUSCULAR | Status: AC
  Filled 2018-11-19: qty 10

## 2018-11-19 MED ORDER — SODIUM CHLORIDE (PF) 0.9 % IJ SOLN
INTRAMUSCULAR | Status: AC
  Filled 2018-11-19: qty 10

## 2018-11-19 MED ORDER — SODIUM CHLORIDE 0.9 % IV SOLN
INTRAVENOUS | Status: DC
  Administered 2018-11-19: 1000 mL via INTRAVENOUS

## 2018-11-19 MED ORDER — PROPOFOL 10 MG/ML IV BOLUS
INTRAVENOUS | Status: DC | PRN
  Administered 2018-11-19: 20 mg via INTRAVENOUS
  Administered 2018-11-19: 10 mg via INTRAVENOUS
  Administered 2018-11-19 (×2): 20 mg via INTRAVENOUS

## 2018-11-19 NOTE — Transfer of Care (Signed)
Immediate Anesthesia Transfer of Care Note  Patient: Mary [name]  Procedure(s) Performed: COLONOSCOPY WITH PROPOFOL (N/A )  Patient Location: PACU  Anesthesia Type:General  Level of Consciousness: sedated  Airway & Oxygen Therapy: Patient Spontanous Breathing and Patient connected to nasal cannula oxygen  Post-op Assessment: Report given to RN and Post -op Vital signs reviewed and stable  Post vital signs: Reviewed and stable  Last Vitals:  Vitals Value Taken Time  BP 105/56 11/19/2018  1:14 PM  Temp 36.3 C 11/19/2018  1:14 PM  Pulse 83 11/19/2018  1:14 PM  Resp 15 11/19/2018  1:14 PM  SpO2 97 % 11/19/2018  1:14 PM    Last Pain:  Vitals:   11/19/18 1314  TempSrc: Tympanic  PainSc: 0-No pain         Complications: No apparent anesthesia complications

## 2018-11-19 NOTE — Anesthesia Preprocedure Evaluation (Signed)
Anesthesia Evaluation  Patient identified by MRN, date of birth, ID band Patient awake    Reviewed: Allergy & Precautions, NPO status , Patient's Chart, lab work & pertinent test results  History of Anesthesia Complications Negative for: history of anesthetic complications  Airway Mallampati: II  TM Distance: >3 FB Neck ROM: Full    Dental no notable dental hx.    Pulmonary neg pulmonary ROS, neg sleep apnea, neg COPD,    breath sounds clear to auscultation- rhonchi (-) wheezing      Cardiovascular Exercise Tolerance: Good (-) hypertension(-) CAD and (-) Past MI  Rhythm:Regular Rate:Normal - Systolic murmurs and - Diastolic murmurs    Neuro/Psych negative neurological ROS  negative psych ROS   GI/Hepatic negative GI ROS, Neg liver ROS,   Endo/Other  negative endocrine ROSneg diabetes  Renal/GU negative Renal ROS     Musculoskeletal negative musculoskeletal ROS (+)   Abdominal (+) - obese,   Peds  Hematology negative hematology ROS (+)   Anesthesia Other Findings Past Medical History: No date: Hyperlipidemia No date: UTI (lower urinary tract infection)   Reproductive/Obstetrics                             Anesthesia Physical Anesthesia Plan  ASA: II  Anesthesia Plan: General   Post-op Pain Management:    Induction: Intravenous  PONV Risk Score and Plan: 2 and Propofol infusion  Airway Management Planned: Natural Airway  Additional Equipment:   Intra-op Plan:   Post-operative Plan:   Informed Consent: I have reviewed the patients History and Physical, chart, labs and discussed the procedure including the risks, benefits and alternatives for the proposed anesthesia with the patient or authorized representative who has indicated his/her understanding and acceptance.     Dental advisory given  Plan Discussed with: CRNA and Anesthesiologist  Anesthesia Plan Comments:          Anesthesia Quick Evaluation

## 2018-11-19 NOTE — Anesthesia Post-op Follow-up Note (Signed)
Anesthesia QCDR form completed.        

## 2018-11-19 NOTE — Anesthesia Postprocedure Evaluation (Signed)
Anesthesia Post Note  Patient: Mary [name]  Procedure(s) Performed: COLONOSCOPY WITH PROPOFOL (N/A )  Patient location during evaluation: Endoscopy Anesthesia Type: General Level of consciousness: awake and alert and oriented Pain management: pain level controlled Vital Signs Assessment: post-procedure vital signs reviewed and stable Respiratory status: spontaneous breathing, nonlabored ventilation and respiratory function stable Cardiovascular status: blood pressure returned to baseline and stable Postop Assessment: no signs of nausea or vomiting Anesthetic complications: no     Last Vitals:  Vitals:   11/19/18 1334 11/19/18 1344  BP: 113/88 (!) 112/58  Pulse: (!) 59 (!) 59  Resp: 12 13  Temp:    SpO2: 99% 100%    Last Pain:  Vitals:   11/19/18 1344  TempSrc:   PainSc: 0-No pain                 Delon Revelo

## 2018-11-19 NOTE — Op Note (Signed)
[facility] Gastroenterology Patient Name: Mary [name] Procedure Date: 11/19/2018 12:31 PM MRN: [id number] Account #: [id number] Date of Birth: 17-Jul-1951 Admit Type: Outpatient Age: 68 Room: [facility] ENDO ROOM 1 Gender: Female Note Status: Finalized Procedure:            Colonoscopy Indications:          High risk colon cancer surveillance: Personal history                        of colonic polyps Providers:            Manya Silvas, MD Referring MD:         Janine Ores. Rosanna Randy, MD (Referring MD) Medicines:            Propofol per Anesthesia Complications:        No immediate complications. Procedure:            Pre-Anesthesia Assessment:                       - After reviewing the risks and benefits, the patient                        was deemed in satisfactory condition to undergo the                        procedure.                       After obtaining informed consent, the colonoscope was                        passed under direct vision. Throughout the procedure,                        the patient's blood pressure, pulse, and oxygen                        saturations were monitored continuously. The                        Colonoscope was introduced through the anus and                        advanced to the the cecum, identified by appendiceal                        orifice and ileocecal valve. The colonoscopy was                        performed without difficulty. The patient tolerated the                        procedure well. The quality of the bowel preparation                        was good. Findings:      Two sessile polyps were found in the transverse colon. The polyps were       diminutive in size. These polyps were removed with a jumbo cold forceps.       Resection and retrieval were complete.      Two sessile  polyps were found in the descending colon. The polyps were       diminutive in size. These polyps were removed with a jumbo cold forceps.        Resection and retrieval were complete.      A diminutive polyp was found in the sigmoid colon. The polyp was       sessile. The polyp was removed with a jumbo cold forceps. Resection and       retrieval were complete.      The exam was otherwise without abnormality. Impression:           - Two diminutive polyps in the transverse colon,                        removed with a jumbo cold forceps. Resected and                        retrieved.                       - Two diminutive polyps in the descending colon,                        removed with a jumbo cold forceps. Resected and                        retrieved.                       - One diminutive polyp in the sigmoid colon, removed                        with a jumbo cold forceps. Resected and retrieved.                       - The examination was otherwise normal. Recommendation:       - Await pathology results. Manya Silvas, MD 11/19/2018 1:15:27 PM This report has been signed electronically. Number of Addenda: 0 Note Initiated On: 11/19/2018 12:31 PM Scope Withdrawal Time: 0 hours 21 minutes 41 seconds  Total Procedure Duration: 0 hours 30 minutes 16 seconds       [facility]

## 2018-11-19 NOTE — H&P (Signed)
Primary Care Physician:  Jerrol Banana., MD Primary Gastroenterologist:  Dr. Vira Agar  Pre-Procedure History & Physical: HPI:  Mary [name] is a 68 y.o. female is here for an colonoscopy.   Past Medical History:  Diagnosis Date  . Hyperlipidemia   . UTI (lower urinary tract infection)     Past Surgical History:  Procedure Laterality Date  . TONSILLECTOMY    . TUBAL LIGATION      Prior to Admission medications   Medication Sig Start Date End Date Taking? Authorizing Provider  temazepam (RESTORIL) 30 MG capsule TAKE 1 CAPSULE BY MOUTH AT BEDTIME AS NEEDED FOR SLEEP 09/10/18   Jerrol Banana., MD    Allergies as of 09/21/2018 - Review Complete 09/18/2018  Allergen Reaction Noted  . Amoxicillin Rash 07/14/2015  . Hydrocodone-acetaminophen Rash 07/14/2015    Family History  Problem Relation Age of Onset  . Heart disease Mother   . Diabetes Mother   . Alzheimer's disease Mother   . Heart attack Father   . Heart disease Father   . Skin cancer Father   . COPD Father   . Hypertension Brother   . Heart disease Brother   . Parkinson's disease Maternal Grandmother   . Breast cancer Neg Hx     Social History   Socioeconomic History  . Marital status: Married    Spouse name: Not on file  . Number of children: 2  . Years of education: Not on file  . Highest education level: Associate degree: occupational, Hotel manager, or vocational program  Occupational History  . Occupation: retired  Scientific laboratory technician  . Financial resource strain: Not hard at all  . Food insecurity:    Worry: Never true    Inability: Never true  . Transportation needs:    Medical: No    Non-medical: No  Tobacco Use  . Smoking status: Never Smoker  . Smokeless tobacco: Never Used  Substance and Sexual Activity  . Alcohol use: Yes    Alcohol/week: 7.0 standard drinks    Types: 7 Glasses of wine per week    Comment: 1 drink a day  . Drug use: No  . Sexual activity: Not on file   Lifestyle  . Physical activity:    Days per week: 2 days    Minutes per session: 60 min  . Stress: To some extent  Relationships  . Social connections:    Talks on phone: Patient refused    Gets together: Patient refused    Attends religious service: Patient refused    Active member of club or organization: Patient refused    Attends meetings of clubs or organizations: Patient refused    Relationship status: Patient refused  . Intimate partner violence:    Fear of current or ex partner: Patient refused    Emotionally abused: Patient refused    Physically abused: Patient refused    Forced sexual activity: Patient refused  Other Topics Concern  . Not on file  Social History Narrative  . Not on file    Review of Systems: See HPI, otherwise negative ROS  Physical Exam: BP (!) 124/91   Pulse (!) 51   Temp (!) 96 F (35.6 C) (Tympanic)   Resp 16   Ht 5\' 6"  (1.676 m)   Wt 81.6 kg   SpO2 100%   BMI 29.05 kg/m  General:   Alert,  pleasant and cooperative in NAD Head:  Normocephalic and atraumatic. Neck:  Supple; no masses or  thyromegaly. Lungs:  Clear throughout to auscultation.    Heart:  Regular rate and rhythm. Abdomen:  Soft, nontender and nondistended. Normal bowel sounds, without guarding, and without rebound.   Neurologic:  Alert and  oriented x4;  grossly normal neurologically.  Impression/Plan: Mary [name] is here for an colonoscopy to be performed for colon cancer screening.  Risks, benefits, limitations, and alternatives regarding  colonoscopy have been reviewed with the patient.  Questions have been answered.  All parties agreeable.   Gaylyn Cheers, MD  11/19/2018, 12:31 PM

## 2018-11-20 LAB — SURGICAL PATHOLOGY

## 2018-11-21 ENCOUNTER — Encounter: Payer: Self-pay | Admitting: Unknown Physician Specialty

## 2018-12-19 DIAGNOSIS — H401131 Primary open-angle glaucoma, bilateral, mild stage: Secondary | ICD-10-CM | POA: Diagnosis not present

## 2019-02-19 DIAGNOSIS — H401131 Primary open-angle glaucoma, bilateral, mild stage: Secondary | ICD-10-CM | POA: Diagnosis not present

## 2019-03-13 DIAGNOSIS — H401131 Primary open-angle glaucoma, bilateral, mild stage: Secondary | ICD-10-CM | POA: Diagnosis not present

## 2019-03-27 ENCOUNTER — Ambulatory Visit: Payer: Self-pay | Admitting: Family Medicine

## 2019-04-03 ENCOUNTER — Encounter: Payer: Self-pay | Admitting: Family Medicine

## 2019-04-03 ENCOUNTER — Ambulatory Visit (INDEPENDENT_AMBULATORY_CARE_PROVIDER_SITE_OTHER): Payer: Medicare Other | Admitting: Family Medicine

## 2019-04-03 ENCOUNTER — Other Ambulatory Visit: Payer: Self-pay

## 2019-04-03 VITALS — BP 116/64 | HR 62 | Temp 98.4°F | Wt 183.4 lb

## 2019-04-03 DIAGNOSIS — G4700 Insomnia, unspecified: Secondary | ICD-10-CM | POA: Diagnosis not present

## 2019-04-03 DIAGNOSIS — E7849 Other hyperlipidemia: Secondary | ICD-10-CM | POA: Diagnosis not present

## 2019-04-03 NOTE — Progress Notes (Signed)
Patient: Mary [name] Female    DOB: Nov 01, 1950   68 y.o.   MRN: [id number] Visit Date: 04/03/2019  Today's Provider: Wilhemena Durie, MD   Chief Complaint  Patient presents with  . Hyperlipidemia  . Insomnia   Subjective:    HPI  Lipid/Cholesterol, Follow-up:   Last seen for this6 months ago.  Management changes since that visit include no changes. . Last Lipid Panel:    Component Value Date/Time   CHOL 181 09/19/2018 0912   TRIG 108 09/19/2018 0912   HDL 51 09/19/2018 0912   CHOLHDL 3.5 09/19/2018 0912   CHOLHDL 3.6 08/13/2015 1150   VLDL 28 08/13/2015 1150   LDLCALC 108 (H) 09/19/2018 0912    Risk factors for vascular disease include hypercholesterolemia  She reports good compliance with treatment. She is not having side effects.  Current symptoms include none and have been stable. Weight trend: stable Prior visit with dietician: no Current diet: well balanced Current exercise: none  Wt Readings from Last 3 Encounters:  04/03/19 183 lb 6.4 oz (83.2 kg)  11/19/18 180 lb (81.6 kg)  09/18/18 182 lb 3.2 oz (82.6 kg)    -------------------------------------------------------------------  Insomnia Patient presents today for insomnia 6 month follow-up. Patient states she may take her Temazepam once or twice a week. Patient states she is still having a little sleep disturbance.   Allergies  Allergen Reactions  . Hydrocodone-Acetaminophen   . Amoxicillin Rash  . Hydrocodone-Acetaminophen Rash     Current Outpatient Medications:  .  temazepam (RESTORIL) 30 MG capsule, TAKE 1 CAPSULE BY MOUTH AT BEDTIME AS NEEDED FOR SLEEP, Disp: 30 capsule, Rfl: 4  Review of Systems  Constitutional: Negative.   Eyes: Negative.   Respiratory: Negative.   Gastrointestinal: Negative.   Endocrine: Negative.   Genitourinary: Negative.   Allergic/Immunologic: Negative.   Neurological: Negative.   Hematological: Negative.   Psychiatric/Behavioral: Positive for  sleep disturbance (little per pt).    Social History   Tobacco Use  . Smoking status: Never Smoker  . Smokeless tobacco: Never Used  Substance Use Topics  . Alcohol use: Yes    Alcohol/week: 7.0 standard drinks    Types: 7 Glasses of wine per week    Comment: 1 drink a day      Objective:   BP 116/64 (BP Location: Left Arm, Patient Position: Sitting, Cuff Size: Normal)   Pulse 62   Temp 98.4 F (36.9 C) (Oral)   Wt 183 lb 6.4 oz (83.2 kg)   SpO2 96%   BMI 29.60 kg/m  Vitals:   04/03/19 1549  BP: 116/64  Pulse: 62  Temp: 98.4 F (36.9 C)  TempSrc: Oral  SpO2: 96%  Weight: 183 lb 6.4 oz (83.2 kg)     Physical Exam Vitals signs reviewed.  Constitutional:      Appearance: Normal appearance.  HENT:     Head: Normocephalic and atraumatic.     Right Ear: External ear normal.     Left Ear: External ear normal.  Eyes:     General: No scleral icterus.    Conjunctiva/sclera: Conjunctivae normal.  Cardiovascular:     Rate and Rhythm: Normal rate and regular rhythm.     Pulses: Normal pulses.     Heart sounds: Normal heart sounds.  Pulmonary:     Breath sounds: Normal breath sounds.  Abdominal:     Palpations: Abdomen is soft.  Musculoskeletal:     Right lower leg: No edema.  Left lower leg: No edema.  Skin:    General: Skin is warm and dry.  Neurological:     General: No focal deficit present.     Mental Status: She is alert and oriented to person, place, and time. Mental status is at baseline.  Psychiatric:        Mood and Affect: Mood normal.        Behavior: Behavior normal.        Thought Content: Thought content normal.        Judgment: Judgment normal.      No results found for any visits on 04/03/19.     Assessment & Plan    1. Insomnia, persistent Pt dealing with ongoing stress of dealing with husband with  Progressive depression without behavioral disturbance.  More than 50% of 15 minute visit spent in counseling or coordination of care   2. Other hyperlipidemia Las lipids good.    I have done the exam and reviewed the above chart and it is accurate to the best of my knowledge. Development worker, community has been used in this note in any air is in the dictation or transcription are unintentional.  Wilhemena Durie, MD  [place]

## 2019-07-09 ENCOUNTER — Other Ambulatory Visit: Payer: Self-pay

## 2019-07-09 ENCOUNTER — Ambulatory Visit (INDEPENDENT_AMBULATORY_CARE_PROVIDER_SITE_OTHER): Payer: Medicare Other | Admitting: Family Medicine

## 2019-07-09 DIAGNOSIS — Z23 Encounter for immunization: Secondary | ICD-10-CM | POA: Diagnosis not present

## 2019-09-02 ENCOUNTER — Ambulatory Visit: Payer: Self-pay | Admitting: Family Medicine

## 2019-09-02 ENCOUNTER — Ambulatory Visit: Payer: Medicare Other

## 2019-09-09 DIAGNOSIS — H401131 Primary open-angle glaucoma, bilateral, mild stage: Secondary | ICD-10-CM | POA: Diagnosis not present

## 2019-09-13 NOTE — Progress Notes (Signed)
Patient: Mary [name] Female    DOB: 02/09/1951   68 y.o.   MRN: [id number] Visit Date: 09/17/2019  Today's Provider: Wilhemena Durie, MD   Chief Complaint  Patient presents with  . Annual Exam   Subjective:    Patient had AWV with [facility] today at 9:20am by telephone.  HPI  Pt overall feeling well ,dealing with husband at home with progressive dementia. Insomnia, persistent From 04/03/2019-no changes.  Other hyperlipidemia From 08/21/2018- lipids good.  Allergies  Allergen Reactions  . Hydrocodone-Acetaminophen   . Amoxicillin Rash  . Hydrocodone-Acetaminophen Rash     Current Outpatient Medications:  .  diphenhydrAMINE HCl (SLEEP AID) 50 MG/30ML LIQD, Take 15 mLs by mouth daily as needed., Disp: , Rfl:  .  Latanoprost (XELPROS) 0.005 % EMUL, Apply 1 drop to eye daily. In both eyes, Disp: , Rfl:  .  temazepam (RESTORIL) 30 MG capsule, TAKE 1 CAPSULE BY MOUTH AT BEDTIME AS NEEDED FOR SLEEP, Disp: 30 capsule, Rfl: 4  Review of Systems  Constitutional: Negative.   HENT: Negative.   Eyes: Negative.   Respiratory: Negative.   Cardiovascular: Negative.   Gastrointestinal: Negative.   Endocrine: Negative.   Genitourinary: Negative.   Musculoskeletal: Negative.   Skin: Negative.   Allergic/Immunologic: Negative.   Neurological: Negative.   Hematological: Negative.   Psychiatric/Behavioral: Negative.     Social History   Tobacco Use  . Smoking status: Never Smoker  . Smokeless tobacco: Never Used  Substance Use Topics  . Alcohol use: Yes    Alcohol/week: 7.0 standard drinks    Types: 7 Glasses of wine per week    Comment: 1 drink a day      Objective:   There were no vitals taken for this visit. There were no vitals filed for this visit.There is no height or weight on file to calculate BMI.   Physical Exam Vitals reviewed.  Constitutional:      Appearance: Normal appearance.  HENT:     Head: Normocephalic and atraumatic.     Right Ear:  External ear normal.     Left Ear: External ear normal.  Eyes:     General: No scleral icterus.    Conjunctiva/sclera: Conjunctivae normal.  Cardiovascular:     Rate and Rhythm: Regular rhythm.     Heart sounds: Normal heart sounds.  Pulmonary:     Breath sounds: Normal breath sounds.  Abdominal:     Palpations: Abdomen is soft.  Skin:    General: Skin is warm and dry.  Neurological:     General: No focal deficit present.     Mental Status: She is alert and oriented to person, place, and time.  Psychiatric:        Mood and Affect: Mood normal.        Behavior: Behavior normal.        Thought Content: Thought content normal.        Judgment: Judgment normal.      No results found for any visits on 09/17/19.     Assessment & Plan    1. Encounter for Medicare annual wellness exam  - TSH - CBC with Diff - Comp Met (CMET)  2. Other hyperlipidemia Check labs - TSH - CBC with Diff - Comp Met (CMET)  3. Insomnia, persistent Sleep hygiene. More than 50% 25 minute visit spent in counseling or coordination of care  - TSH - CBC with Diff - Comp Met (CMET)  4. Osteopenia, unspecified location BMD. - TSH - CBC with Diff - Comp Met (CMET)     Wilhemena Durie, MD  [facility]

## 2019-09-16 NOTE — Progress Notes (Signed)
Subjective:   Mary [name] is a 68 y.o. female who presents for Medicare Annual (Subsequent) preventive examination.    This visit is being conducted through telemedicine due to the COVID-19 pandemic. This patient has given me verbal consent via doximity to conduct this visit, patient states they are participating from their home address. Some vital signs may be absent or patient reported.    Patient identification: identified by name, DOB, and current address  Review of Systems:  N/A  Cardiac Risk Factors include: advanced age (>40men, >38 women);dyslipidemia     Objective:     Vitals: There were no vitals taken for this visit.  There is no height or weight on file to calculate BMI. Unable to obtain vitals due to visit being conducted via telephonically.   Advanced Directives 09/17/2019 11/19/2018 08/29/2018 01/05/2017  Does Patient Have a Medical Advance Directive? Yes Yes Yes No  Type of Paramedic of [place];Living will [place];Living will [place];Living will -  Copy of [place] in Chart? No - copy requested No - copy requested No - copy requested -    Tobacco Social History   Tobacco Use  Smoking Status Never Smoker  Smokeless Tobacco Never Used     Counseling given: Not Answered   Clinical Intake:  Pre-visit preparation completed: Yes  Pain : No/denies pain Pain Score: 0-No pain     Nutritional Risks: None Diabetes: No  How often do you need to have someone help you when you read instructions, pamphlets, or other written materials from your doctor or pharmacy?: 1 - Never  Interpreter Needed?: No  Information entered by :: [facility], LPN  Past Medical History:  Diagnosis Date  . Hyperlipidemia   . UTI (lower urinary tract infection)    Past Surgical History:  Procedure Laterality Date  . COLONOSCOPY WITH PROPOFOL N/A 11/19/2018   Procedure: COLONOSCOPY WITH  PROPOFOL;  Surgeon: Manya Silvas, MD;  Location: [facility] ENDOSCOPY;  Service: Endoscopy;  Laterality: N/A;  . TONSILLECTOMY    . TUBAL LIGATION     Family History  Problem Relation Age of Onset  . Heart disease Mother   . Diabetes Mother   . Alzheimer's disease Mother   . Heart attack Father   . Heart disease Father   . Skin cancer Father   . COPD Father   . Hypertension Brother   . Heart disease Brother   . Parkinson's disease Maternal Grandmother   . Breast cancer Neg Hx    Social History   Socioeconomic History  . Marital status: Married    Spouse name: Not on file  . Number of children: 2  . Years of education: Not on file  . Highest education level: Associate degree: occupational, Hotel manager, or vocational program  Occupational History  . Occupation: retired  Scientific laboratory technician  . Financial resource strain: Not hard at all  . Food insecurity    Worry: Never true    Inability: Never true  . Transportation needs    Medical: No    Non-medical: No  Tobacco Use  . Smoking status: Never Smoker  . Smokeless tobacco: Never Used  Substance and Sexual Activity  . Alcohol use: Yes    Alcohol/week: 7.0 standard drinks    Types: 7 Glasses of wine per week    Comment: 1 drink a day  . Drug use: No  . Sexual activity: Not on file  Lifestyle  . Physical activity  Days per week: 3 days    Minutes per session: 40 min  . Stress: To some extent  Relationships  . Social Herbalist on phone: Patient refused    Gets together: Patient refused    Attends religious service: Patient refused    Active member of club or organization: Patient refused    Attends meetings of clubs or organizations: Patient refused    Relationship status: Patient refused  Other Topics Concern  . Not on file  Social History Narrative  . Not on file    Outpatient Encounter Medications as of 09/17/2019  Medication Sig  . diphenhydrAMINE HCl (SLEEP AID) 50 MG/30ML LIQD Take 15 mLs by mouth  daily as needed.  . Latanoprost (XELPROS) 0.005 % EMUL Apply 1 drop to eye daily. In both eyes  . temazepam (RESTORIL) 30 MG capsule TAKE 1 CAPSULE BY MOUTH AT BEDTIME AS NEEDED FOR SLEEP   No facility-administered encounter medications on file as of 09/17/2019.     Activities of Daily Living In your present state of health, do you have any difficulty performing the following activities: 09/17/2019  Hearing? N  Vision? N  Difficulty concentrating or making decisions? N  Walking or climbing stairs? N  Dressing or bathing? N  Doing errands, shopping? N  Preparing Food and eating ? N  Using the Toilet? N  In the past six months, have you accidently leaked urine? N  Do you have problems with loss of bowel control? N  Managing your Medications? N  Managing your Finances? N  Housekeeping or managing your Housekeeping? N  Some recent data might be hidden    Patient Care Team: Jerrol Banana., MD as PCP - General (Unknown Physician Specialty) Pa, [place] (Optometry) Aubery Lapping, Cori Razor, MD (Dermatology) Cathi Roan, [facility] (Pharmacist)    Assessment:   This is a routine wellness examination for Mary [name].  Exercise Activities and Dietary recommendations Current Exercise Habits: Home exercise routine, Type of exercise: walking, Time (Minutes): 45, Frequency (Times/Week): 3, Weekly Exercise (Minutes/Week): 135, Intensity: Mild, Exercise limited by: None identified  Goals    . Have 3 meals a day     Recommend eating 3 small meals a day with 2 healthy snacks in between. Avoids sugars, high sodium snacks, junk foods and extra snacking.        Fall Risk: Fall Risk  09/17/2019 08/29/2018 12/26/2016 08/13/2015  Falls in the past year? 0 0 No No  Number falls in past yr: 0 - - -  Injury with Fall? 0 - - -    FALL RISK PREVENTION PERTAINING TO THE HOME:  Any stairs in or around the home? Yes  If so, are there any without handrails? No   Home free of loose throw rugs  in walkways, pet beds, electrical cords, etc? Yes  Adequate lighting in your home to reduce risk of falls? Yes   ASSISTIVE DEVICES UTILIZED TO PREVENT FALLS:  Life alert? No  Use of a cane, walker or w/c? No  Grab bars in the bathroom? No  Shower chair or bench in shower? No  Elevated toilet seat or a handicapped toilet? No    TIMED UP AND GO:  Was the test performed? No .    Depression Screen PHQ 2/9 Scores 09/17/2019 08/29/2018 12/26/2016 12/26/2016  PHQ - 2 Score 0 2 0 0  PHQ- 9 Score - 7 0 -     Cognitive Function: Declined today.  Immunization History  Administered Date(s) Administered  . Fluad Quad(high Dose 65+) 07/09/2019  . Influenza, High Dose Seasonal PF 07/18/2018  . Pneumococcal Conjugate-13 12/26/2016  . Pneumococcal Polysaccharide-23 07/18/2018  . Td 05/31/2005  . Tdap 02/01/2010  . Zoster 08/13/2015    Qualifies for Shingles Vaccine? Yes  Zostavax completed 08/12/18. Education has been provided regarding the importance of this vaccine. Pt has been advised to call insurance company to determine out of pocket expense. Advised may also receive vaccine at local pharmacy or Health Dept. Verbalized acceptance and understanding.  Tdap: Up to date  Flu Vaccine: Up to date  Pneumococcal Vaccine: Completed series  Screening Tests Health Maintenance  Topic Date Due  . TETANUS/TDAP  02/02/2020  . DEXA SCAN  08/12/2020  . MAMMOGRAM  09/04/2020  . COLONOSCOPY  11/19/2021  . INFLUENZA VACCINE  Completed  . Hepatitis C Screening  Completed  . PNA vac Low Risk Adult  Completed    Cancer Screenings:  Colorectal Screening: Completed 11/19/18. Repeat every 3 years.  Mammogram: Completed 09/04/18.   Bone Density: Completed 08/13/15. Results reflect OSTEOPENIA. Repeat every 5 years.   Lung Cancer Screening: (Low Dose CT Chest recommended if Age 29-80 years, 30 pack-year currently smoking OR have quit w/in 15years.) does not qualify.   Additional  Screening:  Hepatitis C Screening: Up to date  Vision Screening: Recommended annual ophthalmology exams for early detection of glaucoma and other disorders of the eye.  Dental Screening: Recommended annual dental exams for proper oral hygiene  Community Resource Referral:  CRR required this visit?  No       Plan:  I have personally reviewed and addressed the Medicare Annual Wellness questionnaire and have noted the following in the patient's chart:  A. Medical and social history B. Use of alcohol, tobacco or illicit drugs  C. Current medications and supplements D. Functional ability and status E.  Nutritional status F.  Physical activity G. Advance directives H. List of other physicians I.  Hospitalizations, surgeries, and ER visits in previous 12 months J.  [place] such as hearing and vision if needed, cognitive and depression L. Referrals and appointments   In addition, I have reviewed and discussed with patient certain preventive protocols, quality metrics, and best practice recommendations. A written personalized care plan for preventive services as well as general preventive health recommendations were provided to patient. Nurse Health Advisor  Signed,    Leni Pankonin [place], [place]  [phone] Nurse Health Advisor   Nurse Notes: None.

## 2019-09-17 ENCOUNTER — Other Ambulatory Visit: Payer: Self-pay

## 2019-09-17 ENCOUNTER — Encounter: Payer: Self-pay | Admitting: Family Medicine

## 2019-09-17 ENCOUNTER — Ambulatory Visit (INDEPENDENT_AMBULATORY_CARE_PROVIDER_SITE_OTHER): Payer: Medicare Other | Admitting: Family Medicine

## 2019-09-17 ENCOUNTER — Ambulatory Visit (INDEPENDENT_AMBULATORY_CARE_PROVIDER_SITE_OTHER): Payer: Medicare Other

## 2019-09-17 VITALS — BP 126/74 | HR 50 | Temp 97.5°F | Resp 18 | Ht 66.0 in | Wt 184.6 lb

## 2019-09-17 DIAGNOSIS — M8580 Other specified disorders of bone density and structure, unspecified site: Secondary | ICD-10-CM | POA: Diagnosis not present

## 2019-09-17 DIAGNOSIS — G4700 Insomnia, unspecified: Secondary | ICD-10-CM | POA: Diagnosis not present

## 2019-09-17 DIAGNOSIS — E7849 Other hyperlipidemia: Secondary | ICD-10-CM | POA: Diagnosis not present

## 2019-09-17 DIAGNOSIS — Z0000 Encounter for general adult medical examination without abnormal findings: Secondary | ICD-10-CM

## 2019-09-17 DIAGNOSIS — E559 Vitamin D deficiency, unspecified: Secondary | ICD-10-CM

## 2019-09-17 NOTE — Patient Instructions (Signed)
Mary [name] , Thank you for taking time to come for your Medicare Wellness Visit. I appreciate your ongoing commitment to your health goals. Please review the following plan we discussed and let me know if I can assist you in the future.   Screening recommendations/referrals: Colonoscopy: Up to date, due 11/2021 Mammogram: Up to date, due 08/2020 Bone Density: Up to date, due 08/2020 Recommended yearly ophthalmology/optometry visit for glaucoma screening and checkup Recommended yearly dental visit for hygiene and checkup  Vaccinations: Influenza vaccine: Up to date Pneumococcal vaccine: Completed series Tdap vaccine: Up to date, due 01/2020 Shingles vaccine: Pt declines today.     Advanced directives: Please bring a copy of your POA (Power of Attorney) and/or Living Will to your next appointment.   Conditions/risks identified: Continue to work on a healthy diet and avoiding high fatty foods.   Next appointment: 10:40 AM today with Dr Rosanna Randy. Declined scheduling an AWV for 2021 at this time.   Preventive Care 31 Years and Older, Female Preventive care refers to lifestyle choices and visits with your health care provider that can promote health and wellness. What does preventive care include?  A yearly physical exam. This is also called an annual well check.  Dental exams once or twice a year.  Routine eye exams. Ask your health care provider how often you should have your eyes checked.  Personal lifestyle choices, including:  Daily care of your teeth and gums.  Regular physical activity.  Eating a healthy diet.  Avoiding tobacco and drug use.  Limiting alcohol use.  Practicing safe sex.  Taking low-dose aspirin every day.  Taking vitamin and mineral supplements as recommended by your health care provider. What happens during an annual well check? The services and screenings done by your health care provider during your annual well check will depend on your age, overall  health, lifestyle risk factors, and family history of disease. Counseling  Your health care provider may ask you questions about your:  Alcohol use.  Tobacco use.  Drug use.  Emotional well-being.  Home and relationship well-being.  Sexual activity.  Eating habits.  History of falls.  Memory and ability to understand (cognition).  Work and work Statistician.  Reproductive health. Screening  You may have the following tests or measurements:  Height, weight, and BMI.  Blood pressure.  Lipid and cholesterol levels. These may be checked every 5 years, or more frequently if you are over 79 years old.  Skin check.  Lung cancer screening. You may have this screening every year starting at age 6 if you have a 30-pack-year history of smoking and currently smoke or have quit within the past 15 years.  Fecal occult blood test (FOBT) of the stool. You may have this test every year starting at age 73.  Flexible sigmoidoscopy or colonoscopy. You may have a sigmoidoscopy every 5 years or a colonoscopy every 10 years starting at age 65.  Hepatitis C blood test.  Hepatitis B blood test.  Sexually transmitted disease (STD) testing.  Diabetes screening. This is done by checking your blood sugar (glucose) after you have not eaten for a while (fasting). You may have this done every 1-3 years.  Bone density scan. This is done to screen for osteoporosis. You may have this done starting at age 18.  Mammogram. This may be done every 1-2 years. Talk to your health care provider about how often you should have regular mammograms. Talk with your health care provider about your test results,  treatment options, and if necessary, the need for more tests. Vaccines  Your health care provider may recommend certain vaccines, such as:  Influenza vaccine. This is recommended every year.  Tetanus, diphtheria, and acellular pertussis (Tdap, Td) vaccine. You may need a Td booster every 10 years.   Zoster vaccine. You may need this after age 28.  Pneumococcal 13-valent conjugate (PCV13) vaccine. One dose is recommended after age 88.  Pneumococcal polysaccharide (PPSV23) vaccine. One dose is recommended after age 39. Talk to your health care provider about which screenings and vaccines you need and how often you need them. This information is not intended to replace advice given to you by your health care provider. Make sure you discuss any questions you have with your health care provider. Document Released: 10/23/2015 Document Revised: 06/15/2016 Document Reviewed: 07/28/2015 Elsevier Interactive Patient Education  2017 [place] Prevention in the Home Falls can cause injuries. They can happen to people of all ages. There are many things you can do to make your home safe and to help prevent falls. What can I do on the outside of my home?  Regularly fix the edges of walkways and driveways and fix any cracks.  Remove anything that might make you trip as you walk through a door, such as a raised step or threshold.  Trim any bushes or trees on the path to your home.  Use bright outdoor lighting.  Clear any walking paths of anything that might make someone trip, such as rocks or tools.  Regularly check to see if handrails are loose or broken. Make sure that both sides of any steps have handrails.  Any raised decks and porches should have guardrails on the edges.  Have any leaves, snow, or ice cleared regularly.  Use sand or salt on walking paths during winter.  Clean up any spills in your garage right away. This includes oil or grease spills. What can I do in the bathroom?  Use night lights.  Install grab bars by the toilet and in the tub and shower. Do not use towel bars as grab bars.  Use non-skid mats or decals in the tub or shower.  If you need to sit down in the shower, use a plastic, non-slip stool.  Keep the floor dry. Clean up any water that spills  on the floor as soon as it happens.  Remove soap buildup in the tub or shower regularly.  Attach bath mats securely with double-sided non-slip rug tape.  Do not have throw rugs and other things on the floor that can make you trip. What can I do in the bedroom?  Use night lights.  Make sure that you have a light by your bed that is easy to reach.  Do not use any sheets or blankets that are too big for your bed. They should not hang down onto the floor.  Have a firm chair that has side arms. You can use this for support while you get dressed.  Do not have throw rugs and other things on the floor that can make you trip. What can I do in the kitchen?  Clean up any spills right away.  Avoid walking on wet floors.  Keep items that you use a lot in easy-to-reach places.  If you need to reach something above you, use a strong step stool that has a grab bar.  Keep electrical cords out of the way.  Do not use floor polish or wax that makes floors  slippery. If you must use wax, use non-skid floor wax.  Do not have throw rugs and other things on the floor that can make you trip. What can I do with my stairs?  Do not leave any items on the stairs.  Make sure that there are handrails on both sides of the stairs and use them. Fix handrails that are broken or loose. Make sure that handrails are as long as the stairways.  Check any carpeting to make sure that it is firmly attached to the stairs. Fix any carpet that is loose or worn.  Avoid having throw rugs at the top or bottom of the stairs. If you do have throw rugs, attach them to the floor with carpet tape.  Make sure that you have a light switch at the top of the stairs and the bottom of the stairs. If you do not have them, ask someone to add them for you. What else can I do to help prevent falls?  Wear shoes that:  Do not have high heels.  Have rubber bottoms.  Are comfortable and fit you well.  Are closed at the toe. Do  not wear sandals.  If you use a stepladder:  Make sure that it is fully opened. Do not climb a closed stepladder.  Make sure that both sides of the stepladder are locked into place.  Ask someone to hold it for you, if possible.  Clearly mark and make sure that you can see:  Any grab bars or handrails.  First and last steps.  Where the edge of each step is.  Use tools that help you move around (mobility aids) if they are needed. These include:  Canes.  Walkers.  Scooters.  Crutches.  Turn on the lights when you go into a dark area. Replace any light bulbs as soon as they burn out.  Set up your furniture so you have a clear path. Avoid moving your furniture around.  If any of your floors are uneven, fix them.  If there are any pets around you, be aware of where they are.  Review your medicines with your doctor. Some medicines can make you feel dizzy. This can increase your chance of falling. Ask your doctor what other things that you can do to help prevent falls. This information is not intended to replace advice given to you by your health care provider. Make sure you discuss any questions you have with your health care provider. Document Released: 07/23/2009 Document Revised: 03/03/2016 Document Reviewed: 10/31/2014 Elsevier Interactive Patient Education  2017 [employer].

## 2019-09-18 DIAGNOSIS — H401131 Primary open-angle glaucoma, bilateral, mild stage: Secondary | ICD-10-CM | POA: Diagnosis not present

## 2019-09-18 LAB — COMPREHENSIVE METABOLIC PANEL
ALT: 38 IU/L — ABNORMAL HIGH (ref 0–32)
AST: 29 IU/L (ref 0–40)
Albumin/Globulin Ratio: 2 (ref 1.2–2.2)
Albumin: 4.7 g/dL (ref 3.8–4.8)
Alkaline Phosphatase: 65 IU/L (ref 39–117)
BUN/Creatinine Ratio: 14 (ref 12–28)
BUN: 13 mg/dL (ref 8–27)
Bilirubin Total: 0.5 mg/dL (ref 0.0–1.2)
CO2: 21 mmol/L (ref 20–29)
Calcium: 9.5 mg/dL (ref 8.7–10.3)
Chloride: 104 mmol/L (ref 96–106)
Creatinine, Ser: 0.94 mg/dL (ref 0.57–1.00)
GFR calc Af Amer: 72 mL/min/{1.73_m2} (ref >59)
GFR calc non Af Amer: 63 mL/min/{1.73_m2} (ref >59)
Globulin, Total: 2.3 g/dL (ref 1.5–4.5)
Glucose: 80 mg/dL (ref 65–99)
Potassium: 5.1 mmol/L (ref 3.5–5.2)
Sodium: 142 mmol/L (ref 134–144)
Total Protein: 7 g/dL (ref 6.0–8.5)

## 2019-09-18 LAB — CBC WITH DIFFERENTIAL/PLATELET
Basophils Absolute: 0.1 10*3/uL (ref 0.0–0.2)
Basos: 1 %
EOS (ABSOLUTE): 0.1 10*3/uL (ref 0.0–0.4)
Eos: 2 %
Hematocrit: 42.2 % (ref 34.0–46.6)
Hemoglobin: 13.6 g/dL (ref 11.1–15.9)
Immature Grans (Abs): 0 10*3/uL (ref 0.0–0.1)
Immature Granulocytes: 0 %
Lymphocytes Absolute: 2.1 10*3/uL (ref 0.7–3.1)
Lymphs: 40 %
MCH: 29.2 pg (ref 26.6–33.0)
MCHC: 32.2 g/dL (ref 31.5–35.7)
MCV: 91 fL (ref 79–97)
Monocytes Absolute: 0.5 10*3/uL (ref 0.1–0.9)
Monocytes: 9 %
Neutrophils Absolute: 2.6 10*3/uL (ref 1.4–7.0)
Neutrophils: 48 %
Platelets: 320 10*3/uL (ref 150–450)
RBC: 4.65 x10E6/uL (ref 3.77–5.28)
RDW: 13 % (ref 11.7–15.4)
WBC: 5.3 10*3/uL (ref 3.4–10.8)

## 2019-09-18 LAB — TSH: TSH: 1.59 u[IU]/mL (ref 0.450–4.500)

## 2019-09-19 ENCOUNTER — Telehealth: Payer: Self-pay

## 2019-09-19 NOTE — Telephone Encounter (Signed)
-----   Message from Jerrol Banana., MD sent at 09/19/2019  7:55 AM EST ----- Labs in normal range.

## 2019-09-19 NOTE — Telephone Encounter (Signed)
Called and spoke with patient of lab results. She gave verbal understanding.

## 2019-11-23 ENCOUNTER — Ambulatory Visit: Payer: PRIVATE HEALTH INSURANCE | Attending: Internal Medicine

## 2019-11-23 DIAGNOSIS — Z23 Encounter for immunization: Secondary | ICD-10-CM | POA: Insufficient documentation

## 2019-11-23 NOTE — Progress Notes (Signed)
   Covid-19 Vaccination Clinic  Name:  Mary [name]    MRN: [id number] DOB: 09-27-1951  11/23/2019  [name] was observed post Covid-19 immunization for 15 minutes without incidence. She was provided with Vaccine Information Sheet and instruction to access the V-Safe system.   [name] was instructed to call 911 with any severe reactions post vaccine: Marland Kitchen Difficulty breathing  . Swelling of your face and throat  . A fast heartbeat  . A bad rash all over your body  . Dizziness and weakness    Immunizations Administered    Name Date Dose VIS Date Route   Pfizer COVID-19 Vaccine 11/23/2019  9:56 AM 0.3 mL 09/20/2019 Intramuscular   Manufacturer: [place]   Lot: [id number]   [place]: [id number]

## 2019-12-12 ENCOUNTER — Telehealth: Payer: Self-pay

## 2019-12-12 NOTE — Telephone Encounter (Signed)
Copied from [place] [id number]. Topic: General - Other >> Dec 11, 2019  3:30 PM Richardo Priest, NT wrote: Reason for CRM: Patient called in stating she would like to see if something can be re-coded, as she has received a bill for over $200, that her insurance will not cover. Patient states this is for her annual wellness visit on 09/17/19 and would like to see if anything can be done. Please advise.

## 2019-12-14 ENCOUNTER — Ambulatory Visit: Payer: PRIVATE HEALTH INSURANCE | Attending: Internal Medicine

## 2019-12-14 DIAGNOSIS — Z23 Encounter for immunization: Secondary | ICD-10-CM | POA: Insufficient documentation

## 2019-12-14 NOTE — Progress Notes (Signed)
   Covid-19 Vaccination Clinic  Name:  Mary [name]    MRN: [id number] DOB: Nov 10, 1950  12/14/2019  [name] was observed post Covid-19 immunization for 15 minutes without incident. She was provided with Vaccine Information Sheet and instruction to access the V-Safe system.   [name] was instructed to call 911 with any severe reactions post vaccine: Marland Kitchen Difficulty breathing  . Swelling of face and throat  . A fast heartbeat  . A bad rash all over body  . Dizziness and weakness   Immunizations Administered    Name Date Dose VIS Date Route   Pfizer COVID-19 Vaccine 12/14/2019 10:03 AM 0.3 mL 09/20/2019 Intramuscular   Manufacturer: [place]   Lot: [id number]   [place]: [id number]

## 2020-02-27 ENCOUNTER — Ambulatory Visit (HOSPITAL_COMMUNITY)
Admission: EM | Admit: 2020-02-27 | Discharge: 2020-02-27 | Disposition: A | Discharge status: Home / Self Care | Payer: Medicare Other | Attending: Internal Medicine | Admitting: Internal Medicine

## 2020-02-27 ENCOUNTER — Encounter (HOSPITAL_COMMUNITY): Payer: Self-pay

## 2020-02-27 ENCOUNTER — Other Ambulatory Visit: Payer: Self-pay

## 2020-02-27 DIAGNOSIS — N3001 Acute cystitis with hematuria: Secondary | ICD-10-CM | POA: Diagnosis not present

## 2020-02-27 DIAGNOSIS — N390 Urinary tract infection, site not specified: Secondary | ICD-10-CM

## 2020-02-27 LAB — POCT URINALYSIS DIP (DEVICE)
Glucose, UA: 100 mg/dL — AB
Nitrite: POSITIVE — AB
Protein, ur: 100 mg/dL — AB
Specific Gravity, Urine: 1.025 (ref 1.005–1.030)
Urobilinogen, UA: 2 mg/dL — ABNORMAL HIGH (ref 0.0–1.0)
pH: 5 (ref 5.0–8.0)

## 2020-02-27 MED ORDER — CEPHALEXIN 500 MG PO CAPS: 500.0000 mg | ORAL_CAPSULE | Freq: Two times a day (BID) | ORAL | 0 refills | Status: AC

## 2020-02-27 NOTE — ED Triage Notes (Signed)
Pt c/o frequent urination, dysuriax2 days.

## 2020-02-27 NOTE — ED Provider Notes (Signed)
[place]    CSN: [id number] Arrival date & time: 02/27/20  1343      History   Chief Complaint Chief Complaint  Patient presents with  . Urinary Tract Infection    HPI Mary [name] is a 68 y.o. female comes to urgent care with complaints of dysuria, urgency or frequency of 2 days duration.  Patient had lower abdominal pressure/discomfort associated with the symptoms.  Symptoms have been persistent and it seems to be worsening today.  She denies any flank pain, fever or chills.  No nausea or vomiting.  No blood in urine.  HPI  Past Medical History:  Diagnosis Date  . Hyperlipidemia   . UTI (lower urinary tract infection)     Patient Active Problem List   Diagnosis Date Noted  . Insomnia, persistent 07/14/2015  . Abnormal liver enzymes 07/14/2015  . HLD (hyperlipidemia) 07/14/2015  . Osteopenia 07/14/2015  . Cyst of ovary 07/14/2015  . Avitaminosis D 07/14/2015  . Chest pain 03/10/2011    Past Surgical History:  Procedure Laterality Date  . COLONOSCOPY WITH PROPOFOL N/A 11/19/2018   Procedure: COLONOSCOPY WITH PROPOFOL;  Surgeon: Manya Silvas, MD;  Location: [facility] ENDOSCOPY;  Service: Endoscopy;  Laterality: N/A;  . TONSILLECTOMY    . TUBAL LIGATION      OB History   No obstetric history on file.      Home Medications    Prior to Admission medications   Medication Sig Start Date End Date Taking? Authorizing Provider  cephALEXin (KEFLEX) 500 MG capsule Take 1 capsule (500 mg total) by mouth 2 (two) times daily for 5 days. 02/27/20 03/03/20  Chase Picket, MD  diphenhydrAMINE HCl (SLEEP AID) 50 MG/30ML LIQD Take 15 mLs by mouth daily as needed.    [provider]  Latanoprost (XELPROS) 0.005 % EMUL Apply 1 drop to eye daily. In both eyes    [provider]  temazepam (RESTORIL) 30 MG capsule TAKE 1 CAPSULE BY MOUTH AT BEDTIME AS NEEDED FOR SLEEP 09/10/18 02/27/20  Jerrol Banana., MD    Family History Family History    Problem Relation Age of Onset  . Heart disease Mother   . Diabetes Mother   . Alzheimer's disease Mother   . Heart attack Father   . Heart disease Father   . Skin cancer Father   . COPD Father   . Hypertension Brother   . Heart disease Brother   . Parkinson's disease Maternal Grandmother   . Breast cancer Neg Hx     Social History Social History   Tobacco Use  . Smoking status: Never Smoker  . Smokeless tobacco: Never Used  Substance Use Topics  . Alcohol use: Yes    Alcohol/week: 7.0 standard drinks    Types: 7 Glasses of wine per week    Comment: 1 drink a day  . Drug use: No     Allergies   Hydrocodone-acetaminophen, Amoxicillin, and Hydrocodone-acetaminophen   Review of Systems Review of Systems  Constitutional: Negative for activity change, fatigue and unexpected weight change.  Gastrointestinal: Positive for abdominal pain. Negative for diarrhea, nausea and vomiting.  Genitourinary: Positive for dysuria, flank pain, frequency and urgency. Negative for vaginal discharge.  Skin: Negative.   Neurological: Negative for dizziness, light-headedness and headaches.     Physical Exam Triage Vital Signs ED Triage Vitals  Enc Vitals Group     BP 02/27/20 1402 116/67     Pulse Rate 02/27/20 1402 77  Resp 02/27/20 1402 16     Temp 02/27/20 1402 98.5 F (36.9 C)     Temp Source 02/27/20 1402 Oral     SpO2 02/27/20 1402 95 %     Weight 02/27/20 1403 180 lb (81.6 kg)     Height 02/27/20 1403 5\' 6"  (1.676 m)     Head Circumference --      Peak Flow --      Pain Score 02/27/20 1403 0     Pain Loc --      Pain Edu? --      Excl. in [place]? --    No data found.  Updated Vital Signs BP 116/67   Pulse 77   Temp 98.5 F (36.9 C) (Oral)   Resp 16   Ht 5\' 6"  (1.676 m)   Wt 81.6 kg   SpO2 95%   BMI 29.05 kg/m   Visual Acuity Right Eye Distance:   Left Eye Distance:   Bilateral Distance:    Right Eye Near:   Left Eye Near:    Bilateral Near:      Physical Exam Vitals and nursing note reviewed.  Constitutional:      General: She is in acute distress.     Appearance: Normal appearance. She is not ill-appearing.  Cardiovascular:     Rate and Rhythm: Normal rate and regular rhythm.     Pulses: Normal pulses.     Heart sounds: Normal heart sounds.  Pulmonary:     Effort: Pulmonary effort is normal.     Breath sounds: Normal breath sounds.  Abdominal:     General: Bowel sounds are normal.     Palpations: There is no mass.     Tenderness: There is abdominal tenderness. There is no guarding or rebound.  Musculoskeletal:        General: Normal range of motion.  Skin:    Capillary Refill: Capillary refill takes less than 2 seconds.  Neurological:     General: No focal deficit present.     Mental Status: She is alert and oriented to person, place, and time.      UC Treatments / Results  Labs (all labs ordered are listed, but only abnormal results are displayed) Labs Reviewed  POCT URINALYSIS DIP (DEVICE) - Abnormal; Notable for the following components:      Result Value   Glucose, UA 100 (*)    Bilirubin Urine SMALL (*)    Ketones, ur TRACE (*)    Hgb urine dipstick MODERATE (*)    Protein, ur 100 (*)    Urobilinogen, UA 2.0 (*)    Nitrite POSITIVE (*)    Leukocytes,Ua LARGE (*)    All other components within normal limits  URINE CULTURE    EKG   Radiology No results found.  Procedures Procedures (including critical care time)  Medications Ordered in UC Medications - No data to display  Initial Impression / Assessment and Plan / UC Course  I have reviewed the triage vital signs and the nursing notes.  Pertinent labs & imaging results that were available during my care of the patient were reviewed by me and considered in my medical decision making (see chart for details).     1.  Acute cystitis with hematuria: Point-of-care urinalysis is positive for leukocyte esterase, hemoglobin, nitrite Urine  culture sent Keflex 500 mg twice daily for 5 days Return precautions given Antibiotics will be updated based on urine cultures. Final Clinical Impressions(s) / UC Diagnoses  Final diagnoses:  Acute cystitis with hematuria   Discharge Instructions   None    ED Prescriptions    Medication Sig Dispense Auth. Provider   cephALEXin (KEFLEX) 500 MG capsule Take 1 capsule (500 mg total) by mouth 2 (two) times daily for 5 days. 20 capsule Jammi Morrissette, Myrene Galas, MD     PDMP not reviewed this encounter.   Chase Picket, MD 02/27/20 1455

## 2020-02-29 LAB — URINE CULTURE: Culture: 40000 — AB

## 2020-03-11 ENCOUNTER — Other Ambulatory Visit: Payer: Self-pay

## 2020-03-11 ENCOUNTER — Ambulatory Visit
Admission: EM | Admit: 2020-03-11 | Discharge: 2020-03-11 | Disposition: A | Discharge status: Home / Self Care | Payer: Medicare Other | Attending: Emergency Medicine | Admitting: Emergency Medicine

## 2020-03-11 ENCOUNTER — Encounter: Payer: Self-pay | Admitting: Emergency Medicine

## 2020-03-11 DIAGNOSIS — N3000 Acute cystitis without hematuria: Secondary | ICD-10-CM | POA: Insufficient documentation

## 2020-03-11 DIAGNOSIS — R350 Frequency of micturition: Secondary | ICD-10-CM | POA: Insufficient documentation

## 2020-03-11 DIAGNOSIS — R300 Dysuria: Secondary | ICD-10-CM | POA: Diagnosis not present

## 2020-03-11 LAB — POCT URINALYSIS DIP (MANUAL ENTRY)
Blood, UA: NEGATIVE
Glucose, UA: 250 mg/dL — AB
Nitrite, UA: POSITIVE — AB
Protein Ur, POC: 100 mg/dL — AB
Spec Grav, UA: 1.01 (ref 1.010–1.025)
Urobilinogen, UA: >=8 E.U./dL — AB
pH, UA: 5 (ref 5.0–8.0)

## 2020-03-11 LAB — POCT FASTING CBG KUC MANUAL ENTRY: POCT Glucose (KUC): 94 mg/dL (ref 70–99)

## 2020-03-11 MED ORDER — CIPROFLOXACIN HCL 500 MG PO TABS: 500.0000 mg | ORAL_TABLET | Freq: Two times a day (BID) | ORAL | 0 refills | Status: AC

## 2020-03-11 NOTE — ED Provider Notes (Signed)
[facility]   CC: Burning with urination  SUBJECTIVE:  Mary [name] is a 69 y.o. female who complains of urinary frequency, urgency, dysuria, and incomplete emptying of bladder for the past 6 day.  Admits to delayed bathroom breaks and decreased water intake prior to symptoms.  Denies abdominal or flank pain.  Has tried OTC medications without relief.  Symptoms are made worse with urination.  Admits to similar symptoms in the past recently treated with keflex for UTI on 02/27/20. Reports minimal improvement with antibiotics.  Denies fever, chills, nausea, vomiting, abdominal pain, flank pain, abnormal vaginal discharge or bleeding, hematuria.    LMP: No LMP recorded. Patient is postmenopausal.  ROS: As in HPI.  All other pertinent ROS negative.     Past Medical History:  Diagnosis Date  . Hyperlipidemia   . UTI (lower urinary tract infection)    Past Surgical History:  Procedure Laterality Date  . COLONOSCOPY WITH PROPOFOL N/A 11/19/2018   Procedure: COLONOSCOPY WITH PROPOFOL;  Surgeon: Manya Silvas, MD;  Location: [facility] ENDOSCOPY;  Service: Endoscopy;  Laterality: N/A;  . TONSILLECTOMY    . TUBAL LIGATION     Allergies  Allergen Reactions  . Hydrocodone-Acetaminophen   . Amoxicillin Rash  . Hydrocodone-Acetaminophen Rash   No current facility-administered medications on file prior to encounter.   Current Outpatient Medications on File Prior to Encounter  Medication Sig Dispense Refill  . diphenhydrAMINE HCl (SLEEP AID) 50 MG/30ML LIQD Take 15 mLs by mouth daily as needed.    . Latanoprost (XELPROS) 0.005 % EMUL Apply 1 drop to eye daily. In both eyes    . [DISCONTINUED] temazepam (RESTORIL) 30 MG capsule TAKE 1 CAPSULE BY MOUTH AT BEDTIME AS NEEDED FOR SLEEP 30 capsule 4   Social History   Socioeconomic History  . Marital status: Married    Spouse name: Not on file  . Number of children: 2  . Years of education: Not on file  . Highest education level:  Associate degree: occupational, Hotel manager, or vocational program  Occupational History  . Occupation: retired  Tobacco Use  . Smoking status: Never Smoker  . Smokeless tobacco: Never Used  Substance and Sexual Activity  . Alcohol use: Yes    Alcohol/week: 7.0 standard drinks    Types: 7 Glasses of wine per week    Comment: 1 drink a day  . Drug use: No  . Sexual activity: Not on file  Other Topics Concern  . Not on file  Social History Narrative  . Not on file   Social Determinants of Health   Financial Resource Strain:   . Difficulty of Paying Living Expenses:   Food Insecurity:   . Worried About Charity fundraiser in the Last Year:   . Arboriculturist in the Last Year:   Transportation Needs:   . Film/video editor (Medical):   Marland Kitchen Lack of Transportation (Non-Medical):   Physical Activity: Insufficiently Active  . Days of Exercise per Week: 3 days  . Minutes of Exercise per Session: 40 min  Stress:   . Feeling of Stress :   Social Connections:   . Frequency of Communication with Friends and Family:   . Frequency of Social Gatherings with Friends and Family:   . Attends Religious Services:   . Active Member of Clubs or Organizations:   . Attends Archivist Meetings:   Marland Kitchen Marital Status:   Intimate Partner Violence:   . Fear of Current or  Ex-Partner:   . Emotionally Abused:   Marland Kitchen Physically Abused:   . Sexually Abused:    Family History  Problem Relation Age of Onset  . Heart disease Mother   . Diabetes Mother   . Alzheimer's disease Mother   . Heart attack Father   . Heart disease Father   . Skin cancer Father   . COPD Father   . Hypertension Brother   . Heart disease Brother   . Parkinson's disease Maternal Grandmother   . Breast cancer Neg Hx     OBJECTIVE:  Vitals:   03/11/20 1904 03/11/20 1905  BP:  120/76  Pulse:  97  Resp:  17  Temp:  98.5 F (36.9 C)  TempSrc:  Oral  SpO2:  92%  Weight: 178 lb 9.2 oz (81 kg)   Height: 5\' 6"   (1.676 m)    General appearance: Alert in no acute distress HEENT: NCAT.  Oropharynx clear.  Lungs: clear to auscultation bilaterally without adventitious breath sounds Heart: regular rate and rhythm.   Abdomen: soft; non-distended; no tenderness; bowel sounds present; no guarding Back: no CVA tenderness Extremities: no edema; symmetrical with no gross deformities Skin: warm and dry Neurologic: Ambulates from chair to exam table without difficulty Psychological: alert and cooperative; normal mood and affect  Labs Reviewed  POCT URINALYSIS DIP (MANUAL ENTRY) - Abnormal; Notable for the following components:      Result Value   Color, UA orange (*)    Clarity, UA cloudy (*)    Glucose, UA =250 (*)    Bilirubin, UA moderate (*)    Ketones, POC UA small (15) (*)    Protein Ur, POC =100 (*)    Urobilinogen, UA >=8.0 (*)    Nitrite, UA Positive (*)    Leukocytes, UA Large (3+) (*)    All other components within normal limits  URINE CULTURE  POCT FASTING CBG KUC MANUAL ENTRY    ASSESSMENT & PLAN:  1. Dysuria   2. Urinary frequency   3. Acute cystitis without hematuria     Meds ordered this encounter  Medications  . ciprofloxacin (CIPRO) 500 MG tablet    Sig: Take 1 tablet (500 mg total) by mouth 2 (two) times daily for 7 days.    Dispense:  14 tablet    Refill:  0    Order Specific Question:   Supervising Provider    Answer:   Raylene Everts [id number]   Urine concerning for UTI Urine culture sent.  We will call you with abnormal results.   Push fluids and get plenty of rest.   Take antibiotic as directed and to completion Follow up with PCP if symptoms persists Return here or go to ER if you have any new or worsening symptoms such as fever, worsening abdominal pain, nausea/vomiting, flank pain, etc...  Outlined signs and symptoms indicating need for more acute intervention. Patient verbalized understanding. After Visit Summary given.     Lestine Box,  PA-C 03/11/20 1936

## 2020-03-11 NOTE — Discharge Instructions (Signed)
Urine concerning for UTI Urine culture sent.  We will call you with abnormal results.   Push fluids and get plenty of rest.   Take antibiotic as directed and to completion Follow up with PCP if symptoms persists Return here or go to ER if you have any new or worsening symptoms such as fever, worsening abdominal pain, nausea/vomiting, flank pain, etc..Marland Kitchen

## 2020-03-11 NOTE — ED Triage Notes (Signed)
D/x with UTI 05/20 completed 2 courses of abx. 2 days after finishing abx started having frequent urination, burning and feels like she cant completley empty bladder.

## 2020-03-12 LAB — URINE CULTURE: Culture: NO GROWTH

## 2020-03-13 DIAGNOSIS — R110 Nausea: Secondary | ICD-10-CM | POA: Diagnosis not present

## 2020-03-13 DIAGNOSIS — R1084 Generalized abdominal pain: Secondary | ICD-10-CM | POA: Diagnosis not present

## 2020-03-13 DIAGNOSIS — R109 Unspecified abdominal pain: Secondary | ICD-10-CM | POA: Diagnosis not present

## 2020-03-13 DIAGNOSIS — R509 Fever, unspecified: Secondary | ICD-10-CM | POA: Diagnosis not present

## 2020-03-13 DIAGNOSIS — Z79899 Other long term (current) drug therapy: Secondary | ICD-10-CM | POA: Diagnosis not present

## 2020-03-13 DIAGNOSIS — Z880 Allergy status to penicillin: Secondary | ICD-10-CM | POA: Diagnosis not present

## 2020-03-13 DIAGNOSIS — R112 Nausea with vomiting, unspecified: Secondary | ICD-10-CM | POA: Diagnosis not present

## 2020-03-13 DIAGNOSIS — Z885 Allergy status to narcotic agent status: Secondary | ICD-10-CM | POA: Diagnosis not present

## 2020-03-13 DIAGNOSIS — I10 Essential (primary) hypertension: Secondary | ICD-10-CM | POA: Diagnosis not present

## 2020-03-13 DIAGNOSIS — R300 Dysuria: Secondary | ICD-10-CM | POA: Diagnosis not present

## 2020-03-13 DIAGNOSIS — N83201 Unspecified ovarian cyst, right side: Secondary | ICD-10-CM | POA: Diagnosis not present

## 2020-03-16 ENCOUNTER — Telehealth: Payer: Self-pay

## 2020-03-16 NOTE — Telephone Encounter (Signed)
Copied from [place] [phone]. Topic: General - Other >> Mar 16, 2020  8:52 AM Celene Kras wrote: Reason for CRM: Pt called stating that she was seen in the hospital this weekend. Pt states that she was seen for a cyst on her ovaries. Pt states that she is taking tylenol and ibprofen in rotation. Pt isnt sure if she should continue to take the medication. Please advise.

## 2020-03-17 NOTE — Progress Notes (Signed)
Established patient visit  I,Mary [name],acting as a scribe for Mary Durie, MD.,have documented all relevant documentation on the behalf of Mary Durie, MD,as directed by  Mary Durie, MD while in the presence of Mary Durie, MD.   Patient: Mary [name]   DOB: Mary 22, 1952   69 y.o. Female  MRN: [id number] Visit Date: 03/18/2020  Today's healthcare provider: Wilhemena Durie, MD   Chief Complaint  Patient presents with  . Follow-up  . Hyperlipidemia   Subjective    HPI  Pts husband died 2023/03/24 from dementia issues.  In the interim she has had 2 UTIs and a recent ED for fever and abdominal /pelvic disomfort. She said recently she has felt bloated. No Weight loss or other symptoms. No vaginal bleeding or GI symptoms. She underwent CT scan which showed 5.2 cm cystic mass on right which is confirmed by Mary [name].  They recommended f/u Mary [name] 3 months. Her fever has resolved. Lipid/Cholesterol, follow-up  Last Lipid Panel: Lab Results  Component Value Date   CHOL 181 09/19/2018   LDLCALC 108 (H) 09/19/2018   HDL 51 09/19/2018   TRIG 108 09/19/2018    She was last seen for this 6 months ago.  Management since that visit includes; labs checked showing-normal range. She reports good compliance with treatment. She is not having side effects. none She is following a Regular diet. Current exercise: none  Last metabolic panel Lab Results  Component Value Date   GLUCOSE 80 09/17/2019   NA 142 09/17/2019   K 5.1 09/17/2019   BUN 13 09/17/2019   CREATININE 0.94 09/17/2019   GFRNONAA 63 09/17/2019   GFRAA 72 09/17/2019   CALCIUM 9.5 09/17/2019   AST 29 09/17/2019   ALT 38 (H) 09/17/2019   The 10-year ASCVD risk score Mary Bussing DC Jr., et al., 2013) is: 10.3%  --------------------------------------------------------------------  Insomnia, persistent From 09/17/2019-Sleep hygiene.   Follow up ER visit  Patient was seen in ER for UTI 3 times since  March 23, 2020. She was treated for UTI symptoms. Treatment for this included notes in chart. She reports good compliance with treatment. She reports this condition is Unchanged.  --------------------------------------------------------------------    Medications: Outpatient Medications Prior to Visit  Medication Sig  . diphenhydrAMINE HCl (SLEEP AID) 50 MG/30ML LIQD Take 15 mLs by mouth daily as needed.  . Latanoprost (XELPROS) 0.005 % EMUL Apply 1 drop to eye daily. In both eyes  . ciprofloxacin (CIPRO) 500 MG tablet Take 1 tablet (500 mg total) by mouth 2 (two) times daily for 7 days. (Patient not taking: Reported on 03/18/2020)   No facility-administered medications prior to visit.    Review of Systems  Constitutional: Negative for appetite change, chills, fatigue and fever.  HENT: Negative.   Eyes: Negative.   Respiratory: Negative for chest tightness and shortness of breath.   Cardiovascular: Negative for chest pain and palpitations.  Gastrointestinal: Negative for abdominal pain, nausea and vomiting.  Endocrine: Negative.   Genitourinary: Positive for pelvic pain.       Mild with some bloating.  Musculoskeletal: Negative.   Skin: Negative.   Allergic/Immunologic: Negative.   Neurological: Negative for dizziness and weakness.  Psychiatric/Behavioral: Negative.        Objective    BP (!) 151/75 (BP Location: Right Arm, Patient Position: Sitting, Cuff Size: Large)   Pulse 75   Temp (!) 97.5 F (36.4 C) (Other (Comment))   Resp 16   Ht 5\' 6"  (1.676  m)   Wt 185 lb (83.9 kg)   SpO2 97%   BMI 29.86 kg/m  Wt Readings from Last 3 Encounters:  03/18/20 185 lb (83.9 kg)  03/11/20 178 lb 9.2 oz (81 kg)  02/27/20 180 lb (81.6 kg)      Physical Exam Vitals and nursing note reviewed.  Constitutional:      Appearance: Normal appearance. She is normal weight.  HENT:     Right Ear: Tympanic membrane normal.     Left Ear: Tympanic membrane normal.     Nose: Nose normal.       Mouth/Throat:     Mouth: Mucous membranes are moist.     Pharynx: Oropharynx is clear.  Eyes:     Conjunctiva/sclera: Conjunctivae normal.  Cardiovascular:     Rate and Rhythm: Normal rate and regular rhythm.     Pulses: Normal pulses.     Heart sounds: Normal heart sounds.  Pulmonary:     Effort: Pulmonary effort is normal.     Breath sounds: Normal breath sounds.  Abdominal:     General: Bowel sounds are normal.     Palpations: Abdomen is soft.  Genitourinary:    Comments: Pt declines pelvic exam today. Musculoskeletal:        General: Normal range of motion.     Cervical back: Normal range of motion and neck supple.  Skin:    General: Skin is warm and dry.  Neurological:     Mental Status: She is alert.  Psychiatric:        Mood and Affect: Mood normal.        Behavior: Behavior normal.        Thought Content: Thought content normal.        Judgment: Judgment normal.       No results found for any visits on 03/18/20.  Assessment & Plan     1. Cyst of right ovary Discussed 3 month f/u with pt but she wishes Gyn referral for peace of mind. F/u future as she mourns the death of her husband.   2. Pelvic mass  - CA 125  3. Adnexal mass  - CA 125  4. Ovarian mass  - CA 125    No follow-ups on file.         Phuong Hillary Cranford Mon, MD  [facility] [phone] (phone) [phone] (fax)  [place]

## 2020-03-18 ENCOUNTER — Encounter: Payer: Self-pay | Admitting: Family Medicine

## 2020-03-18 ENCOUNTER — Other Ambulatory Visit: Payer: Self-pay

## 2020-03-18 ENCOUNTER — Ambulatory Visit (INDEPENDENT_AMBULATORY_CARE_PROVIDER_SITE_OTHER): Payer: Medicare Other | Admitting: Family Medicine

## 2020-03-18 VITALS — BP 151/75 | HR 75 | Temp 97.5°F | Resp 16 | Ht 66.0 in | Wt 185.0 lb

## 2020-03-18 DIAGNOSIS — N9489 Other specified conditions associated with female genital organs and menstrual cycle: Secondary | ICD-10-CM

## 2020-03-18 DIAGNOSIS — R1909 Other intra-abdominal and pelvic swelling, mass and lump: Secondary | ICD-10-CM

## 2020-03-18 DIAGNOSIS — N838 Other noninflammatory disorders of ovary, fallopian tube and broad ligament: Secondary | ICD-10-CM

## 2020-03-18 DIAGNOSIS — N83201 Unspecified ovarian cyst, right side: Secondary | ICD-10-CM | POA: Diagnosis not present

## 2020-03-18 DIAGNOSIS — H401131 Primary open-angle glaucoma, bilateral, mild stage: Secondary | ICD-10-CM | POA: Diagnosis not present

## 2020-03-18 DIAGNOSIS — R1900 Intra-abdominal and pelvic swelling, mass and lump, unspecified site: Secondary | ICD-10-CM | POA: Diagnosis not present

## 2020-03-19 ENCOUNTER — Telehealth: Payer: Self-pay

## 2020-03-19 LAB — CA 125: Cancer Antigen (CA) 125: 18.3 U/mL (ref 0.0–38.1)

## 2020-03-19 NOTE — Telephone Encounter (Signed)
Patient given lab results through mychart and has read the providers comments.  

## 2020-03-19 NOTE — Telephone Encounter (Signed)
-----   Message from Jerrol Banana., MD sent at 03/19/2020  8:18 AM EDT ----- CA-125 is in normal range, this is good.

## 2020-03-25 ENCOUNTER — Ambulatory Visit (INDEPENDENT_AMBULATORY_CARE_PROVIDER_SITE_OTHER): Payer: Medicare Other | Admitting: Obstetrics and Gynecology

## 2020-03-25 ENCOUNTER — Encounter: Payer: Self-pay | Admitting: Obstetrics and Gynecology

## 2020-03-25 ENCOUNTER — Other Ambulatory Visit: Payer: Self-pay

## 2020-03-25 ENCOUNTER — Ambulatory Visit: Payer: Medicare Other | Admitting: Obstetrics and Gynecology

## 2020-03-25 VITALS — BP 118/74 | HR 64 | Temp 97.0°F | Resp 12 | Ht 66.0 in | Wt 181.4 lb

## 2020-03-25 DIAGNOSIS — N83201 Unspecified ovarian cyst, right side: Secondary | ICD-10-CM

## 2020-03-25 NOTE — Progress Notes (Signed)
GYNECOLOGY  VISIT   HPI: 69 y.o.   widow  Caucasian  female   G62P2 with Patient's last menstrual period was 10/11/1999 (approximate).   here for abnormal PUS in [facility] on 03-13-20 revealing Rt.ovarian cyst. --see CareEverywhere. CT scan identified the cyst, and she had a pelvic US in follow up.  Right ovarian cyst 5.3 x 5.0 cm with normal blood flow.  Left ovary not seen.  Uterus normal with EMS 3 mm. CA125 18.3.  Patient states her symptoms started with a UTI, but she thinks she had the ovarian cyst prior to this.  She treated three times with abx, last with Cipro.  She developed pressure in her abdomen and had difficulty emptying her bowels.  She had a sensation of a full bladder.  Developed a fever.  Patient with RLQ & LLQ discomfort, treated with Tylenol and Motrin as needed. States she also feels nausea.  Feels pressure on her bladder. Denies vaginal bleeding.   Husband had primary progressive aphasia and has passed away.   GYNECOLOGIC HISTORY: Patient's last menstrual period was 10/11/1999 (approximate). Contraception:  PMP Menopausal hormone therapy:  none Last mammogram:  09-04-18 3D/Neg/density B/BiRads1 Last pap smear: 08-13-15 Neg:Neg HR HPV        OB History    Gravida  2   Para  2   Term      Preterm      AB      Living  2     SAB      TAB      Ectopic      Multiple      Live Births                 Patient Active Problem List   Diagnosis Date Noted  . Insomnia, persistent 07/14/2015  . Abnormal liver enzymes 07/14/2015  . HLD (hyperlipidemia) 07/14/2015  . Osteopenia 07/14/2015  . Cyst of ovary 07/14/2015  . Avitaminosis D 07/14/2015  . Chest pain 03/10/2011    Past Medical History:  Diagnosis Date  . Hyperlipidemia   . Preglaucoma   . UTI (lower urinary tract infection)     Past Surgical History:  Procedure Laterality Date  . COLONOSCOPY WITH PROPOFOL N/A 11/19/2018   Procedure: COLONOSCOPY WITH PROPOFOL;   Surgeon: Manya Silvas, MD;  Location: [facility] ENDOSCOPY;  Service: Endoscopy;  Laterality: N/A;  . TONSILLECTOMY    . TUBAL LIGATION      Current Outpatient Medications  Medication Sig Dispense Refill  . diphenhydrAMINE HCl (SLEEP AID) 50 MG/30ML LIQD Take 15 mLs by mouth daily as needed.    . Latanoprost (XELPROS) 0.005 % EMUL Apply 1 drop to eye daily. In both eyes    . temazepam (RESTORIL) 15 MG capsule Take 1 capsule by mouth at bedtime as needed.     No current facility-administered medications for this visit.     ALLERGIES: Amoxicillin and Hydrocodone-acetaminophen  Family History  Problem Relation Age of Onset  . Heart disease Mother   . Diabetes Mother   . Alzheimer's disease Mother   . Heart attack Father   . Heart disease Father   . Skin cancer Father   . COPD Father   . Hypertension Father   . Hypertension Brother   . Heart disease Brother   . Parkinson's disease Maternal Grandmother   . Breast cancer Neg Hx     Social History   Socioeconomic History  . Marital status: Married    Spouse  name: Not on file  . Number of children: 2  . Years of education: Not on file  . Highest education level: Associate degree: occupational, Hotel manager, or vocational program  Occupational History  . Occupation: retired  Tobacco Use  . Smoking status: Never Smoker  . Smokeless tobacco: Never Used  Vaping Use  . Vaping Use: Never used  Substance and Sexual Activity  . Alcohol use: Yes    Alcohol/week: 7.0 standard drinks    Types: 7 Glasses of wine per week    Comment: 1 drink a day  . Drug use: No  . Sexual activity: Not Currently  Other Topics Concern  . Not on file  Social History Narrative  . Not on file   Social Determinants of Health   Financial Resource Strain:   . Difficulty of Paying Living Expenses:   Food Insecurity:   . Worried About Charity fundraiser in the Last Year:   . Arboriculturist in the Last Year:   Transportation Needs:   . Lexicographer (Medical):   Marland Kitchen Lack of Transportation (Non-Medical):   Physical Activity: Insufficiently Active  . Days of Exercise per Week: 3 days  . Minutes of Exercise per Session: 40 min  Stress:   . Feeling of Stress :   Social Connections:   . Frequency of Communication with Friends and Family:   . Frequency of Social Gatherings with Friends and Family:   . Attends Religious Services:   . Active Member of Clubs or Organizations:   . Attends Archivist Meetings:   Marland Kitchen Marital Status:   Intimate Partner Violence:   . Fear of Current or Ex-Partner:   . Emotionally Abused:   Marland Kitchen Physically Abused:   . Sexually Abused:     Review of Systems  Genitourinary: Positive for pelvic pain (RLQ and LLQ discomfort).  All other systems reviewed and are negative.   PHYSICAL EXAMINATION:    BP 118/74 (Cuff Size: Large)   Pulse 64   Temp (!) 97 F (36.1 C) (Temporal)   Resp 12   Ht 5\' 6"  (1.676 m)   Wt 181 lb 6.4 oz (82.3 kg)   LMP 10/11/1999 (Approximate)   BMI 29.28 kg/m     General appearance: alert, cooperative and appears stated age Head: Normocephalic, without obvious abnormality, atraumatic Neck: no adenopathy, supple, symmetrical, trachea midline and thyroid normal to inspection and palpation Lungs: clear to auscultation bilaterally Heart: regular rate and rhythm Abdomen: soft, non-tender, no masses,  no organomegaly Extremities: extremities normal, atraumatic, no cyanosis or edema Skin: Skin color, texture, turgor normal. No rashes or lesions Lymph nodes: Cervical, supraclavicular, and axillary nodes normal. No abnormal inguinal nodes palpated Neurologic: Grossly normal  Pelvic: External genitalia:  no lesions              Urethra:  normal appearing urethra with no masses, tenderness or lesions              Bartholins and Skenes: normal                 Vagina: normal appearing vagina with normal color and discharge, no lesions              Cervix: no lesions                 Bimanual Exam:  Uterus:  normal size, contour, position, consistency, mobility, non-tender  Adnexa: no mass, fullness, tenderness on left.  Right adnexal fullness and mild tenderness.              Rectal exam: Yes.  .  Confirms.              Anus:  normal sphincter tone, no lesions  Chaperone was present for exam.  ASSESSMENT  Right ovarian cyst.  Normal CA125.  PLAN  We discussed ovarian cysts.  Torsion risk reviewed.  Return for pelvic US.  I did discuss possible laparoscopic removal of tube and ovary. Literature on ovarian cysts to patient.    An After Visit Summary was printed and given to the patient.

## 2020-03-25 NOTE — Patient Instructions (Signed)
Ovarian Cyst An ovarian cyst is a fluid-filled sac on an ovary. The ovaries are organs that make eggs in women. Most ovarian cysts go away on their own and are not cancerous (are benign). Some cysts need treatment. Follow these instructions at home:  Take over-the-counter and prescription medicines only as told by your doctor.  Do not drive or use heavy machinery while taking prescription pain medicine.  Get pelvic exams and Pap tests as often as told by your doctor.  Return to your normal activities as told by your doctor. Ask your doctor what activities are safe for you.  Do not use any products that contain nicotine or tobacco, such as cigarettes and e-cigarettes. If you need help quitting, ask your doctor.  Keep all follow-up visits as told by your doctor. This is important. Contact a doctor if:  Your periods are: ? Late. ? Irregular. ? Painful.   Your periods stop.  You have pelvic pain that does not go away.  You have pressure on your bladder.  You have trouble making your bladder empty when you pee (urinate).  You have pain during sex.  You have any of the following in your belly (abdomen): ? A feeling of fullness. ? Pressure. ? Discomfort. ? Pain that does not go away. ? Swelling.  You feel sick most of the time.  You have trouble pooping (have constipation).  You are not as hungry as usual (you lose your appetite).  You get very bad acne.  You start to have more hair on your body and face.  You are gaining weight or losing weight without changing your exercise and eating habits.  You think you may be pregnant. Get help right away if:  You have belly pain that is very bad or gets worse.  You cannot eat or drink without throwing up (vomiting).  You suddenly get a fever.  Your period is a lot heavier than usual. This information is not intended to replace advice given to you by your health care provider. Make sure you discuss any questions you have  with your health care provider. Document Revised: 09/08/2017 Document Reviewed: 02/28/2016 Elsevier Patient Education  2020 [employer].

## 2020-03-26 ENCOUNTER — Telehealth: Payer: Self-pay | Admitting: Obstetrics and Gynecology

## 2020-03-26 NOTE — Telephone Encounter (Signed)
Spoke with patient regarding benefits for recommended ultrasound. Patient is aware that ultrasound is transvaginal. Patient acknowledges understanding of information presented. Patient is aware of cancellation policy. Patient scheduled appointment for 04/02/2020 at 1230PM with Brook A. Quincy Simmonds, MD, Cherlynn June. Encounter closed.

## 2020-04-01 ENCOUNTER — Other Ambulatory Visit: Payer: Self-pay

## 2020-04-02 ENCOUNTER — Ambulatory Visit (INDEPENDENT_AMBULATORY_CARE_PROVIDER_SITE_OTHER): Payer: Medicare Other

## 2020-04-02 ENCOUNTER — Encounter: Payer: Medicare Other | Admitting: Obstetrics and Gynecology

## 2020-04-02 ENCOUNTER — Telehealth: Payer: Self-pay | Admitting: Obstetrics and Gynecology

## 2020-04-02 ENCOUNTER — Encounter: Payer: Self-pay | Admitting: Obstetrics and Gynecology

## 2020-04-02 ENCOUNTER — Ambulatory Visit (INDEPENDENT_AMBULATORY_CARE_PROVIDER_SITE_OTHER): Payer: Medicare Other | Admitting: Obstetrics and Gynecology

## 2020-04-02 VITALS — BP 130/76 | HR 60 | Temp 97.2°F | Ht 66.0 in | Wt 181.0 lb

## 2020-04-02 DIAGNOSIS — N9489 Other specified conditions associated with female genital organs and menstrual cycle: Secondary | ICD-10-CM

## 2020-04-02 DIAGNOSIS — N83201 Unspecified ovarian cyst, right side: Secondary | ICD-10-CM

## 2020-04-02 NOTE — Telephone Encounter (Signed)
Please precert and schedule surgery for my patient who needs a laparoscopic bilateral salpingo-oophorectomy and collection of pelvic washings for a right adnexal mass.   Patient is having some discomfort and is ready to proceed forward.

## 2020-04-02 NOTE — Progress Notes (Signed)
GYNECOLOGY  VISIT   HPI: 69 y.o.   Married  Caucasian  female   G2P2 with Patient's last menstrual period was 10/11/1999 (approximate).   here for pelvic ultrasound.   CT scan showed a 5.3 x 5.0 cm right ovarian cyst on 03/13/20.  CA125 18.3.  States she cannot do a lot without having pain.  She does have nausea is she is too active.   She denies vaginal bleeding.   GYNECOLOGIC HISTORY: Patient's last menstrual period was 10/11/1999 (approximate). Contraception: PMP Menopausal hormone therapy:  none Last mammogram:  09-04-18 3D/Neg/density B/BiRads1 Last pap smear: 08-13-15 Neg:Neg HR HPV        OB History    Gravida  2   Para  2   Term      Preterm      AB      Living  2     SAB      TAB      Ectopic      Multiple      Live Births                 Patient Active Problem List   Diagnosis Date Noted  . Insomnia, persistent 07/14/2015  . Abnormal liver enzymes 07/14/2015  . HLD (hyperlipidemia) 07/14/2015  . Osteopenia 07/14/2015  . Cyst of ovary 07/14/2015  . Avitaminosis D 07/14/2015  . Chest pain 03/10/2011    Past Medical History:  Diagnosis Date  . Hyperlipidemia   . Preglaucoma   . UTI (lower urinary tract infection)     Past Surgical History:  Procedure Laterality Date  . COLONOSCOPY WITH PROPOFOL N/A 11/19/2018   Procedure: COLONOSCOPY WITH PROPOFOL;  Surgeon: Manya Silvas, MD;  Location: [facility] ENDOSCOPY;  Service: Endoscopy;  Laterality: N/A;  . TONSILLECTOMY    . TUBAL LIGATION      Current Outpatient Medications  Medication Sig Dispense Refill  . diphenhydrAMINE HCl (SLEEP AID) 50 MG/30ML LIQD Take 15 mLs by mouth daily as needed.    . Latanoprost (XELPROS) 0.005 % EMUL Apply 1 drop to eye daily. In both eyes    . temazepam (RESTORIL) 15 MG capsule Take 1 capsule by mouth at bedtime as needed.     No current facility-administered medications for this visit.     ALLERGIES: Amoxicillin and Hydrocodone-acetaminophen  Family  History  Problem Relation Age of Onset  . Heart disease Mother   . Diabetes Mother   . Alzheimer's disease Mother   . Heart attack Father   . Heart disease Father   . Skin cancer Father   . COPD Father   . Hypertension Father   . Hypertension Brother   . Heart disease Brother   . Parkinson's disease Maternal Grandmother   . Breast cancer Neg Hx     Social History   Socioeconomic History  . Marital status: Married    Spouse name: Not on file  . Number of children: 2  . Years of education: Not on file  . Highest education level: Associate degree: occupational, Hotel manager, or vocational program  Occupational History  . Occupation: retired  Tobacco Use  . Smoking status: Never Smoker  . Smokeless tobacco: Never Used  Vaping Use  . Vaping Use: Never used  Substance and Sexual Activity  . Alcohol use: Yes    Alcohol/week: 7.0 standard drinks    Types: 7 Glasses of wine per week    Comment: 1 drink a day  . Drug use: No  .  Sexual activity: Not Currently  Other Topics Concern  . Not on file  Social History Narrative  . Not on file   Social Determinants of Health   Financial Resource Strain:   . Difficulty of Paying Living Expenses:   Food Insecurity:   . Worried About Charity fundraiser in the Last Year:   . Arboriculturist in the Last Year:   Transportation Needs:   . Film/video editor (Medical):   Marland Kitchen Lack of Transportation (Non-Medical):   Physical Activity: Insufficiently Active  . Days of Exercise per Week: 3 days  . Minutes of Exercise per Session: 40 min  Stress:   . Feeling of Stress :   Social Connections:   . Frequency of Communication with Friends and Family:   . Frequency of Social Gatherings with Friends and Family:   . Attends Religious Services:   . Active Member of Clubs or Organizations:   . Attends Archivist Meetings:   Marland Kitchen Marital Status:   Intimate Partner Violence:   . Fear of Current or Ex-Partner:   . Emotionally Abused:     Marland Kitchen Physically Abused:   . Sexually Abused:     Review of Systems  All other systems reviewed and are negative.   PHYSICAL EXAMINATION:    BP 130/76 (Cuff Size: Large)   Pulse 60   Temp (!) 97.2 F (36.2 C) (Temporal)   Ht 5\' 6"  (1.676 m)   Wt 181 lb (82.1 kg)   LMP 10/11/1999 (Approximate)   BMI 29.21 kg/m     General appearance: alert, cooperative and appears stated age  Pelvic US Uterus no myometrial masses.  EMS  1.39 mm. Ovaries atrophic.  55 x 49 x 44 cm simple cystic right adnexal mass separate from the right ovary.  No free fluid.  ASSESSMENT  Right adnexal cyst.  Possible paratubal versus paraovarian cyst.  Is suspect this is benign.   Normal CA125.   PLAN  Due to patient's symptoms of pain, will plan for laparoscopic BSO with collection of pelvic washings.  Risks, benefits, and alternatives reviewed with the patient who wishes to proceed. Risks of laparoscopy include but are not limited to bleeding, infection, damage to surrounding organs, reaction to anesthesia, pneumonia, DVT, PE, death, hernia formation, and need for reoperation.   ___36___ minute consultation.

## 2020-04-06 NOTE — Telephone Encounter (Signed)
Spoke with patient regarding surgery benefits. Patient acknowledges understanding of information presented. Patient is aware that benefits presented are for professional benefits only. Patient is aware that once surgery is scheduled, the hospital will call with separate benefits. Patient is aware of surgery cancellation policy.   Patient is ready to proceed with scheduling and is requesting "as soon as possible."  Routing to [place], Therapist, sports for scheduling.

## 2020-04-06 NOTE — Telephone Encounter (Signed)
Spoke with patient. Lap BSO/collection of pelvic washings scheduled for 05/05/2020 at 7:30 pm at [facility]. Patient has received 2 doses of COVID 19 vaccine. Per the new [facility] guidelines since she is fully vaccinated as long as she is asymptomatic, the patient does not need to  follow 4 day pre-op quarantine. If she is fully vaccinated, asymptomatic, and has not been exposed to anyone with  COVID in the past 14 days, pre-op COVID testing is not required or recommended. Patient verbalizes understanding and will notify the office if she develops any COVID symptoms or is exposed to [place] prior to surgery. 2 week post op scheduled for 05/20/2020 at 4:30 pm with Dr.Silva. Surgery instructions reviewed and mailed to patient's verified home address on file.   Routing to provider and will close encounter.

## 2020-04-07 NOTE — Telephone Encounter (Signed)
Patient will need preop visit.

## 2020-04-09 NOTE — Telephone Encounter (Signed)
Spoke with patient. Pre op scheduled for 04/16/2020 at 1 pm with Dr.Silva. Patient is agreeable to date and time.  Routing to provider and will close encounter.

## 2020-04-15 NOTE — Progress Notes (Signed)
GYNECOLOGY  VISIT   HPI: 69 y.o.   Married  Caucasian  female   G2P2 with Patient's last menstrual period was 10/11/1999 (approximate).   here for surgical consult.  Patient has a large right paratubal cyst that will be removed through laparoscopy.  Size is 5.3 x 5.0 on CT scan.  CA125 18.3. Pelvic ultrasound shows a normal uterus and atrophic ovaries.  No free fluid was seen.  She has more discomfort if she is sitting a lot. On vaginal bleeding.  She has some urinary urgency and frequency.  No health changes.  Denies chest pain or shortness of breath.  She does not tolerate narcotics.   She received her Covid vaccines, last one in March or April.  GYNECOLOGIC HISTORY: Patient's last menstrual period was 10/11/1999 (approximate). Contraception:  PMP Menopausal hormone therapy:  none Last mammogram:  09-04-18 3D/Neg/density B/BiRads1 Last pap smear:  08-13-15 Neg:Neg HR HPV        OB History    Gravida  2   Para  2   Term      Preterm      AB      Living  2     SAB      TAB      Ectopic      Multiple      Live Births                 Patient Active Problem List   Diagnosis Date Noted  . Insomnia, persistent 07/14/2015  . Abnormal liver enzymes 07/14/2015  . HLD (hyperlipidemia) 07/14/2015  . Osteopenia 07/14/2015  . Cyst of ovary 07/14/2015  . Avitaminosis D 07/14/2015  . Chest pain 03/10/2011    Past Medical History:  Diagnosis Date  . Hyperlipidemia   . Preglaucoma   . UTI (lower urinary tract infection)     Past Surgical History:  Procedure Laterality Date  . COLONOSCOPY WITH PROPOFOL N/A 11/19/2018   Procedure: COLONOSCOPY WITH PROPOFOL;  Surgeon: Manya Silvas, MD;  Location: [facility] ENDOSCOPY;  Service: Endoscopy;  Laterality: N/A;  . TONSILLECTOMY    . TUBAL LIGATION      Current Outpatient Medications  Medication Sig Dispense Refill  . diphenhydrAMINE HCl (SLEEP AID) 50 MG/30ML LIQD Take 15 mLs by mouth daily as needed.     . Latanoprost (XELPROS) 0.005 % EMUL Apply 1 drop to eye daily. In both eyes    . temazepam (RESTORIL) 15 MG capsule Take 1 capsule by mouth at bedtime as needed.     No current facility-administered medications for this visit.     ALLERGIES: Amoxicillin and Hydrocodone-acetaminophen  Family History  Problem Relation Age of Onset  . Heart disease Mother   . Diabetes Mother   . Alzheimer's disease Mother   . Heart attack Father   . Heart disease Father   . Skin cancer Father   . COPD Father   . Hypertension Father   . Hypertension Brother   . Heart disease Brother   . Parkinson's disease Maternal Grandmother   . Breast cancer Neg Hx     Social History   Socioeconomic History  . Marital status: Married    Spouse name: Not on file  . Number of children: 2  . Years of education: Not on file  . Highest education level: Associate degree: occupational, Hotel manager, or vocational program  Occupational History  . Occupation: retired  Tobacco Use  . Smoking status: Never Smoker  . Smokeless tobacco: Never Used  Vaping Use  . Vaping Use: Never used  Substance and Sexual Activity  . Alcohol use: Yes    Alcohol/week: 7.0 standard drinks    Types: 7 Glasses of wine per week    Comment: 1 drink a day  . Drug use: No  . Sexual activity: Not Currently  Other Topics Concern  . Not on file  Social History Narrative  . Not on file   Social Determinants of Health   Financial Resource Strain:   . Difficulty of Paying Living Expenses:   Food Insecurity:   . Worried About Charity fundraiser in the Last Year:   . Arboriculturist in the Last Year:   Transportation Needs:   . Film/video editor (Medical):   Marland Kitchen Lack of Transportation (Non-Medical):   Physical Activity: Insufficiently Active  . Days of Exercise per Week: 3 days  . Minutes of Exercise per Session: 40 min  Stress:   . Feeling of Stress :   Social Connections:   . Frequency of Communication with Friends and  Family:   . Frequency of Social Gatherings with Friends and Family:   . Attends Religious Services:   . Active Member of Clubs or Organizations:   . Attends Archivist Meetings:   Marland Kitchen Marital Status:   Intimate Partner Violence:   . Fear of Current or Ex-Partner:   . Emotionally Abused:   Marland Kitchen Physically Abused:   . Sexually Abused:     Review of Systems  All other systems reviewed and are negative.   PHYSICAL EXAMINATION:    BP 122/68   Pulse 66   Ht 5\' 6"  (1.676 m)   Wt 185 lb (83.9 kg)   LMP 10/11/1999 (Approximate)   BMI 29.86 kg/m     General appearance: alert, cooperative and appears stated age Head: Normocephalic, without obvious abnormality, atraumatic Neck: no adenopathy, supple, symmetrical, trachea midline and thyroid normal to inspection and palpation Lungs: clear to auscultation bilaterally Heart: regular rate and rhythm Abdomen: soft, non-tender, no masses,  no organomegaly Extremities: extremities normal, atraumatic, no cyanosis or edema Skin: Skin color, texture, turgor normal. No rashes or lesions No abnormal inguinal nodes palpated Neurologic: Grossly normal  Pelvic: External genitalia:  no lesions              Urethra:  normal appearing urethra with no masses, tenderness or lesions              Bartholins and Skenes: normal                 Vagina: normal appearing vagina with normal color and discharge, no lesions              Cervix: no lesions                Bimanual Exam:  Uterus:  normal size, contour, position, consistency, mobility, non-tender              Adnexa: no mass, fullness, tenderness on left.  She has right adnexal fullness, nontender to palpation.        Chaperone was present for exam.  ASSESSMENT  Large right para-ovarian cyst.  Normal CA125.   PLAN  Will proceed with laparoscopic BSO with collection of pelvic washings. Risks, benefits, and alternatives discussed with the patient who wishes to proceed.  Motrin and  Tramadol for pain post op.

## 2020-04-16 ENCOUNTER — Ambulatory Visit (INDEPENDENT_AMBULATORY_CARE_PROVIDER_SITE_OTHER): Payer: Medicare Other | Admitting: Obstetrics and Gynecology

## 2020-04-16 ENCOUNTER — Encounter: Payer: Self-pay | Admitting: Obstetrics and Gynecology

## 2020-04-16 ENCOUNTER — Other Ambulatory Visit: Payer: Self-pay

## 2020-04-16 VITALS — BP 122/68 | HR 66 | Ht 66.0 in | Wt 185.0 lb

## 2020-04-16 DIAGNOSIS — N9489 Other specified conditions associated with female genital organs and menstrual cycle: Secondary | ICD-10-CM

## 2020-04-23 ENCOUNTER — Other Ambulatory Visit: Payer: Self-pay | Admitting: Family Medicine

## 2020-04-23 DIAGNOSIS — Z1231 Encounter for screening mammogram for malignant neoplasm of breast: Secondary | ICD-10-CM

## 2020-04-24 ENCOUNTER — Other Ambulatory Visit: Payer: Self-pay

## 2020-04-24 ENCOUNTER — Encounter (HOSPITAL_BASED_OUTPATIENT_CLINIC_OR_DEPARTMENT_OTHER): Payer: Self-pay | Admitting: Obstetrics and Gynecology

## 2020-04-24 NOTE — Progress Notes (Signed)
Spoke w/ via phone for pre-op interview---pt Lab needs dos----   None lab appt 05-01-20 at 1-15 for cbc, cmet t & S            COVID test ------05-01-2020 at 1100 am Arrive at -------530 am 05-05-2020 NPO after MN NO Solid Food.  Clear liquids from MN until---430 am Medications to take morning of surgery -----none Diabetic medication -----n/a Patient Special Instructions -----none Pre-Op special Istructions -----none Patient verbalized understanding of instructions that were given at this phone interview. Patient denies shortness of breath, chest pain, fever, cough a this phone interview.

## 2020-04-29 ENCOUNTER — Ambulatory Visit: Payer: Medicare Other | Admitting: Obstetrics and Gynecology

## 2020-05-01 ENCOUNTER — Other Ambulatory Visit (HOSPITAL_COMMUNITY)
Admission: RE | Admit: 2020-05-01 | Discharge: 2020-05-01 | Disposition: A | Discharge status: Home / Self Care | Payer: Medicare Other | Source: Ambulatory Visit | Attending: Obstetrics and Gynecology | Admitting: Obstetrics and Gynecology

## 2020-05-01 ENCOUNTER — Encounter (HOSPITAL_COMMUNITY)
Admission: RE | Admit: 2020-05-01 | Discharge: 2020-05-01 | Disposition: A | Discharge status: Home / Self Care | Payer: Medicare Other | Source: Ambulatory Visit | Attending: Neurological Surgery | Admitting: Neurological Surgery

## 2020-05-01 ENCOUNTER — Other Ambulatory Visit: Payer: Self-pay

## 2020-05-01 DIAGNOSIS — Z20822 Contact with and (suspected) exposure to covid-19: Secondary | ICD-10-CM | POA: Diagnosis not present

## 2020-05-01 DIAGNOSIS — Z01812 Encounter for preprocedural laboratory examination: Secondary | ICD-10-CM | POA: Diagnosis not present

## 2020-05-01 LAB — CBC
HCT: 41.8 % (ref 36.0–46.0)
Hemoglobin: 13.7 g/dL (ref 12.0–15.0)
MCH: 30.4 pg (ref 26.0–34.0)
MCHC: 32.8 g/dL (ref 30.0–36.0)
MCV: 92.9 fL (ref 80.0–100.0)
Platelets: 285 10*3/uL (ref 150–400)
RBC: 4.5 MIL/uL (ref 3.87–5.11)
RDW: 13.3 % (ref 11.5–15.5)
WBC: 5.1 10*3/uL (ref 4.0–10.5)
nRBC: 0 % (ref 0.0–0.2)

## 2020-05-01 LAB — COMPREHENSIVE METABOLIC PANEL
ALT: 46 U/L — ABNORMAL HIGH (ref 0–44)
AST: 33 U/L (ref 15–41)
Albumin: 4.6 g/dL (ref 3.5–5.0)
Alkaline Phosphatase: 51 U/L (ref 38–126)
Anion gap: 9 (ref 5–15)
BUN: 17 mg/dL (ref 8–23)
CO2: 27 mmol/L (ref 22–32)
Calcium: 9.3 mg/dL (ref 8.9–10.3)
Chloride: 104 mmol/L (ref 98–111)
Creatinine, Ser: 0.81 mg/dL (ref 0.44–1.00)
GFR calc Af Amer: >60 mL/min (ref >60)
GFR calc non Af Amer: >60 mL/min (ref >60)
Glucose, Bld: 96 mg/dL (ref 70–99)
Potassium: 4.8 mmol/L (ref 3.5–5.1)
Sodium: 140 mmol/L (ref 135–145)
Total Bilirubin: 0.8 mg/dL (ref 0.3–1.2)
Total Protein: 7.3 g/dL (ref 6.5–8.1)

## 2020-05-01 LAB — SARS CORONAVIRUS 2 (TAT 6-24 HRS): SARS Coronavirus 2: NEGATIVE

## 2020-05-02 ENCOUNTER — Encounter: Payer: Self-pay | Admitting: Obstetrics and Gynecology

## 2020-05-04 NOTE — Anesthesia Preprocedure Evaluation (Addendum)
Anesthesia Evaluation  Patient identified by MRN, date of birth, ID band Patient awake    Reviewed: Allergy & Precautions, NPO status , Patient's Chart, lab work & pertinent test results  History of Anesthesia Complications Negative for: history of anesthetic complications  Airway Mallampati: II  TM Distance: >3 FB Neck ROM: Full    Dental no notable dental hx. (+) Dental Advisory Given   Pulmonary neg pulmonary ROS,    Pulmonary exam normal        Cardiovascular negative cardio ROS Normal cardiovascular exam     Neuro/Psych negative neurological ROS     GI/Hepatic negative GI ROS, Neg liver ROS,   Endo/Other  negative endocrine ROS  Renal/GU negative Renal ROS     Musculoskeletal negative musculoskeletal ROS (+)   Abdominal   Peds  Hematology negative hematology ROS (+)   Anesthesia Other Findings   Reproductive/Obstetrics                            Anesthesia Physical Anesthesia Plan  ASA: II  Anesthesia Plan: General   Post-op Pain Management:    Induction: Intravenous  PONV Risk Score and Plan: 4 or greater and Ondansetron, Dexamethasone, Midazolam and Scopolamine patch - Pre-op  Airway Management Planned: Oral ETT  Additional Equipment:   Intra-op Plan:   Post-operative Plan: Extubation in OR  Informed Consent: I have reviewed the patients History and Physical, chart, labs and discussed the procedure including the risks, benefits and alternatives for the proposed anesthesia with the patient or authorized representative who has indicated his/her understanding and acceptance.     Dental advisory given  Plan Discussed with: Anesthesiologist and CRNA  Anesthesia Plan Comments:        Anesthesia Quick Evaluation

## 2020-05-05 ENCOUNTER — Other Ambulatory Visit: Payer: Self-pay

## 2020-05-05 ENCOUNTER — Encounter (HOSPITAL_BASED_OUTPATIENT_CLINIC_OR_DEPARTMENT_OTHER): Payer: Self-pay | Admitting: Obstetrics and Gynecology

## 2020-05-05 ENCOUNTER — Encounter (HOSPITAL_BASED_OUTPATIENT_CLINIC_OR_DEPARTMENT_OTHER)
Admission: RE | Disposition: A | Discharge status: Home / Self Care | Payer: Self-pay | Source: Home / Self Care | Attending: Obstetrics and Gynecology

## 2020-05-05 ENCOUNTER — Ambulatory Visit (HOSPITAL_BASED_OUTPATIENT_CLINIC_OR_DEPARTMENT_OTHER)
Admission: RE | Admit: 2020-05-05 | Discharge: 2020-05-05 | Disposition: A | Discharge status: Home / Self Care | Payer: Medicare Other | Attending: Obstetrics and Gynecology | Admitting: Obstetrics and Gynecology

## 2020-05-05 ENCOUNTER — Ambulatory Visit (HOSPITAL_BASED_OUTPATIENT_CLINIC_OR_DEPARTMENT_OTHER): Payer: Medicare Other | Admitting: Anesthesiology

## 2020-05-05 DIAGNOSIS — Z79899 Other long term (current) drug therapy: Secondary | ICD-10-CM | POA: Insufficient documentation

## 2020-05-05 DIAGNOSIS — N838 Other noninflammatory disorders of ovary, fallopian tube and broad ligament: Secondary | ICD-10-CM

## 2020-05-05 DIAGNOSIS — N736 Female pelvic peritoneal adhesions (postinfective): Secondary | ICD-10-CM | POA: Diagnosis not present

## 2020-05-05 DIAGNOSIS — H40009 Preglaucoma, unspecified, unspecified eye: Secondary | ICD-10-CM | POA: Insufficient documentation

## 2020-05-05 DIAGNOSIS — K660 Peritoneal adhesions (postprocedural) (postinfection): Secondary | ICD-10-CM | POA: Diagnosis not present

## 2020-05-05 DIAGNOSIS — M8580 Other specified disorders of bone density and structure, unspecified site: Secondary | ICD-10-CM | POA: Diagnosis not present

## 2020-05-05 DIAGNOSIS — G4700 Insomnia, unspecified: Secondary | ICD-10-CM | POA: Diagnosis not present

## 2020-05-05 DIAGNOSIS — N9489 Other specified conditions associated with female genital organs and menstrual cycle: Secondary | ICD-10-CM

## 2020-05-05 DIAGNOSIS — E785 Hyperlipidemia, unspecified: Secondary | ICD-10-CM | POA: Diagnosis not present

## 2020-05-05 HISTORY — DX: Other specified conditions associated with female genital organs and menstrual cycle: N94.89

## 2020-05-05 HISTORY — PX: LAPAROSCOPIC BILATERAL SALPINGO OOPHERECTOMY: SHX5890

## 2020-05-05 LAB — TYPE AND SCREEN
ABO/RH(D): A POS
Antibody Screen: NEGATIVE

## 2020-05-05 LAB — ABO/RH: ABO/RH(D): A POS

## 2020-05-05 SURGERY — SALPINGO-OOPHORECTOMY, BILATERAL, LAPAROSCOPIC
Anesthesia: General | Site: Abdomen | Laterality: Bilateral

## 2020-05-05 MED ORDER — ROCURONIUM BROMIDE 10 MG/ML (PF) SYRINGE
PREFILLED_SYRINGE | INTRAVENOUS | Status: AC
  Filled 2020-05-05: qty 10

## 2020-05-05 MED ORDER — CELECOXIB 200 MG PO CAPS
ORAL_CAPSULE | ORAL | Status: AC
  Filled 2020-05-05: qty 1

## 2020-05-05 MED ORDER — FENTANYL CITRATE (PF) 100 MCG/2ML IJ SOLN
INTRAMUSCULAR | Status: AC
  Filled 2020-05-05: qty 2

## 2020-05-05 MED ORDER — SCOPOLAMINE 1 MG/3DAYS TD PT72
1.0000 | MEDICATED_PATCH | TRANSDERMAL | Status: DC
  Administered 2020-05-05: 1.5 mg via TRANSDERMAL

## 2020-05-05 MED ORDER — ONDANSETRON HCL 4 MG/2ML IJ SOLN
INTRAMUSCULAR | Status: DC | PRN
  Administered 2020-05-05: 4 mg via INTRAVENOUS

## 2020-05-05 MED ORDER — PROPOFOL 10 MG/ML IV BOLUS
INTRAVENOUS | Status: AC
  Filled 2020-05-05: qty 40

## 2020-05-05 MED ORDER — SODIUM CHLORIDE 0.9 % IR SOLN
Status: DC | PRN
  Administered 2020-05-05: 3000 mL

## 2020-05-05 MED ORDER — ROCURONIUM BROMIDE 10 MG/ML (PF) SYRINGE
PREFILLED_SYRINGE | INTRAVENOUS | Status: DC | PRN
  Administered 2020-05-05: 70 mg via INTRAVENOUS

## 2020-05-05 MED ORDER — TRAMADOL HCL 50 MG PO TABS: 50.0000 mg | ORAL_TABLET | Freq: Four times a day (QID) | ORAL | 0 refills | Status: DC | PRN

## 2020-05-05 MED ORDER — DEXAMETHASONE SODIUM PHOSPHATE 10 MG/ML IJ SOLN
INTRAMUSCULAR | Status: AC
  Filled 2020-05-05: qty 1

## 2020-05-05 MED ORDER — DEXAMETHASONE SODIUM PHOSPHATE 10 MG/ML IJ SOLN
INTRAMUSCULAR | Status: DC | PRN
  Administered 2020-05-05 (×2): 5 mg via INTRAVENOUS

## 2020-05-05 MED ORDER — SCOPOLAMINE 1 MG/3DAYS TD PT72
MEDICATED_PATCH | TRANSDERMAL | Status: AC
  Filled 2020-05-05: qty 1

## 2020-05-05 MED ORDER — ONDANSETRON HCL 4 MG/2ML IJ SOLN
INTRAMUSCULAR | Status: AC
  Filled 2020-05-05: qty 2

## 2020-05-05 MED ORDER — BUPIVACAINE HCL (PF) 0.25 % IJ SOLN
INTRAMUSCULAR | Status: AC
  Filled 2020-05-05: qty 30

## 2020-05-05 MED ORDER — LIDOCAINE 2% (20 MG/ML) 5 ML SYRINGE
INTRAMUSCULAR | Status: DC | PRN
  Administered 2020-05-05: 100 mg via INTRAVENOUS

## 2020-05-05 MED ORDER — IBUPROFEN 800 MG PO TABS
800.0000 mg | ORAL_TABLET | Freq: Three times a day (TID) | ORAL | 0 refills | Status: DC | PRN
Start: 2020-05-05 — End: 2021-03-25

## 2020-05-05 MED ORDER — BUPIVACAINE HCL (PF) 0.25 % IJ SOLN
INTRAMUSCULAR | Status: DC | PRN
  Administered 2020-05-05: 7 mL

## 2020-05-05 MED ORDER — MIDAZOLAM HCL 2 MG/2ML IJ SOLN
INTRAMUSCULAR | Status: AC
  Filled 2020-05-05: qty 2

## 2020-05-05 MED ORDER — PROPOFOL 10 MG/ML IV BOLUS
INTRAVENOUS | Status: DC | PRN
  Administered 2020-05-05: 170 mg via INTRAVENOUS

## 2020-05-05 MED ORDER — LIDOCAINE 2% (20 MG/ML) 5 ML SYRINGE
INTRAMUSCULAR | Status: AC
  Filled 2020-05-05: qty 5

## 2020-05-05 MED ORDER — ACETAMINOPHEN 500 MG PO TABS
1000.0000 mg | ORAL_TABLET | Freq: Once | ORAL | Status: AC
  Administered 2020-05-05: 1000 mg via ORAL

## 2020-05-05 MED ORDER — PROMETHAZINE HCL 25 MG/ML IJ SOLN: 6.2500 mg | INTRAMUSCULAR | Status: DC | PRN

## 2020-05-05 MED ORDER — CELECOXIB 200 MG PO CAPS
200.0000 mg | ORAL_CAPSULE | Freq: Once | ORAL | Status: AC
  Administered 2020-05-05: 200 mg via ORAL

## 2020-05-05 MED ORDER — FENTANYL CITRATE (PF) 100 MCG/2ML IJ SOLN
INTRAMUSCULAR | Status: DC | PRN
  Administered 2020-05-05 (×3): 50 ug via INTRAVENOUS

## 2020-05-05 MED ORDER — FENTANYL CITRATE (PF) 100 MCG/2ML IJ SOLN: 25.0000 ug | INTRAMUSCULAR | Status: DC | PRN

## 2020-05-05 MED ORDER — SUGAMMADEX SODIUM 200 MG/2ML IV SOLN
INTRAVENOUS | Status: DC | PRN
  Administered 2020-05-05: 160 mg via INTRAVENOUS

## 2020-05-05 MED ORDER — LACTATED RINGERS IV SOLN: INTRAVENOUS | Status: DC

## 2020-05-05 MED ORDER — ACETAMINOPHEN 500 MG PO TABS
ORAL_TABLET | ORAL | Status: AC
  Filled 2020-05-05: qty 2

## 2020-05-05 MED ORDER — MIDAZOLAM HCL 2 MG/2ML IJ SOLN
INTRAMUSCULAR | Status: DC | PRN
  Administered 2020-05-05: 2 mg via INTRAVENOUS

## 2020-05-05 MED ORDER — GLYCOPYRROLATE PF 0.2 MG/ML IJ SOSY
PREFILLED_SYRINGE | INTRAMUSCULAR | Status: AC
  Filled 2020-05-05: qty 1

## 2020-05-05 MED ORDER — TRAMADOL HCL 50 MG PO TABS
50.0000 mg | ORAL_TABLET | Freq: Once | ORAL | Status: AC
  Administered 2020-05-05: 50 mg via ORAL

## 2020-05-05 MED ORDER — TRAMADOL HCL 50 MG PO TABS
ORAL_TABLET | ORAL | Status: AC
  Filled 2020-05-05: qty 1

## 2020-05-05 SURGICAL SUPPLY — 46 items
ADH SKN CLS APL DERMABOND .7 (GAUZE/BANDAGES/DRESSINGS) ×1
APL SRG 38 LTWT LNG FL B (MISCELLANEOUS) ×1
APPLICATOR ARISTA FLEXITIP XL (MISCELLANEOUS) ×3 IMPLANT
BAG RETRIEVAL 10 (BASKET) ×1
BAG RETRIEVAL 10MM (BASKET) ×1
CABLE HIGH FREQUENCY MONO STRZ (ELECTRODE) IMPLANT
CANISTER SUCT 3000ML PPV (MISCELLANEOUS) ×3 IMPLANT
COVER WAND RF STERILE (DRAPES) ×3 IMPLANT
DERMABOND ADVANCED (GAUZE/BANDAGES/DRESSINGS) ×2
DERMABOND ADVANCED .7 DNX12 (GAUZE/BANDAGES/DRESSINGS) ×1 IMPLANT
DRSG COVADERM PLUS 2X2 (GAUZE/BANDAGES/DRESSINGS) IMPLANT
DRSG OPSITE POSTOP 3X4 (GAUZE/BANDAGES/DRESSINGS) IMPLANT
DURAPREP 26ML APPLICATOR (WOUND CARE) ×3 IMPLANT
GAUZE 4X4 16PLY RFD (DISPOSABLE) ×3 IMPLANT
GLOVE BIO SURGEON STRL SZ 6.5 (GLOVE) ×4 IMPLANT
GLOVE BIO SURGEONS STRL SZ 6.5 (GLOVE) ×2
GOWN STRL REUS W/TWL LRG LVL3 (GOWN DISPOSABLE) ×6 IMPLANT
GOWN STRL REUS W/TWL XL LVL3 (GOWN DISPOSABLE) ×3 IMPLANT
HEMOSTAT ARISTA ABSORB 3G PWDR (HEMOSTASIS) ×3 IMPLANT
HOLDER FOLEY CATH W/STRAP (MISCELLANEOUS) ×3 IMPLANT
LIGASURE VESSEL 5MM BLUNT TIP (ELECTROSURGICAL) IMPLANT
NEEDLE INSUFFLATION 120MM (ENDOMECHANICALS) ×3 IMPLANT
NS IRRIG 1000ML POUR BTL (IV SOLUTION) ×3 IMPLANT
PACK LAPAROSCOPY BASIN (CUSTOM PROCEDURE TRAY) ×3 IMPLANT
PACK TRENDGUARD 450 HYBRID PRO (MISCELLANEOUS) ×1 IMPLANT
POUCH LAPAROSCOPIC INSTRUMENT (MISCELLANEOUS) ×3 IMPLANT
PROTECTOR NERVE ULNAR (MISCELLANEOUS) ×6 IMPLANT
SCISSORS LAP 5X35 DISP (ENDOMECHANICALS) IMPLANT
SET SUCTION IRRIG HYDROSURG (IRRIGATION / IRRIGATOR) ×3 IMPLANT
SET TUBE SMOKE EVAC HIGH FLOW (TUBING) ×3 IMPLANT
SHEARS HARMONIC ACE PLUS 36CM (ENDOMECHANICALS) ×3 IMPLANT
SLEEVE ADV FIXATION 5X100MM (TROCAR) IMPLANT
SUT PLAIN 3 0 FS 2 27 (SUTURE) IMPLANT
SUT VIC AB 4-0 SH 27 (SUTURE) ×3
SUT VIC AB 4-0 SH 27XANBCTRL (SUTURE) ×1 IMPLANT
SUT VICRYL 0 UR6 27IN ABS (SUTURE) ×3 IMPLANT
SYS BAG RETRIEVAL 10MM (BASKET) ×1
SYSTEM BAG RETRIEVAL 10MM (BASKET) ×1 IMPLANT
SYSTEM CARTER THOMASON II (TROCAR) ×3 IMPLANT
TOWEL OR 17X26 10 PK STRL BLUE (TOWEL DISPOSABLE) ×3 IMPLANT
TRAY FOLEY W/BAG SLVR 14FR (SET/KITS/TRAYS/PACK) ×3 IMPLANT
TRENDGUARD 450 HYBRID PRO PACK (MISCELLANEOUS) ×3
TROCAR ADV FIXATION 5X100MM (TROCAR) ×3 IMPLANT
TROCAR BLADELESS OPT 5 100 (ENDOMECHANICALS) ×3 IMPLANT
TROCAR XCEL NON-BLD 11X100MML (ENDOMECHANICALS) ×3 IMPLANT
WARMER LAPAROSCOPE (MISCELLANEOUS) ×3 IMPLANT

## 2020-05-05 NOTE — Anesthesia Procedure Notes (Signed)
Procedure Name: Intubation Date/Time: 05/05/2020 7:29 AM Performed by: Suan Halter, CRNA Pre-anesthesia Checklist: Patient identified, Emergency Drugs available, Suction available and Patient being monitored Patient Re-evaluated:Patient Re-evaluated prior to induction Oxygen Delivery Method: Circle system utilized Preoxygenation: Pre-oxygenation with 100% oxygen Induction Type: IV induction Ventilation: Mask ventilation without difficulty Laryngoscope Size: Mac and 3 Grade View: Grade I Tube type: Oral Tube size: 7.0 mm Number of attempts: 1 Airway Equipment and Method: Stylet and Oral airway Placement Confirmation: ETT inserted through vocal cords under direct vision,  positive ETCO2 and breath sounds checked- equal and bilateral Secured at: 22 cm Tube secured with: Tape Dental Injury: Teeth and Oropharynx as per pre-operative assessment

## 2020-05-05 NOTE — Op Note (Signed)
OPERATIVE REPORT   PREOPERATIVE DIAGNOSES:   Right adnexal cyst.  POSTOPERATIVE DIAGNOSES:   Right paratubal cyst, right sigmoid colon adhesions.    PROCEDURES:   Laparoscopic bilateral salpingo-oophorectomy with collection of pelvic washings, lysis of adhesions.   SURGEON:  Lenard Galloway, M.D.  ASSISTANT:   Dorothy Spark, M.D.  ANESTHESIA:  General endotracheal, local with 0.25% Marcaine, IV Tylenol.  EBL:  10 cc.   URINE OUTPUT:   300 cc.   IV FLUIDS:   750 cc.   COMPLICATIONS:  None.  INDICATIONS FOR THE PROCEDURE:  The patient is a 69 year old G89P2 Caucasian female who presented with a known right pelvic mass detected on CT scan, done for abdominal pain.  Pelvic ultrasound in the office confirmed a 55 x 49 x 44 cm simple right adnexal mass separate from the ovary.  Her CA125 was 18.3.  The  patient desires surgical care, and a plan is made to proceed now with a laparoscopic bilateral salpingo-oophorectomy with collection of pelvic washings.  Risks, benefits, and alternatives are reviewed with the patient, who wishes to proceed.  FINDINGS:  Examination under anesthesia revealed a small uterus and no adnexal masses.   Laparoscopy demonstrated a normal uterus.  Her uterus was normal.  Her fallopian tubes were consistent with bilateral tubal ligation with partial salpingectomy.  Both ovaries were normal.  There was a 5 cm right paratubal cyst.  The liver, gall bladder, appendix, omentum, and peritoneal surfaces were normal.  There was no evidence of ascites.  The right colon was adherent to the right abdominal wall, and these adhesions were removed.   The ureters were noted to peristalse along the bilateral pelvic sidewalls.   SPECIMENS:  Pelvic washing were sent to pathology as one specimen, and the bilateral tubes and ovaries and right paratubal cyst were sent as a second specimen.    DESCRIPTION OF PROCEDURE:  The patient was reidentified in the preoperative hold area.  She  received TED hose and PAS stockings for DVT prophylaxis.  The patient was transferred to the operating room where she was placed in the dorsal lithotomy position with Allen stirrups.  General endotracheal anesthesia was induced.  The Trendgard was used to support the patient on the OR table.   An examination under anesthesia was performed.  The patient's lower abdomen, vagina and perineum were then sterilely prepped and she was draped.  A Foley catheter was sterilely placed inside the bladder and left to gravity drainage throughout the procedure and was removed at the termination of the procedure. A Hulka tenaculum was placed in the uterus for surgery, and was removed at the end of the case.  Attention was turned to the abdomen where the umbilical region was injected with 0.25% Marcaine and a small incision created.  Penetrating towel clips were used to raise the anterior abdominal wall.  A Veress needle was then used to insufflate the abdomen with CO2 gas after a saline drop test was performed and the fluid flowed freely.   A 5 mm camera port was then placed using the Optiview.  A 10 mm incision was created in the right lower quadrant, and a 5 mm incision was created in the left lower abdomen after the skin was injected locally with 0.25% Marcaine. Respective trocars were then placed under visualization of the laparoscope.      The patient was placed in Trendelenburg position.  An inspection of the abdomen and pelvis was performed. The findings are as  noted above.   Pelvic washings were performed with the Nezhat suction irrigator and sent to pathology.  The right colon adhesions were removed with the Harmonic scalpel.  Hemostasis was good.  There were some congenital adhesions around the sigmoid colon at the pelvic brim, and these were lysed with the Harmonic scalpel to give better access to the left infundibulopelvic ligament.   The left infundibulopelvic ligament was grasped and the Harmonic  scalpel was used to cauterize and cut the pedicle.  Dissection continued through the broad ligament to the level of the uterus.  The proximal fallopian tube and the utero-ovarian ligament were cauterized and cut.  The left tube and ovary were placed in the anterior cul de sac and later sent to pathology with the right tube and ovary.    Attention was turned to the patient's right-hand side at this time.  The same procedure that was performed on the left tube and ovary, was repeated on the right tube and ovary.  The large paratubal cyst remained intact.   The Endocatch bag was placed in the peritoneal cavity, and the bilateral tubes, ovaries, and the paratubal cyst were placed inside the bag and removed and sent to pathology.  The cyst needed to be opened with a scalpel inside the bag to drain the fluid out first.   The pneumoperitoneum was decreased, and hemostasis was good.    The right lower abdominal trocar was removed and the [employer] closure divice was used the place a suture of 0/0 Vicryl to close the fascia.  The laparoscope was removed.  The pneumoperitoneum was released, and the patient received manual breaths to remove any remaining CO2 gas.  The remaining trocars were removed.   The trocar sites were closed with subcuticular sutures of 4/0 Vicryl.  All incisions were then closed with Dermabond.   The Hulka tenaculum was removed from the vagina, and the Foley catheter was removed.    The patient was awakened and extubated, and escorted to the recovery room in stable condition.  There were no complications.  All needle, instrument, and sponge counts were correct.   Lenard Galloway, M.D.

## 2020-05-05 NOTE — Discharge Instructions (Signed)
Mary [name],   I found and removed a right paratubal cyst.  This is what we expected to find.  I also found and removed adhesions around your right large intestine.  It looked like the adhesions could have been a reason for pain on this side.   You did great today!  I will see you in the office in 2 weeks.   Mary Half, MD    Laparoscopy, Care After This sheet gives you information about how to care for yourself after your procedure. Your health care provider may also give you more specific instructions. If you have problems or questions, contact your health care provider. What can I expect after the procedure? After the procedure, it is common to have:  Mild discomfort in the abdomen.  Sore throat. Women who have laparoscopy with pelvic examination may have mild cramping and fluid coming from the vagina for a few days after the procedure. Follow these instructions at home: Medicines  Take over-the-counter and prescription medicines only as told by your health care provider.  If you were prescribed an antibiotic medicine, take it as told by your health care provider. Do not stop taking the antibiotic even if you start to feel better. Driving  Do not drive for 24 hours if you were given a medicine to help you relax (sedative) during your procedure.  Do not drive or use heavy machinery while taking prescription pain medicine. Bathing  Do not take baths, swim, or use a hot tub until your health care provider approves. You may take showers. Incision care   Follow instructions from your health care provider about how to take care of your incisions. Make sure you: ? Wash your hands with soap and water before you change your bandage (dressing). If soap and water are not available, use hand sanitizer. ? Change your dressing as told by your health care provider. ? Leave stitches (sutures), skin glue, or adhesive strips in place. These skin closures may need to stay in place for 2 weeks or  longer. If adhesive strip edges start to loosen and curl up, you may trim the loose edges. Do not remove adhesive strips completely unless your health care provider tells you to do that.  Check your incision areas every day for signs of infection. Check for: ? Redness, swelling, or pain. ? Fluid or blood. ? Warmth. ? Pus or a bad smell. Activity  Return to your normal activities as told by your health care provider. Ask your health care provider what activities are safe for you.  Do not lift anything that is heavier than 10 lb (4.5 kg), or the limit that you are told, until your health care provider says that it is safe. General instructions  To prevent or treat constipation while you are taking prescription pain medicine, your health care provider may recommend that you: ? Drink enough fluid to keep your urine pale yellow. ? Take over-the-counter or prescription medicines. ? Eat foods that are high in fiber, such as fresh fruits and vegetables, whole grains, and beans. ? Limit foods that are high in fat and processed sugars, such as fried and sweet foods.  Do not use any products that contain nicotine or tobacco, such as cigarettes and e-cigarettes. If you need help quitting, ask your health care provider.  Keep all follow-up visits as told by your health care provider. This is important. Contact a health care provider if:  You develop shoulder pain.  You feel lightheaded or faint.  You  are unable to pass gas or have a bowel movement.  You feel nauseous or you vomit.  You develop a rash.  You have redness, swelling, or pain around any incision.  You have fluid or blood coming from any incision.  Any incision feels warm to the touch.  You have pus or a bad smell coming from any incision.  You have a fever or chills. Get help right away if:  You have severe pain.  You have vomiting that does not go away.  You have heavy bleeding from the vagina.  Any incision  opens.  You have trouble breathing.  You have chest pain. Summary  After the procedure, it is common to have mild discomfort in the abdomen and a sore throat.  Check your incision areas every day for signs of infection.  Return to your normal activities as told by your health care provider. Ask your health care provider what activities are safe for you. This information is not intended to replace advice given to you by your health care provider. Make sure you discuss any questions you have with your health care provider. Document Revised: 09/08/2017 Document Reviewed: 03/22/2017 Elsevier Patient Education  Mary [name]. Bilateral Salpingo-Oophorectomy, Care After This sheet gives you information about how to care for yourself after your procedure. Your health care provider may also give you more specific instructions. If you have problems or questions, contact your health care provider. What can I expect after the procedure? After the procedure, it is common to have:  Abdominal pain.  Some occasional vaginal bleeding (spotting).  Tiredness.  Symptoms of menopause, such as hot flashes, night sweats, or mood swings. Follow these instructions at home: Incision care   Keep your incision area and your bandage (dressing) clean and dry.  Follow instructions from your health care provider about how to take care of your incision. Make sure you: ? Wash your hands with soap and water before you change your dressing. If soap and water are not available, use hand sanitizer. ? Change your dressing as told by your health care provider. ? Leave stitches (sutures), staples, skin glue, or adhesive strips in place. These skin closures may need to stay in place for 2 weeks or longer. If adhesive strip edges start to loosen and curl up, you may trim the loose edges. Do not remove adhesive strips completely unless your health care provider tells you to do that.  Check your incision area every  day for signs of infection. Check for: ? Redness, swelling, or pain. ? Fluid or blood. ? Warmth. ? Pus or a bad smell. Activity   Do not drive or use heavy machinery while taking prescription pain medicine.  Do not drive for 24 hours if you received a medicine to help you relax (sedative) during your procedure.  Take frequent, short walks throughout the day. Rest when you get tired. Ask your health care provider what activities are safe for you.  Avoid activity that requires great effort. Also, avoid heavy lifting. Do not lift anything that is heavier than 10 lbs. (4.5 kg), or the limit that your health care provider tells you, until he or she says that it is safe to do so.  Do not douche, use tampons, or have sex until your health care provider approves. General instructions   To prevent or treat constipation while you are taking prescription pain medicine, your health care provider may recommend that you: ? Drink enough fluid to keep your urine clear  or pale yellow. ? Take over-the-counter or prescription medicines. ? Eat foods that are high in fiber, such as fresh fruits and vegetables, whole grains, and beans. ? Limit foods that are high in fat and processed sugars, such as fried and sweet foods.  Take over-the-counter and prescription medicines only as told by your health care provider.  Do not take baths, swim, or use a hot tub until your health care provider approves. Ask your health care provider if you can take showers. You may only be allowed to take sponge baths for bathing.  Wear compression stockings as told by your health care provider. These stockings help to prevent blood clots and reduce swelling in your legs.  Keep all follow-up visits as told by your health care provider. This is important. Contact a health care provider if:  You have pain when you urinate.  You have pus or a bad smelling discharge coming from your vagina.  You have redness, swelling, or pain  around your incision.  You have fluid or blood coming from your incision.  Your incision feels warm to the touch.  You have pus or a bad smell coming from your incision.  You have a fever.  Your incision starts to break open.  You have pain in the abdomen, and it gets worse or does not get better when you take medicine.  You develop a rash.  You develop nausea and vomiting.  You feel lightheaded. Get help right away if:  You develop pain in your chest or leg.  You become short of breath.  You faint.  You have increased bleeding from your vagina. Summary  After the procedure, it is common to have pain, bleeding in the vagina, and symptoms of menopause.  Follow instructions from your health care provider about how to take care of your incision.  Follow instructions from your health care provider about activities and restrictions.  Check your incision every day for signs of infection and report any symptoms to your health care provider. This information is not intended to replace advice given to you by your health care provider. Make sure you discuss any questions you have with your health care provider. Document Revised: 11/30/2018 Document Reviewed: 10/31/2016 Elsevier Patient Education  2020 [employer].

## 2020-05-05 NOTE — H&P (Signed)
Office Visit  04/16/2020 [place] Silva, Everardo All, MD Obstetrics and Gynecology  Adnexal mass Dx  Surgical Consult ; Referred by Jerrol Banana., MD Reason for Visit  Additional Documentation  Vitals:  BP 122/68  Pulse 66  Ht 5\' 6"  (1.676 m)  Wt 83.9 kg  LMP 10/11/1999 (Approximate)  BMI 29.86 kg/m  BSA 1.98 m    More Vitals  Flowsheets:  Anthropometrics,  NEWS,  MEWS Score,  Method of Visit    Encounter Info:  Billing Info,  History,  Allergies,  Detailed Report    All Notes   Progress Notes by Nunzio Cobbs, MD at 04/16/2020 1:00 PM Author: Nunzio Cobbs, MD Author Type: Physician Filed: 04/19/2020  8:14 PM  Note Status: Signed Cosign: Cosign Not Required Encounter Date: 04/16/2020  Editor: Nunzio Cobbs, MD (Physician)      Prior Versions: 1. Nunzio Cobbs, MD (Physician) at 04/16/2020  1:39 PM - Sign when Signing Visit   2. Lowella Fairy, CMA (Certified Medical Assistant) at 04/16/2020  1:04 PM - Sign when Signing Visit     GYNECOLOGY  VISIT   HPI: 69 y.o.   Married  Caucasian  female   G2P2 with Patient's last menstrual period was 10/11/1999 (approximate).   here for surgical consult.   Patient has a large right paratubal cyst that will be removed through laparoscopy.  Size is 5.3 x 5.0 on CT scan.  CA125 18.3. Pelvic ultrasound shows a normal uterus and atrophic ovaries.  No free fluid was seen.   She has more discomfort if she is sitting a lot. On vaginal bleeding.   She has some urinary urgency and frequency.   No health changes.  Denies chest pain or shortness of breath.   She does not tolerate narcotics.    She received her Covid vaccines, last one in March or April.   GYNECOLOGIC HISTORY: Patient's last menstrual period was 10/11/1999 (approximate). Contraception:  PMP Menopausal hormone therapy:  none Last mammogram:  09-04-18  3D/Neg/density B/BiRads1 Last pap smear:  08-13-15 Neg:Neg HR HPV                OB History     Gravida  2   Para  2   Term      Preterm      AB      Living  2      SAB      TAB      Ectopic      Multiple      Live Births                        Patient Active Problem List    Diagnosis Date Noted  . Insomnia, persistent 07/14/2015  . Abnormal liver enzymes 07/14/2015  . HLD (hyperlipidemia) 07/14/2015  . Osteopenia 07/14/2015  . Cyst of ovary 07/14/2015  . Avitaminosis D 07/14/2015  . Chest pain 03/10/2011          Past Medical History:  Diagnosis Date  . Hyperlipidemia    . Preglaucoma    . UTI (lower urinary tract infection)             Past Surgical History:  Procedure Laterality Date  . COLONOSCOPY WITH PROPOFOL N/A 11/19/2018    Procedure: COLONOSCOPY WITH PROPOFOL;  Surgeon: Manya Silvas, MD;  Location: [facility] END[facility];  Service: Endoscopy;  Laterality: N/A;  . TONSILLECTOMY      . TUBAL LIGATION                Current Outpatient Medications  Medication Sig Dispense Refill  . diphenhydrAMINE HCl (SLEEP AID) 50 MG/30ML LIQD Take 15 mLs by mouth daily as needed.      . Latanoprost (XELPROS) 0.005 % EMUL Apply 1 drop to eye daily. In both eyes      . temazepam (RESTORIL) 15 MG capsule Take 1 capsule by mouth at bedtime as needed.        No current facility-administered medications for this visit.      ALLERGIES: Amoxicillin and Hydrocodone-acetaminophen        Family History  Problem Relation Age of Onset  . Heart disease Mother    . Diabetes Mother    . Alzheimer's disease Mother    . Heart attack Father    . Heart disease Father    . Skin cancer Father    . COPD Father    . Hypertension Father    . Hypertension Brother    . Heart disease Brother    . Parkinson's disease Maternal Grandmother    . Breast cancer Neg Hx        Social History         Socioeconomic History  . Marital status: Married      Spouse name: Not  on file  . Number of children: 2  . Years of education: Not on file  . Highest education level: Associate degree: occupational, Hotel manager, or vocational program  Occupational History  . Occupation: retired  Tobacco Use  . Smoking status: Never Smoker  . Smokeless tobacco: Never Used  Vaping Use  . Vaping Use: Never used  Substance and Sexual Activity  . Alcohol use: Yes      Alcohol/week: 7.0 standard drinks      Types: 7 Glasses of wine per week      Comment: 1 drink a day  . Drug use: No  . Sexual activity: Not Currently  Other Topics Concern  . Not on file  Social History Narrative  . Not on file    Social Determinants of Health       Financial Resource Strain:   . Difficulty of Paying Living Expenses:   Food Insecurity:   . Worried About Charity fundraiser in the Last Year:   . Arboriculturist in the Last Year:   Transportation Needs:   . Film/video editor (Medical):   Marland Kitchen Lack of Transportation (Non-Medical):   Physical Activity: Insufficiently Active  . Days of Exercise per Week: 3 days  . Minutes of Exercise per Session: 40 min  Stress:   . Feeling of Stress :   Social Connections:   . Frequency of Communication with Friends and Family:   . Frequency of Social Gatherings with Friends and Family:   . Attends Religious Services:   . Active Member of Clubs or Organizations:   . Attends Archivist Meetings:   Marland Kitchen Marital Status:   Intimate Partner Violence:   . Fear of Current or Ex-Partner:   . Emotionally Abused:   Marland Kitchen Physically Abused:   . Sexually Abused:       Review of Systems  All other systems reviewed and are negative.     PHYSICAL EXAMINATION:     BP 122/68   Pulse 66   Ht 5\' 6"  (1.676 m)  Wt 185 lb (83.9 kg)   LMP 10/11/1999 (Approximate)   BMI 29.86 kg/m     General appearance: alert, cooperative and appears stated age Head: Normocephalic, without obvious abnormality, atraumatic Neck: no adenopathy, supple, symmetrical,  trachea midline and thyroid normal to inspection and palpation Lungs: clear to auscultation bilaterally Heart: regular rate and rhythm Abdomen: soft, non-tender, no masses,  no organomegaly Extremities: extremities normal, atraumatic, no cyanosis or edema Skin: Skin color, texture, turgor normal. No rashes or lesions No abnormal inguinal nodes palpated Neurologic: Grossly normal   Pelvic: External genitalia:  no lesions              Urethra:  normal appearing urethra with no masses, tenderness or lesions              Bartholins and Skenes: normal                 Vagina: normal appearing vagina with normal color and discharge, no lesions              Cervix: no lesions                Bimanual Exam:  Uterus:  normal size, contour, position, consistency, mobility, non-tender              Adnexa: no mass, fullness, tenderness on left.  She has right adnexal fullness, nontender to palpation.        Chaperone was present for exam.   ASSESSMENT   Large right para-ovarian cyst.  Normal CA125.    PLAN   Will proceed with laparoscopic BSO with collection of pelvic washings. Risks, benefits, and alternatives discussed with the patient who wishes to proceed.  Motrin and Tramadol for pain post op.

## 2020-05-05 NOTE — Transfer of Care (Signed)
Immediate Anesthesia Transfer of Care Note  Patient: Mary [name]  Procedure(s) Performed: Procedure(s) (LRB): LAPAROSCOPIC BILATERAL SALPINGO OOPHORECTOMY WITH COLLECTION OF PELVIC WASHINGS AND LYSIS OF ADHEASIONS (Bilateral)  Patient Location: PACU  Anesthesia Type: General  Level of Consciousness: awake, oriented, sedated and patient cooperative  Airway & Oxygen Therapy: Patient Spontanous Breathing and Patient connected to face mask oxygen  Post-op Assessment: Report given to PACU RN and Post -op Vital signs reviewed and stable  Post vital signs: Reviewed and stable  Complications: No apparent anesthesia complications  Last Vitals:  Vitals Value Taken Time  BP 147/92 05/05/20 0856  Temp 36.4 C 05/05/20 0900  Pulse 102 05/05/20 0900  Resp 16 05/05/20 0900  SpO2 99 % 05/05/20 0900  Vitals shown include unvalidated device data.  Last Pain:  Vitals:   05/05/20 0627  TempSrc: Oral  PainSc: 0-No pain      Patients Stated Pain Goal: 5 (05/05/20 9323)

## 2020-05-05 NOTE — Anesthesia Postprocedure Evaluation (Signed)
Anesthesia Post Note  Patient: Mary [name]  Procedure(s) Performed: LAPAROSCOPIC BILATERAL SALPINGO OOPHORECTOMY WITH COLLECTION OF PELVIC WASHINGS AND LYSIS OF ADHEASIONS (Bilateral Abdomen)     Patient location during evaluation: PACU Anesthesia Type: General Level of consciousness: sedated Pain management: pain level controlled Vital Signs Assessment: post-procedure vital signs reviewed and stable Respiratory status: spontaneous breathing and respiratory function stable Cardiovascular status: stable Postop Assessment: no apparent nausea or vomiting Anesthetic complications: no   No complications documented.  Last Vitals:  Vitals:   05/05/20 0930 05/05/20 0945  BP: (!) 137/88 (!) 138/99  Pulse: 68 50  Resp: 12 13  Temp:  (!) 36.3 C  SpO2: 100% 96%    Last Pain:  Vitals:   05/05/20 0945  TempSrc:   PainSc: 0-No pain                 [name] DANIEL

## 2020-05-05 NOTE — Progress Notes (Signed)
Update to History and Physical  No marked change in status since office pre-op visit.   Patient examined.   OK to proceed with surgery. 

## 2020-05-06 ENCOUNTER — Encounter (HOSPITAL_BASED_OUTPATIENT_CLINIC_OR_DEPARTMENT_OTHER): Payer: Self-pay | Admitting: Obstetrics and Gynecology

## 2020-05-06 LAB — SURGICAL PATHOLOGY

## 2020-05-06 LAB — CYTOLOGY - NON PAP

## 2020-05-12 ENCOUNTER — Ambulatory Visit
Admission: RE | Admit: 2020-05-12 | Discharge: 2020-05-12 | Disposition: A | Discharge status: Home / Self Care | Payer: Medicare Other | Source: Ambulatory Visit | Attending: Family Medicine | Admitting: Family Medicine

## 2020-05-12 ENCOUNTER — Other Ambulatory Visit: Payer: Self-pay

## 2020-05-12 DIAGNOSIS — Z1231 Encounter for screening mammogram for malignant neoplasm of breast: Secondary | ICD-10-CM | POA: Insufficient documentation

## 2020-05-19 NOTE — Progress Notes (Signed)
GYNECOLOGY  VISIT   HPI: 69 y.o.   Married  Caucasian  female   G2P2 with Patient's last menstrual period was 10/11/1999 (approximate).   here for 2 weeks status post LAPAROSCOPIC BILATERAL SALPINGO OOPHORECTOMY WITH COLLECTION OF PELVIC WASHINGS AND LYSIS OF ADHEASIONS (Bilateral Abdomen).   Final pathology:  Normal ovaries, bilateral benign paratubal cysts.  Pelvic washings:  No malignant cells.   Her pelvic is improved since removing the adnexal cyst.  GYNECOLOGIC HISTORY: Patient's last menstrual period was 10/11/1999 (approximate). Contraception: PMP Menopausal hormone therapy:  none Last mammogram: 05-12-20 3D/Neg/density C/BiRads1 Last pap smear: 08-13-15 Neg:Neg HR HPV        OB History    Gravida  2   Para  2   Term      Preterm      AB      Living  2     SAB      TAB      Ectopic      Multiple      Live Births                 Patient Active Problem List   Diagnosis Date Noted  . Insomnia, persistent 07/14/2015  . Abnormal liver enzymes 07/14/2015  . HLD (hyperlipidemia) 07/14/2015  . Osteopenia 07/14/2015  . Cyst of ovary 07/14/2015  . Avitaminosis D 07/14/2015  . Chest pain 03/10/2011    Past Medical History:  Diagnosis Date  . Adnexal mass   . Elevated ALT measurement   . History of blood transfusion 1973  . Preglaucoma     Past Surgical History:  Procedure Laterality Date  . COLONOSCOPY WITH PROPOFOL N/A 11/19/2018   Procedure: COLONOSCOPY WITH PROPOFOL;  Surgeon: Manya Silvas, MD;  [facility] ENDOSCOPY;  Service: Endoscopy;  Laterality: N/A;  . LAPAROSCOPIC BILATERAL SALPINGO OOPHERECTOMY Bilateral 05/05/2020   Procedure: LAPAROSCOPIC BILATERAL SALPINGO OOPHORECTOMY WITH COLLECTION OF PELVIC WASHINGS AND LYSIS OF ADHEASIONS;  Surgeon: Nunzio Cobbs, MD;  Location: [facility]  Service: Gynecology;  Laterality: Bilateral;  . TONSILLECTOMY  age 20  . TUBAL LIGATION  1976    Current Outpatient  Medications  Medication Sig Dispense Refill  . diphenhydrAMINE HCl (SLEEP AID) 50 MG/30ML LIQD Take 15 mLs by mouth daily as needed.    Marland Kitchen ibuprofen (ADVIL) 800 MG tablet Take 1 tablet (800 mg total) by mouth every 8 (eight) hours as needed. 30 tablet 0  . Latanoprost (XELPROS) 0.005 % EMUL Apply 1 drop to eye at bedtime. In both eyes     . temazepam (RESTORIL) 15 MG capsule Take 1 capsule by mouth at bedtime as needed.    . traMADol (ULTRAM) 50 MG tablet Take 1 tablet (50 mg total) by mouth every 6 (six) hours as needed. 15 tablet 0   No current facility-administered medications for this visit.     ALLERGIES: Amoxicillin and Hydrocodone-acetaminophen  Family History  Problem Relation Age of Onset  . Heart disease Mother   . Diabetes Mother   . Alzheimer's disease Mother   . Heart attack Father   . Heart disease Father   . Skin cancer Father   . COPD Father   . Hypertension Father   . Hypertension Brother   . Heart disease Brother   . Parkinson's disease Maternal Grandmother   . Breast cancer Neg Hx     Social History   Socioeconomic History  . Marital status: Married    Spouse name: Not  on file  . Number of children: 2  . Years of education: Not on file  . Highest education level: Associate degree: occupational, Hotel manager, or vocational program  Occupational History  . Occupation: retired  Tobacco Use  . Smoking status: Never Smoker  . Smokeless tobacco: Never Used  Vaping Use  . Vaping Use: Never used  Substance and Sexual Activity  . Alcohol use: Yes    Alcohol/week: 7.0 standard drinks    Types: 7 Glasses of wine per week    Comment: 1 drink a day  . Drug use: No  . Sexual activity: Not Currently  Other Topics Concern  . Not on file  Social History Narrative  . Not on file   Social Determinants of Health   Financial Resource Strain:   . Difficulty of Paying Living Expenses:   Food Insecurity:   . Worried About Charity fundraiser in the Last Year:   .  Arboriculturist in the Last Year:   Transportation Needs:   . Film/video editor (Medical):   Marland Kitchen Lack of Transportation (Non-Medical):   Physical Activity: Insufficiently Active  . Days of Exercise per Week: 3 days  . Minutes of Exercise per Session: 40 min  Stress:   . Feeling of Stress :   Social Connections:   . Frequency of Communication with Friends and Family:   . Frequency of Social Gatherings with Friends and Family:   . Attends Religious Services:   . Active Member of Clubs or Organizations:   . Attends Archivist Meetings:   Marland Kitchen Marital Status:   Intimate Partner Violence:   . Fear of Current or Ex-Partner:   . Emotionally Abused:   Marland Kitchen Physically Abused:   . Sexually Abused:     Review of Systems  All other systems reviewed and are negative.   PHYSICAL EXAMINATION:    BP 120/68 (Cuff Size: Large)   Pulse 68   Ht 5\' 6"  (1.676 m)   Wt 185 lb (83.9 kg)   LMP 10/11/1999 (Approximate)   BMI 29.86 kg/m     General appearance: alert, cooperative and appears stated age   Abdomen: incisions intact, abdomen is soft, non-tender, no masses,  no organomegaly   Pelvic: External genitalia:  no lesions              Urethra:  normal appearing urethra with no masses, tenderness or lesions              Bartholins and Skenes: normal                 Vagina: normal appearing vagina with normal color and discharge, no lesions              Cervix: no lesions                Bimanual Exam:  Uterus:  normal size, contour, position, consistency, mobility, non-tender              Adnexa: no mass, fullness, tenderness        Chaperone was present for exam.  ASSESSMENT  Status post laparoscopic BSO, excision of right paratubal cyst, collection of pelvic washings, lysis of adhesions.  Doing well post op.  PLAN  Surgical findings, procedure, and pathology report reviewed. Decreased activity for 4 weeks post op and then gradually increase activity.  FU prn.

## 2020-05-20 ENCOUNTER — Ambulatory Visit (INDEPENDENT_AMBULATORY_CARE_PROVIDER_SITE_OTHER): Payer: Medicare Other | Admitting: Obstetrics and Gynecology

## 2020-05-20 ENCOUNTER — Other Ambulatory Visit: Payer: Self-pay

## 2020-05-20 ENCOUNTER — Encounter: Payer: Self-pay | Admitting: Obstetrics and Gynecology

## 2020-05-20 VITALS — BP 120/68 | HR 68 | Ht 66.0 in | Wt 185.0 lb

## 2020-05-20 DIAGNOSIS — Z9889 Other specified postprocedural states: Secondary | ICD-10-CM

## 2020-07-07 DIAGNOSIS — Z23 Encounter for immunization: Secondary | ICD-10-CM | POA: Diagnosis not present

## 2020-09-14 DIAGNOSIS — H401131 Primary open-angle glaucoma, bilateral, mild stage: Secondary | ICD-10-CM | POA: Diagnosis not present

## 2020-09-23 DIAGNOSIS — H401131 Primary open-angle glaucoma, bilateral, mild stage: Secondary | ICD-10-CM | POA: Diagnosis not present

## 2020-10-07 ENCOUNTER — Telehealth: Payer: Self-pay | Admitting: Family Medicine

## 2020-10-07 NOTE — Telephone Encounter (Signed)
Left message for patient to call back and schedule Medicare Annual Wellness Visit (AWV) in office.   If not able to come in office, please offer to do virtually.   Last AWV 09/17/2019   Please schedule at anytime with [facility] Health Advisor.  If any questions, please contact me at [phone]

## 2020-10-15 DIAGNOSIS — Z872 Personal history of diseases of the skin and subcutaneous tissue: Secondary | ICD-10-CM | POA: Diagnosis not present

## 2020-10-15 DIAGNOSIS — L821 Other seborrheic keratosis: Secondary | ICD-10-CM | POA: Diagnosis not present

## 2020-10-15 DIAGNOSIS — D485 Neoplasm of uncertain behavior of skin: Secondary | ICD-10-CM | POA: Diagnosis not present

## 2020-10-15 DIAGNOSIS — L570 Actinic keratosis: Secondary | ICD-10-CM | POA: Diagnosis not present

## 2020-10-15 DIAGNOSIS — L578 Other skin changes due to chronic exposure to nonionizing radiation: Secondary | ICD-10-CM | POA: Diagnosis not present

## 2020-10-15 DIAGNOSIS — Z86018 Personal history of other benign neoplasm: Secondary | ICD-10-CM | POA: Diagnosis not present

## 2020-11-11 DIAGNOSIS — L570 Actinic keratosis: Secondary | ICD-10-CM | POA: Diagnosis not present

## 2020-11-17 ENCOUNTER — Telehealth: Payer: Self-pay | Admitting: Family Medicine

## 2020-11-17 NOTE — Telephone Encounter (Signed)
Copied from [place] [id number]. Topic: Medicare AWV >> Nov 17, 2020  2:17 PM Cher Nakai R wrote: Reason for CRM:  Left message for patient to call back and schedule Medicare Annual Wellness Visit (AWV) in office.   If not able to come in office, please offer to do virtually or by telephone.   Last AWV 09/17/2019  Please schedule at anytime with [facility] Health Advisor.  If any questions, please contact me at [phone]

## 2020-12-30 NOTE — Telephone Encounter (Signed)
Telephonic AWV scheduled for 01/07/21 @ 1:20 PM.

## 2020-12-30 NOTE — Telephone Encounter (Signed)
Pt is calling to sch AWV

## 2021-01-06 NOTE — Progress Notes (Signed)
Subjective:   Mary [name] is a 70 y.o. female who presents for Medicare Annual (Subsequent) preventive examination.  I connected with Mary [name] today by telephone and verified that I am speaking with the correct person using two identifiers. Location patient: home Location provider: work Persons participating in the virtual visit: patient, provider.   I discussed the limitations, risks, security and privacy concerns of performing an evaluation and management service by telephone and the availability of in person appointments. I also discussed with the patient that there may be a patient responsible charge related to this service. The patient expressed understanding and verbally consented to this telephonic visit.    Interactive audio and video telecommunications were attempted between this provider and patient, however failed, due to patient having technical difficulties OR patient did not have access to video capability.  We continued and completed visit with audio only.   Review of Systems    N/A  Cardiac Risk Factors include: advanced age (>32men, >4 women)     Objective:    There were no vitals filed for this visit. There is no height or weight on file to calculate BMI.  Advanced Directives 01/07/2021 05/05/2020 02/27/2020 09/17/2019 11/19/2018 08/29/2018 01/05/2017  Does Patient Have a Medical Advance Directive? Yes Yes Yes Yes Yes Yes No  Type of Paramedic of [place];Living will [place];Living will [place];Living will [place];Living will [place];Living will [place];Living will -  Does patient want to make changes to medical advance directive? - No - Patient declined - - - - -  Copy of [place] in Chart? No - copy requested No - copy requested Yes - validated most recent copy scanned in chart (See row information) No - copy requested  No - copy requested No - copy requested -    Current Medications (verified) Outpatient Encounter Medications as of 01/07/2021  Medication Sig  . diphenhydrAMINE HCl (SLEEP AID) 50 MG/30ML LIQD Take 15 mLs by mouth daily as needed.  Marland Kitchen ibuprofen (ADVIL) 800 MG tablet Take 1 tablet (800 mg total) by mouth every 8 (eight) hours as needed.  . Latanoprost (XELPROS) 0.005 % EMUL Apply 1 drop to eye at bedtime. In both eyes  . temazepam (RESTORIL) 15 MG capsule Take 1 capsule by mouth at bedtime as needed. (Patient not taking: Reported on 01/07/2021)  . traMADol (ULTRAM) 50 MG tablet Take 1 tablet (50 mg total) by mouth every 6 (six) hours as needed. (Patient not taking: Reported on 01/07/2021)   No facility-administered encounter medications on file as of 01/07/2021.    Allergies (verified) Amoxicillin and Hydrocodone-acetaminophen   History: Past Medical History:  Diagnosis Date  . Adnexal mass   . Elevated ALT measurement   . History of blood transfusion 1973  . Preglaucoma    Past Surgical History:  Procedure Laterality Date  . COLONOSCOPY WITH PROPOFOL N/A 11/19/2018   Procedure: COLONOSCOPY WITH PROPOFOL;  Surgeon: Manya Silvas, MD;  Location: [facility] ENDOSCOPY;  Service: Endoscopy;  Laterality: N/A;  . LAPAROSCOPIC BILATERAL SALPINGO OOPHERECTOMY Bilateral 05/05/2020   Procedure: LAPAROSCOPIC BILATERAL SALPINGO OOPHORECTOMY WITH COLLECTION OF PELVIC WASHINGS AND LYSIS OF ADHEASIONS;  Surgeon: Nunzio Cobbs, MD;  Location: [facility];  Service: Gynecology;  Laterality: Bilateral;  . TONSILLECTOMY  age 2  . TUBAL LIGATION  1976   Family History  Problem Relation Age of Onset  . Heart  disease Mother   . Diabetes Mother   . Alzheimer's disease Mother   . Heart attack Father   . Heart disease Father   . Skin cancer Father   . COPD Father   . Hypertension Father   . Hypertension Brother   . Heart disease Brother   . Parkinson's disease Maternal  Grandmother   . Breast cancer Neg Hx    Social History   Socioeconomic History  . Marital status: Widowed    Spouse name: Not on file  . Number of children: 2  . Years of education: Not on file  . Highest education level: Associate degree: occupational, Hotel manager, or vocational program  Occupational History  . Occupation: retired  Tobacco Use  . Smoking status: Never Smoker  . Smokeless tobacco: Never Used  Vaping Use  . Vaping Use: Never used  Substance and Sexual Activity  . Alcohol use: Yes    Alcohol/week: 7.0 standard drinks    Types: 7 Glasses of wine per week    Comment: 1 drink a day  . Drug use: No  . Sexual activity: Not Currently  Other Topics Concern  . Not on file  Social History Narrative  . Not on file   Social Determinants of Health   Financial Resource Strain: Low Risk   . Difficulty of Paying Living Expenses: Not hard at all  Food Insecurity: No Food Insecurity  . Worried About Charity fundraiser in the Last Year: Never true  . Ran Out of Food in the Last Year: Never true  Transportation Needs: No Transportation Needs  . Lack of Transportation (Medical): No  . Lack of Transportation (Non-Medical): No  Physical Activity: Insufficiently Active  . Days of Exercise per Week: 3 days  . Minutes of Exercise per Session: 30 min  Stress: No Stress Concern Present  . Feeling of Stress : Not at all  Social Connections: Moderately Isolated  . Frequency of Communication with Friends and Family: More than three times a week  . Frequency of Social Gatherings with Friends and Family: More than three times a week  . Attends Religious Services: More than 4 times per year  . Active Member of Clubs or Organizations: No  . Attends Archivist Meetings: Never  . Marital Status: Widowed    Tobacco Counseling Counseling given: Not Answered   Clinical Intake:  Pre-visit preparation completed: Yes  Pain : No/denies pain     Nutritional Risks:  None Diabetes: No  How often do you need to have someone help you when you read instructions, pamphlets, or other written materials from your doctor or pharmacy?: 1 - Never  Diabetic? No  Interpreter Needed?: No  Information entered by :: [facility], LPN   Activities of Daily Living In your present state of health, do you have any difficulty performing the following activities: 01/07/2021 05/05/2020  Hearing? N N  Vision? N N  Difficulty concentrating or making decisions? N N  Walking or climbing stairs? N N  Dressing or bathing? N N  Doing errands, shopping? N -  Preparing Food and eating ? N -  Using the Toilet? N -  In the past six months, have you accidently leaked urine? Y -  Comment Occasionally -  Do you have problems with loss of bowel control? N -  Managing your Medications? N -  Managing your Finances? N -  Housekeeping or managing your Housekeeping? N -  Some recent data might be hidden  Patient Care Team: Jerrol Banana., MD as PCP - General (Unknown Physician Specialty) Jannet Mantis, MD (Dermatology) Nunzio Cobbs, MD as Consulting Physician (Obstetrics and Gynecology) Eulogio Bear, MD as Consulting Physician (Ophthalmology)  Indicate any recent Medical Services you may have received from other than [facility] providers in the past year (date may be approximate).     Assessment:   This is a routine wellness examination for Mary [name].  Hearing/Vision screen No exam data present  Dietary issues and exercise activities discussed: Current Exercise Habits: Home exercise routine, Type of exercise: walking, Time (Minutes): 35, Frequency (Times/Week): 3, Weekly Exercise (Minutes/Week): 105, Intensity: Mild, Exercise limited by: None identified  Goals    . Have 3 meals a day     Recommend eating 3 small meals a day with 2 healthy snacks in between. Avoids high sodium snacks, junk foods and extra snacking.       Depression Screen PHQ  2/9 Scores 01/07/2021 09/17/2019 08/29/2018 12/26/2016 12/26/2016 08/13/2015  PHQ - 2 Score 0 0 2 0 0 0  PHQ- 9 Score - - 7 0 - -    Fall Risk Fall Risk  01/07/2021 09/17/2019 08/29/2018 12/26/2016 08/13/2015  Falls in the past year? 0 0 0 No No  Number falls in past yr: 0 0 - - -  Injury with Fall? 0 0 - - -    FALL RISK PREVENTION PERTAINING TO THE HOME:  Any stairs in or around the home? Yes  If so, are there any without handrails? No  Home free of loose throw rugs in walkways, pet beds, electrical cords, etc? Yes  Adequate lighting in your home to reduce risk of falls? Yes   ASSISTIVE DEVICES UTILIZED TO PREVENT FALLS:  Life alert? No  Use of a cane, walker or w/c? No  Grab bars in the bathroom? No  Shower chair or bench in shower? No  Elevated toilet seat or a handicapped toilet? No    Cognitive Function: Normal cognitive status assessed by observation by this Nurse Health Advisor. No abnormalities found.         Immunizations Immunization History  Administered Date(s) Administered  . Fluad Quad(high Dose 65+) 07/09/2019  . Influenza, High Dose Seasonal PF 07/18/2018  . PFIZER(Purple Top)SARS-COV-2 Vaccination 11/23/2019, 12/14/2019, 06/17/2020  . Pneumococcal Conjugate-13 12/26/2016  . Pneumococcal Polysaccharide-23 07/18/2018  . Td 05/31/2005  . Tdap 02/01/2010  . Zoster 08/13/2015    TDAP status: Up to date  Flu Vaccine status: Up to date  Pneumococcal vaccine status: Up to date  Covid-19 vaccine status: Completed vaccines  Qualifies for Shingles Vaccine? Yes   Zostavax completed No   Shingrix Completed?: No.    Education has been provided regarding the importance of this vaccine. Patient has been advised to call insurance company to determine out of pocket expense if they have not yet received this vaccine. Advised may also receive vaccine at local pharmacy or Health Dept. Verbalized acceptance and understanding.  Screening Tests Health Maintenance  Topic  Date Due  . DEXA SCAN  08/12/2020  . TETANUS/TDAP  01/07/2022 (Originally 02/02/2020)  . COLONOSCOPY (Pts 45-27yrs Insurance coverage will need to be confirmed)  11/19/2021  . MAMMOGRAM  05/12/2022  . INFLUENZA VACCINE  Completed  . COVID-19 Vaccine  Completed  . Hepatitis C Screening  Completed  . PNA vac Low Risk Adult  Completed  . HPV VACCINES  Aged Out    Health Maintenance  Health Maintenance Due  Topic  Date Due  . DEXA SCAN  08/12/2020    Colorectal cancer screening: Type of screening: Colonoscopy. Completed 11/19/18. Repeat every 3 years  Mammogram status: Completed 05/12/20. Repeat every year  Bone Density status: Completed 08/13/15. Results reflect: Bone density results: OSTEOPENIA. Repeat every 5 years.  Lung Cancer Screening: (Low Dose CT Chest recommended if Age 24-80 years, 30 pack-year currently smoking OR have quit w/in 15years.) does not qualify.   Additional Screening:  Hepatitis C Screening: Up to date  Vision Screening: Recommended annual ophthalmology exams for early detection of glaucoma and other disorders of the eye. Is the patient up to date with their annual eye exam?  Yes  Who is the provider or what is the name of the office in which the patient attends annual eye exams? Dr Edison Pace @ [place] If pt is not established with a provider, would they like to be referred to a provider to establish care? No .   Dental Screening: Recommended annual dental exams for proper oral hygiene  Community Resource Referral / Chronic Care Management: CRR required this visit?  No   CCM required this visit?  No      Plan:     I have personally reviewed and noted the following in the patient's chart:   . Medical and social history . Use of alcohol, tobacco or illicit drugs  . Current medications and supplements . Functional ability and status . Nutritional status . Physical activity . Advanced directives . List of other physicians . Hospitalizations, surgeries, and  ER visits in previous 12 months . Vitals . Screenings to include cognitive, depression, and falls . Referrals and appointments  In addition, I have reviewed and discussed with patient certain preventive protocols, quality metrics, and best practice recommendations. A written personalized care plan for preventive services as well as general preventive health recommendations were provided to patient.     Rosabelle Jupin [place], [place]   [id number]   Nurse Notes: None.

## 2021-01-07 ENCOUNTER — Ambulatory Visit (INDEPENDENT_AMBULATORY_CARE_PROVIDER_SITE_OTHER): Payer: PPO

## 2021-01-07 ENCOUNTER — Other Ambulatory Visit: Payer: Self-pay

## 2021-01-07 DIAGNOSIS — Z0000 Encounter for general adult medical examination without abnormal findings: Secondary | ICD-10-CM

## 2021-01-07 DIAGNOSIS — E2839 Other primary ovarian failure: Secondary | ICD-10-CM

## 2021-01-07 NOTE — Patient Instructions (Signed)
Mary [name] , Thank you for taking time to come for your Medicare Wellness Visit. I appreciate your ongoing commitment to your health goals. Please review the following plan we discussed and let me know if I can assist you in the future.   Screening recommendations/referrals: Colonoscopy: Up to date, due 11/2021 Mammogram: Up to date, due 05/2021 Bone Density: Up to date, due 08/2020 Recommended yearly ophthalmology/optometry visit for glaucoma screening and checkup Recommended yearly dental visit for hygiene and checkup  Vaccinations: Influenza vaccine: Done 07/07/20 Pneumococcal vaccine: Completed series Tdap vaccine: Currently due, declined receiving. Shingles vaccine: Shingrix discussed. Please contact your pharmacy for coverage information.     Advanced directives: Currently on file.  Conditions/risks identified: Recommend eating 3 small meals a day with 2 healthy snacks in between. Avoids high sodium snacks, junk foods and extra snacking.   Next appointment: 02/10/21 @ 1:20 PM with Dr Rosanna Randy   Preventive Care 27 Years and Older, Female Preventive care refers to lifestyle choices and visits with your health care provider that can promote health and wellness. What does preventive care include?  A yearly physical exam. This is also called an annual well check.  Dental exams once or twice a year.  Routine eye exams. Ask your health care provider how often you should have your eyes checked.  Personal lifestyle choices, including:  Daily care of your teeth and gums.  Regular physical activity.  Eating a healthy diet.  Avoiding tobacco and drug use.  Limiting alcohol use.  Practicing safe sex.  Taking low-dose aspirin every day.  Taking vitamin and mineral supplements as recommended by your health care provider. What happens during an annual well check? The services and screenings done by your health care provider during your annual well check will depend on your age,  overall health, lifestyle risk factors, and family history of disease. Counseling  Your health care provider may ask you questions about your:  Alcohol use.  Tobacco use.  Drug use.  Emotional well-being.  Home and relationship well-being.  Sexual activity.  Eating habits.  History of falls.  Memory and ability to understand (cognition).  Work and work Statistician.  Reproductive health. Screening  You may have the following tests or measurements:  Height, weight, and BMI.  Blood pressure.  Lipid and cholesterol levels. These may be checked every 5 years, or more frequently if you are over 58 years old.  Skin check.  Lung cancer screening. You may have this screening every year starting at age 99 if you have a 30-pack-year history of smoking and currently smoke or have quit within the past 15 years.  Fecal occult blood test (FOBT) of the stool. You may have this test every year starting at age 50.  Flexible sigmoidoscopy or colonoscopy. You may have a sigmoidoscopy every 5 years or a colonoscopy every 10 years starting at age 55.  Hepatitis C blood test.  Hepatitis B blood test.  Sexually transmitted disease (STD) testing.  Diabetes screening. This is done by checking your blood sugar (glucose) after you have not eaten for a while (fasting). You may have this done every 1-3 years.  Bone density scan. This is done to screen for osteoporosis. You may have this done starting at age 50.  Mammogram. This may be done every 1-2 years. Talk to your health care provider about how often you should have regular mammograms. Talk with your health care provider about your test results, treatment options, and if necessary, the need for more  tests. Vaccines  Your health care provider may recommend certain vaccines, such as:  Influenza vaccine. This is recommended every year.  Tetanus, diphtheria, and acellular pertussis (Tdap, Td) vaccine. You may need a Td booster every 10  years.  Zoster vaccine. You may need this after age 66.  Pneumococcal 13-valent conjugate (PCV13) vaccine. One dose is recommended after age 62.  Pneumococcal polysaccharide (PPSV23) vaccine. One dose is recommended after age 22. Talk to your health care provider about which screenings and vaccines you need and how often you need them. This information is not intended to replace advice given to you by your health care provider. Make sure you discuss any questions you have with your health care provider. Document Released: 10/23/2015 Document Revised: 06/15/2016 Document Reviewed: 07/28/2015 Elsevier Interactive Patient Education  2017 [place] Prevention in the Home Falls can cause injuries. They can happen to people of all ages. There are many things you can do to make your home safe and to help prevent falls. What can I do on the outside of my home?  Regularly fix the edges of walkways and driveways and fix any cracks.  Remove anything that might make you trip as you walk through a door, such as a raised step or threshold.  Trim any bushes or trees on the path to your home.  Use bright outdoor lighting.  Clear any walking paths of anything that might make someone trip, such as rocks or tools.  Regularly check to see if handrails are loose or broken. Make sure that both sides of any steps have handrails.  Any raised decks and porches should have guardrails on the edges.  Have any leaves, snow, or ice cleared regularly.  Use sand or salt on walking paths during winter.  Clean up any spills in your garage right away. This includes oil or grease spills. What can I do in the bathroom?  Use night lights.  Install grab bars by the toilet and in the tub and shower. Do not use towel bars as grab bars.  Use non-skid mats or decals in the tub or shower.  If you need to sit down in the shower, use a plastic, non-slip stool.  Keep the floor dry. Clean up any water that  spills on the floor as soon as it happens.  Remove soap buildup in the tub or shower regularly.  Attach bath mats securely with double-sided non-slip rug tape.  Do not have throw rugs and other things on the floor that can make you trip. What can I do in the bedroom?  Use night lights.  Make sure that you have a light by your bed that is easy to reach.  Do not use any sheets or blankets that are too big for your bed. They should not hang down onto the floor.  Have a firm chair that has side arms. You can use this for support while you get dressed.  Do not have throw rugs and other things on the floor that can make you trip. What can I do in the kitchen?  Clean up any spills right away.  Avoid walking on wet floors.  Keep items that you use a lot in easy-to-reach places.  If you need to reach something above you, use a strong step stool that has a grab bar.  Keep electrical cords out of the way.  Do not use floor polish or wax that makes floors slippery. If you must use wax, use non-skid floor  wax.  Do not have throw rugs and other things on the floor that can make you trip. What can I do with my stairs?  Do not leave any items on the stairs.  Make sure that there are handrails on both sides of the stairs and use them. Fix handrails that are broken or loose. Make sure that handrails are as long as the stairways.  Check any carpeting to make sure that it is firmly attached to the stairs. Fix any carpet that is loose or worn.  Avoid having throw rugs at the top or bottom of the stairs. If you do have throw rugs, attach them to the floor with carpet tape.  Make sure that you have a light switch at the top of the stairs and the bottom of the stairs. If you do not have them, ask someone to add them for you. What else can I do to help prevent falls?  Wear shoes that:  Do not have high heels.  Have rubber bottoms.  Are comfortable and fit you well.  Are closed at the  toe. Do not wear sandals.  If you use a stepladder:  Make sure that it is fully opened. Do not climb a closed stepladder.  Make sure that both sides of the stepladder are locked into place.  Ask someone to hold it for you, if possible.  Clearly mark and make sure that you can see:  Any grab bars or handrails.  First and last steps.  Where the edge of each step is.  Use tools that help you move around (mobility aids) if they are needed. These include:  Canes.  Walkers.  Scooters.  Crutches.  Turn on the lights when you go into a dark area. Replace any light bulbs as soon as they burn out.  Set up your furniture so you have a clear path. Avoid moving your furniture around.  If any of your floors are uneven, fix them.  If there are any pets around you, be aware of where they are.  Review your medicines with your doctor. Some medicines can make you feel dizzy. This can increase your chance of falling. Ask your doctor what other things that you can do to help prevent falls. This information is not intended to replace advice given to you by your health care provider. Make sure you discuss any questions you have with your health care provider. Document Released: 07/23/2009 Document Revised: 03/03/2016 Document Reviewed: 10/31/2014 Elsevier Interactive Patient Education  2017 [employer].

## 2021-01-26 ENCOUNTER — Other Ambulatory Visit: Payer: Medicare Other

## 2021-02-03 ENCOUNTER — Ambulatory Visit
Admission: RE | Admit: 2021-02-03 | Discharge: 2021-02-03 | Disposition: A | Discharge status: Home / Self Care | Payer: PPO | Source: Ambulatory Visit | Attending: Family Medicine | Admitting: Family Medicine

## 2021-02-03 ENCOUNTER — Other Ambulatory Visit: Payer: Self-pay

## 2021-02-03 DIAGNOSIS — E2839 Other primary ovarian failure: Secondary | ICD-10-CM | POA: Insufficient documentation

## 2021-02-03 DIAGNOSIS — M8589 Other specified disorders of bone density and structure, multiple sites: Secondary | ICD-10-CM | POA: Diagnosis not present

## 2021-02-04 ENCOUNTER — Ambulatory Visit: Payer: Self-pay | Admitting: *Deleted

## 2021-02-04 DIAGNOSIS — U071 COVID-19: Secondary | ICD-10-CM

## 2021-02-04 NOTE — Telephone Encounter (Signed)
Per initial encounter,, "Pt tested positive for Covid on Tues nite the 26th, home test and so far just sinus type symptoms and would like to know maybe just some over the counter meds and just touch base for a little information"; contacted pt to discuss her symptoms; the pt tested + Home test on 02/03/21; she is having  headache, nasal congestion, and sneezing; the pt says she has been taking Day/night cold and flu; she had Pfizer vaccine x 3 doses; tier of symptoms reviewed; recommendations made per nurse triage protocol; clarified with pt that if the medication she is taking has acetaminophen or dexatromethorphan she should not take any additional single doses of the medication; she had Pfizer vaccination x 3 doses; the pt sees Dr Berna Bue, [facility], but there is no availability today; pt declined offer for telehealth visit and says she would like to be evaluated by a provider in the office; spoke with Jiles Garter and she requests this information be sent to the office for provider review; pt notified and can be contacted at [phone]; will route per request.   Reason for Disposition . [1] HIGH RISK patient AND [2] influenza exposure within the last 7 days AND [3] ONE OR MORE respiratory symptoms: cough, sore throat, runny or stuffy nose  Answer Assessment - Initial Assessment Questions 1. COVID-19 DIAGNOSIS: "Who made your COVID-19 diagnosis?" "Was it confirmed by a positive lab test or self-test?" If not diagnosed by a doctor (or NP/PA), ask "Are there lots of cases (community spread) where you live?" Note: See public health department website, if unsure.     Home test 2. COVID-19 EXPOSURE: "Was there any known exposure to COVID before the symptoms began?" CDC Definition of close contact: within 6 feet (2 meters) for a total of 15 minutes or more over a 24-hour period.      *No Answer* 3. ONSET: "When did the COVID-19 symptoms start?"      *No Answer* 4. WORST SYMPTOM: "What is  your worst symptom?" (e.g., cough, fever, shortness of breath, muscle aches)     headache 5. COUGH: "Do you have a cough?" If Yes, ask: "How bad is the cough?"      Yes, "little, not bad" 6. FEVER: "Do you have a fever?" If Yes, ask: "What is your temperature, how was it measured, and when did it start?"     no 7. RESPIRATORY STATUS: "Describe your breathing?" (e.g., shortness of breath, wheezing, unable to speak)      no 8. BETTER-SAME-WORSE: "Are you getting better, staying the same or getting worse compared to yesterday?"  If getting worse, ask, "In what way?"     worse 9. HIGH RISK DISEASE: "Do you have any chronic medical problems?" (e.g., asthma, heart or lung disease, weak immune system, obesity, etc.)     obese 10. VACCINE: "Have you had the COVID-19 vaccine?" If Yes, ask: "Which one, how many shots, when did you get it?"       Pfizer x 2 11. BOOSTER: "Have you received your COVID-19 booster?" If Yes, ask: "Which one and when did you get it?"     Pfizer 12. PREGNANCY: "Is there any chance you are pregnant?" "When was your last menstrual period?"       no 13. OTHER SYMPTOMS: "Do you have any other symptoms?"  (e.g., chills, fatigue, headache, loss of smell or taste, muscle pain, sore throat)      Fatigue, headache 14. O2 SATURATION MONITOR:  "Do you use  an oxygen saturation monitor (pulse oximeter) at home?" If Yes, ask "What is your reading (oxygen level) today?" "What is your usual oxygen saturation reading?" (e.g., 95%)      no  Protocols used: CORONAVIRUS (COVID-19) DIAGNOSED OR SUSPECTED-A-AH

## 2021-02-04 NOTE — Telephone Encounter (Signed)
Advised patient. Order for Covid tx therapy referral placed.

## 2021-02-04 NOTE — Telephone Encounter (Signed)
We can offer COVID outpatient therapy referral or urgent care.  I do not know of any other options at this time.

## 2021-02-04 NOTE — Addendum Note (Signed)
Addended by: Wilburt Finlay on: 02/04/2021 11:41 AM   Modules accepted: Orders

## 2021-02-06 ENCOUNTER — Other Ambulatory Visit: Payer: Self-pay | Admitting: Physician Assistant

## 2021-02-06 DIAGNOSIS — U071 COVID-19: Secondary | ICD-10-CM

## 2021-02-06 MED ORDER — MOLNUPIRAVIR EUA 200MG CAPSULE: 4.0000 | ORAL_CAPSULE | Freq: Two times a day (BID) | ORAL | 0 refills | Status: AC

## 2021-02-06 NOTE — Progress Notes (Signed)
Outpatient Oral COVID Treatment Note  I connected with Mary [name] on 02/06/2021/9:49 AM by telephone and verified that I am speaking with the correct person using two identifiers.  I discussed the limitations, risks, security, and privacy concerns of performing an evaluation and management service by telephone and the availability of in person appointments. I also discussed with the patient that there may be a patient responsible charge related to this service. The patient expressed understanding and agreed to proceed.  Patient location: home  Provider location: office  Diagnosis: COVID-19 infection  Purpose of visit: Discussion of potential use of Molnupiravir or Paxlovid, a new treatment for mild to moderate COVID-19 viral infection in non-hospitalized patients.   Subjective: Patient is a 70 y.o. female who has been diagnosed with COVID 19 viral infection.  Their symptoms began on 4/26 with cough and cold sx.    Past Medical History:  Diagnosis Date  . Adnexal mass   . Elevated ALT measurement   . History of blood transfusion 1973  . Preglaucoma     Allergies  Allergen Reactions  . Amoxicillin Rash  . Hydrocodone-Acetaminophen Rash     Current Outpatient Medications:  .  diphenhydrAMINE HCl (SLEEP AID) 50 MG/30ML LIQD, Take 15 mLs by mouth daily as needed., Disp: , Rfl:  .  ibuprofen (ADVIL) 800 MG tablet, Take 1 tablet (800 mg total) by mouth every 8 (eight) hours as needed., Disp: 30 tablet, Rfl: 0 .  Latanoprost (XELPROS) 0.005 % EMUL, Apply 1 drop to eye at bedtime. In both eyes, Disp: , Rfl:  .  temazepam (RESTORIL) 15 MG capsule, Take 1 capsule by mouth at bedtime as needed. (Patient not taking: Reported on 01/07/2021), Disp: , Rfl:  .  traMADol (ULTRAM) 50 MG tablet, Take 1 tablet (50 mg total) by mouth every 6 (six) hours as needed. (Patient not taking: Reported on 01/07/2021), Disp: 15 tablet, Rfl: 0  Objective: Patient sounds okay on phone.  They are in no apparent  distress.  Breathing is non labored.  Mood and behavior are normal.  Laboratory Data:  No results found for this or any previous visit (from the past 2160 hour(s)).   Assessment: 70 y.o. female with mild/moderate COVID 19 viral infection diagnosed on 4/26 at high risk for progression to severe COVID 19.  Plan:  This patient is a 70 y.o. female that meets the following criteria for Emergency Use Authorization of: Molnupiravir  1. Age >18 yr 2. SARS-COV-2 positive test 3. Symptom onset < 5 days 4. Mild-to-moderate COVID disease with high risk for severe progression to hospitalization or death   I have spoken and communicated the following to the patient or parent/caregiver regarding: 1. Molnupiravir is an unapproved drug that is authorized for use under an [employer] pl[employer].  2. There are no adequate, approved, available products for the treatment of COVID-19 in adults who have mild-to-moderate COVID-19 and are at high risk for progressing to severe COVID-19, including hospitalization or death. 3. Other therapeutics are currently authorized. For additional information on all products authorized for treatment or prevention of COVID-19, please see [employer].  4. There are benefits and risks of taking this treatment as outlined in the "Fact Sheet for Patients and Caregivers."  5. "Fact Sheet for Patients and Caregivers" was reviewed with patient. A hard copy will be provided to patient from pharmacy prior to the patient receiving treatment. 6. Patients should continue to self-isolate and use infection control measures (e.g., wear mask, isolate, social distance, avoid  sharing personal items, clean and disinfect "high touch" surfaces, and frequent handwashing) according to CDC guidelines.  7. The patient or parent/caregiver has the option to accept or refuse treatment. 8. [place] has established a pregnancy surveillance program. 9. Females of childbearing potential should use a reliable method of contraception correctly and consistently, as applicable, for the duration of treatment and for 4 days after the last dose of Molnupiravir. 57. Males of reproductive potential who are sexually active with females of childbearing potential should use a reliable method of contraception correctly and consistently during treatment and for at least 3 months after the last dose. 11. Pregnancy status and risk was assessed. Patient verbalized understanding of precautions.   After reviewing above information with the patient, the patient agrees to receive molnupiravir. No recent labs or would have rx'd paxlovid.    Follow up instructions:    . Take prescription BID x 5 days as directed . Reach out to pharmacist for counseling on medication if desired . For concerns regarding further COVID symptoms please follow up with your PCP or urgent care . For urgent or life-threatening issues, seek care at your local emergency department  The patient was provided an opportunity to ask questions, and all were answered. The patient agreed with the plan and demonstrated an understanding of the instructions.   Script sent to [facility]  and opted to pick up RX.  The patient was advised to call their PCP or seek an in-person evaluation if the symptoms worsen or if the condition fails to improve as anticipated.   I provided 10 minutes of non face-to-face telephone visit time during this encounter, and > 50% was spent counseling as documented under my assessment & plan.  Angelena Form, PA-C 02/06/2021 /9:49 AM

## 2021-02-10 ENCOUNTER — Ambulatory Visit: Payer: PPO | Admitting: Family Medicine

## 2021-02-10 NOTE — Progress Notes (Deleted)
      Established patient visit   Patient: Mary [name]   DOB: 11-Nov-1950   70 y.o. Female  MRN: [id number] Visit Date: 02/10/2021  Today's healthcare provider: Wilhemena Durie, MD   No chief complaint on file.  Subjective    HPI  Lipid/Cholesterol, follow-up  Last Lipid Panel: Lab Results  Component Value Date   CHOL 181 09/19/2018   LDLCALC 108 (H) 09/19/2018   HDL 51 09/19/2018   TRIG 108 09/19/2018    She was last seen for this 09/19/2018.  Management since that visit includes; labs checked showing-okay.  She reports {excellent/good/fair/poor:19665} compliance with treatment. She {is/is not:9024} having side effects. {document side effects if present:1} She is following a {diet:21022986} diet. Current exercise: {exercise ZJQBH:41937}  Last metabolic panel Lab Results  Component Value Date   GLUCOSE 96 05/01/2020   NA 140 05/01/2020   K 4.8 05/01/2020   BUN 17 05/01/2020   CREATININE 0.81 05/01/2020   GFRNONAA >60 05/01/2020   GFRAA >60 05/01/2020   CALCIUM 9.3 05/01/2020   AST 33 05/01/2020   ALT 46 (H) 05/01/2020   The 10-year ASCVD risk score Mikey Bussing DC Jr., et al., 2013) is: 7.4%  --------------------------------------------------------------------------------------------------- Follow up for Insomnia  The patient was last seen for this 09/17/2019. Changes made at last visit include; on temazepam.  She reports {excellent/good/fair/poor:19665} compliance with treatment. She feels that condition is {improved/worse/unchanged:3041574}. She {is/is not:21021397} having side effects. ***  -----------------------------------------------------------------------------------------    {Show patient history (optional):23778::" "}   Medications: Outpatient Medications Prior to Visit  Medication Sig  . diphenhydrAMINE HCl (SLEEP AID) 50 MG/30ML LIQD Take 15 mLs by mouth daily as needed.  Marland Kitchen ibuprofen (ADVIL) 800 MG tablet Take 1 tablet (800 mg total) by  mouth every 8 (eight) hours as needed.  . Latanoprost (XELPROS) 0.005 % EMUL Apply 1 drop to eye at bedtime. In both eyes  . molnupiravir EUA 200 mg CAPS Take 4 capsules (800 mg total) by mouth 2 (two) times daily for 5 days.  . temazepam (RESTORIL) 15 MG capsule Take 1 capsule by mouth at bedtime as needed. (Patient not taking: Reported on 01/07/2021)  . traMADol (ULTRAM) 50 MG tablet Take 1 tablet (50 mg total) by mouth every 6 (six) hours as needed. (Patient not taking: Reported on 01/07/2021)   No facility-administered medications prior to visit.    Review of Systems  Constitutional: Negative for appetite change, chills, fatigue and fever.  Respiratory: Negative for chest tightness and shortness of breath.   Cardiovascular: Negative for chest pain and palpitations.  Gastrointestinal: Negative for abdominal pain, nausea and vomiting.  Neurological: Negative for dizziness and weakness.    {Labs  Heme  Chem  Endocrine  Serology  Results Review (optional):23779::" "}   Objective    LMP 10/11/1999 (Approximate)  {Show previous vital signs (optional):23777::" "}   Physical Exam  ***  No results found for any visits on 02/10/21.  Assessment & Plan     ***  No follow-ups on file.      {provider attestation***:1}   Wilhemena Durie, MD  [facility] [phone] (phone) [phone] (fax)  [place]

## 2021-03-25 ENCOUNTER — Ambulatory Visit (INDEPENDENT_AMBULATORY_CARE_PROVIDER_SITE_OTHER): Payer: PPO | Admitting: Family Medicine

## 2021-03-25 ENCOUNTER — Other Ambulatory Visit: Payer: Self-pay

## 2021-03-25 VITALS — BP 111/59 | HR 71 | Resp 16 | Wt 186.8 lb

## 2021-03-25 DIAGNOSIS — E559 Vitamin D deficiency, unspecified: Secondary | ICD-10-CM | POA: Diagnosis not present

## 2021-03-25 DIAGNOSIS — R748 Abnormal levels of other serum enzymes: Secondary | ICD-10-CM | POA: Diagnosis not present

## 2021-03-25 DIAGNOSIS — E7849 Other hyperlipidemia: Secondary | ICD-10-CM | POA: Diagnosis not present

## 2021-03-25 NOTE — Progress Notes (Signed)
Established patient visit   Patient: Mary [name]   DOB: [birth year]   70 y.o. Female  MRN: [id number] Visit Date: 03/25/2021  Today's healthcare provider: Wilhemena Durie, MD   No chief complaint on file.  Subjective    HPI  Patient is doing fairly well.  She is now widowed adjusting to life.  She has done well since her hysterectomy last year.  Pathology was negative for the adnexal mass. Lipid/Cholesterol, Follow-up  Last lipid panel Other pertinent labs  Lab Results  Component Value Date   CHOL 181 09/19/2018   HDL 51 09/19/2018   LDLCALC 108 (H) 09/19/2018   TRIG 108 09/19/2018   CHOLHDL 3.5 09/19/2018   Lab Results  Component Value Date   ALT 46 (H) 05/01/2020   AST 33 05/01/2020   PLT 285 05/01/2020   TSH 1.590 09/17/2019     She was last seen for this 1 years ago.  Management since that visit includes none.  Symptoms: No chest pain No chest pressure/discomfort  No dyspnea No lower extremity edema  No numbness or tingling of extremity No orthopnea  No palpitations No paroxysmal nocturnal dyspnea  No speech difficulty No syncope   Current diet:  regular diet  Current exercise: gardening  The 10-year ASCVD risk score Mikey Bussing DC Jr., et al., 2013) is: 7.2%  ---------------------------------------------------------------------------------------------------   Patient Active Problem List   Diagnosis Date Noted   Insomnia, persistent 07/14/2015   Abnormal liver enzymes 07/14/2015   HLD (hyperlipidemia) 07/14/2015   Osteopenia 07/14/2015   Cyst of ovary 07/14/2015   Avitaminosis D 07/14/2015   Chest pain 03/10/2011   Past Medical History:  Diagnosis Date   Adnexal mass    Elevated ALT measurement    History of blood transfusion 1973   Preglaucoma    Allergies  Allergen Reactions   Amoxicillin Rash   Hydrocodone-Acetaminophen Rash       Medications: Outpatient Medications Prior to Visit  Medication Sig Note   Latanoprost (XELPROS) 0.005  % EMUL Apply 1 drop to eye at bedtime. In both eyes    diphenhydrAMINE HCl (SLEEP AID) 50 MG/30ML LIQD Take 15 mLs by mouth daily as needed. (Patient not taking: Reported on 03/25/2021) 03/25/2021: PRN once a week   temazepam (RESTORIL) 15 MG capsule Take 1 capsule by mouth at bedtime as needed. (Patient not taking: No sig reported)    [DISCONTINUED] ibuprofen (ADVIL) 800 MG tablet Take 1 tablet (800 mg total) by mouth every 8 (eight) hours as needed.    [DISCONTINUED] traMADol (ULTRAM) 50 MG tablet Take 1 tablet (50 mg total) by mouth every 6 (six) hours as needed. (Patient not taking: Reported on 01/07/2021)    No facility-administered medications prior to visit.    Review of Systems  Last CBC Lab Results  Component Value Date   WBC 5.1 05/01/2020   HGB 13.7 05/01/2020   HCT 41.8 05/01/2020   MCV 92.9 05/01/2020   MCH 30.4 05/01/2020   RDW 13.3 05/01/2020   PLT 285 45/80/9983   Last metabolic panel Lab Results  Component Value Date   GLUCOSE 96 05/01/2020   NA 140 05/01/2020   K 4.8 05/01/2020   CL 104 05/01/2020   CO2 27 05/01/2020   BUN 17 05/01/2020   CREATININE 0.81 05/01/2020   GFRNONAA >60 05/01/2020   GFRAA >60 05/01/2020   CALCIUM 9.3 05/01/2020   PROT 7.3 05/01/2020   ALBUMIN 4.6 05/01/2020   LABGLOB 2.3 09/17/2019  AGRATIO 2.0 09/17/2019   BILITOT 0.8 05/01/2020   ALKPHOS 51 05/01/2020   AST 33 05/01/2020   ALT 46 (H) 05/01/2020   ANIONGAP 9 05/01/2020   Last lipids Lab Results  Component Value Date   CHOL 181 09/19/2018   HDL 51 09/19/2018   LDLCALC 108 (H) 09/19/2018   TRIG 108 09/19/2018   CHOLHDL 3.5 09/19/2018   Last hemoglobin A1c Lab Results  Component Value Date   HGBA1C 5.5 12/27/2012   Last thyroid functions Lab Results  Component Value Date   TSH 1.590 09/17/2019       Objective    BP (!) 111/59   Pulse 71   Resp 16   Wt 186 lb 12.8 oz (84.7 kg)   LMP 10/11/1999 (Approximate)   SpO2 96%   BMI 30.15 kg/m      Physical  Exam Vitals reviewed.  Constitutional:      Appearance: Normal appearance.  HENT:     Head: Normocephalic and atraumatic.     Right Ear: External ear normal.     Left Ear: External ear normal.  Eyes:     Conjunctiva/sclera: Conjunctivae normal.  Cardiovascular:     Rate and Rhythm: Normal rate and regular rhythm.     Pulses: Normal pulses.     Heart sounds: Normal heart sounds.  Pulmonary:     Breath sounds: Normal breath sounds.  Abdominal:     Palpations: Abdomen is soft.  Musculoskeletal:     Right lower leg: No edema.     Left lower leg: No edema.  Skin:    General: Skin is warm and dry.  Neurological:     General: No focal deficit present.     Mental Status: She is alert and oriented to person, place, and time. Mental status is at baseline.  Psychiatric:        Mood and Affect: Mood normal.        Behavior: Behavior normal.        Thought Content: Thought content normal.        Judgment: Judgment normal.      No results found for any visits on 03/25/21.  Assessment & Plan     1. Other hyperlipidemia Follow-up lipid profile.  Treat as appropriate - Lipid panel - TSH  2. Abnormal liver enzymes Probable fatty liver.  Very minimal alcohol consumption. - CBC with Differential/Platelet - Comprehensive metabolic panel  3. Avitaminosis D BMD when appropriate.  CPE early next year   No follow-ups on file.      I, Wilhemena Durie, MD, have reviewed all documentation for this visit. The documentation on 04/01/21 for the exam, diagnosis, procedures, and orders are all accurate and complete.    Jahmel Flannagan Cranford Mon, MD  [facility] [phone] (phone) [phone] (fax)  [place]

## 2021-03-25 NOTE — Patient Instructions (Signed)
Preventing High Cholesterol Cholesterol is a white, waxy substance similar to fat that the human body needs to help build cells. The liver makes all the cholesterol that a person's body needs. Having high cholesterol (hypercholesterolemia) increases your risk for heart disease and stroke. Extra or excess cholesterolcomes from the food that you eat. High cholesterol can often be prevented with diet and lifestyle changes. If you already have high cholesterol, you can control it with diet, lifestyle changes,and medicines. How can high cholesterol affect me? If you have high cholesterol, fatty deposits (plaques) may build up on the walls of your blood vessels. The blood vessels that carry blood away from your heart are called arteries. Plaques make the arteries narrower and stiffer. This in turn can: Restrict or block blood flow and cause blood clots to form. Increase your risk for heart attack and stroke. What can increase my risk for high cholesterol? This condition is more likely to develop in people who: Eat foods that are high in saturated fat or cholesterol. Saturated fat is mostly found in foods that come from animal sources. Are overweight. Are not getting enough exercise. Have a family history of high cholesterol (familial hypercholesterolemia). What actions can I take to prevent this? Nutrition  Eat less saturated fat. Avoid trans fats (partially hydrogenated oils). These are often found in margarine and in some baked goods, fried foods, and snacks bought in packages. Avoid precooked or cured meat, such as bacon, sausages, or meat loaves. Avoid foods and drinks that have added sugars. Eat more fruits, vegetables, and whole grains. Choose healthy sources of protein, such as fish, poultry, lean cuts of red meat, beans, peas, lentils, and nuts. Choose healthy sources of fat, such as: Nuts. Vegetable oils, especially olive oil. Fish that have healthy fats, such as omega-3 fatty acids.  These fish include mackerel or salmon.  Lifestyle Lose weight if you are overweight. Maintaining a healthy body mass index (BMI) can help prevent or control high cholesterol. It can also lower your risk for diabetes and high blood pressure. Ask your health care provider to help you with a diet and exercise plan to lose weight safely. Do not use any products that contain nicotine or tobacco, such as cigarettes, e-cigarettes, and chewing tobacco. If you need help quitting, ask your health care provider. Alcohol use Do not drink alcohol if: Your health care provider tells you not to drink. You are pregnant, may be pregnant, or are planning to become pregnant. If you drink alcohol: Limit how much you use to: 0-1 drink a day for women. 0-2 drinks a day for men. Be aware of how much alcohol is in your drink. In the [place] one drink equals one 12 oz bottle of beer (355 mL), one 5 oz glass of wine (148 mL), or one 1 oz glass of hard liquor (44 mL). Activity  Get enough exercise. Do exercises as told by your health care provider. Each week, do at least 150 minutes of exercise that takes a medium level of effort (moderate-intensity exercise). This kind of exercise: Makes your heart beat faster while allowing you to still be able to talk. Can be done in short sessions several times a day or longer sessions a few times a week. For example, on 5 days each week, you could walk fast or ride your bike 3 times a day for 10 minutes each time.  Medicines Your health care provider may recommend medicines to help lower cholesterol. This may be a medicine to lower  the amount of cholesterol that your liver makes. You may need medicine if: Diet and lifestyle changes have not lowered your cholesterol enough. You have high cholesterol and other risk factors for heart disease or stroke. Take over-the-counter and prescription medicines only as told by your health care provider. General information Manage your risk  factors for high cholesterol. Talk with your health care provider about all your risk factors and how to lower your risk. Manage other conditions that you have, such as diabetes or high blood pressure (hypertension). Have blood tests to check your cholesterol levels at regular points in time as told by your health care provider. Keep all follow-up visits as told by your health care provider. This is important. Where to find more information American Heart Association: www.heart.org National Heart, Lung, and Blood Institute: https://wilson-eaton.com/ Summary High cholesterol increases your risk for heart disease and stroke. By keeping your cholesterol level low, you can reduce your risk for these conditions. High cholesterol can often be prevented with diet and lifestyle changes. Work with your health care provider to manage your risk factors, and have your blood tested regularly. This information is not intended to replace advice given to you by your health care provider. Make sure you discuss any questions you have with your healthcare provider. Document Revised: 07/09/2019 Document Reviewed: 07/09/2019 Elsevier Patient Education  [place].

## 2021-03-26 LAB — LIPID PANEL
Chol/HDL Ratio: 4 ratio (ref 0.0–4.4)
Cholesterol, Total: 197 mg/dL (ref 100–199)
HDL: 49 mg/dL (ref >39)
LDL Chol Calc (NIH): 111 mg/dL — ABNORMAL HIGH (ref 0–99)
Triglycerides: 213 mg/dL — ABNORMAL HIGH (ref 0–149)
VLDL Cholesterol Cal: 37 mg/dL (ref 5–40)

## 2021-03-26 LAB — CBC WITH DIFFERENTIAL/PLATELET
Basophils Absolute: 0 10*3/uL (ref 0.0–0.2)
Basos: 1 %
EOS (ABSOLUTE): 0.1 10*3/uL (ref 0.0–0.4)
Eos: 2 %
Hematocrit: 41.2 % (ref 34.0–46.6)
Hemoglobin: 13.9 g/dL (ref 11.1–15.9)
Immature Grans (Abs): 0 10*3/uL (ref 0.0–0.1)
Immature Granulocytes: 0 %
Lymphocytes Absolute: 1.9 10*3/uL (ref 0.7–3.1)
Lymphs: 34 %
MCH: 31 pg (ref 26.6–33.0)
MCHC: 33.7 g/dL (ref 31.5–35.7)
MCV: 92 fL (ref 79–97)
Monocytes Absolute: 0.5 10*3/uL (ref 0.1–0.9)
Monocytes: 8 %
Neutrophils Absolute: 3.2 10*3/uL (ref 1.4–7.0)
Neutrophils: 55 %
Platelets: 294 10*3/uL (ref 150–450)
RBC: 4.49 x10E6/uL (ref 3.77–5.28)
RDW: 13.1 % (ref 11.7–15.4)
WBC: 5.7 10*3/uL (ref 3.4–10.8)

## 2021-03-26 LAB — TSH: TSH: 1.23 u[IU]/mL (ref 0.450–4.500)

## 2021-03-26 LAB — COMPREHENSIVE METABOLIC PANEL
ALT: 50 IU/L — ABNORMAL HIGH (ref 0–32)
AST: 38 IU/L (ref 0–40)
Albumin/Globulin Ratio: 2.1 (ref 1.2–2.2)
Albumin: 4.7 g/dL (ref 3.8–4.8)
Alkaline Phosphatase: 66 IU/L (ref 44–121)
BUN/Creatinine Ratio: 18 (ref 12–28)
BUN: 17 mg/dL (ref 8–27)
Bilirubin Total: 0.4 mg/dL (ref 0.0–1.2)
CO2: 24 mmol/L (ref 20–29)
Calcium: 9.6 mg/dL (ref 8.7–10.3)
Chloride: 102 mmol/L (ref 96–106)
Creatinine, Ser: 0.95 mg/dL (ref 0.57–1.00)
Globulin, Total: 2.2 g/dL (ref 1.5–4.5)
Glucose: 101 mg/dL — ABNORMAL HIGH (ref 65–99)
Potassium: 4.8 mmol/L (ref 3.5–5.2)
Sodium: 139 mmol/L (ref 134–144)
Total Protein: 6.9 g/dL (ref 6.0–8.5)
eGFR: 64 mL/min/{1.73_m2} (ref >59)

## 2021-04-22 DIAGNOSIS — H401131 Primary open-angle glaucoma, bilateral, mild stage: Secondary | ICD-10-CM | POA: Diagnosis not present

## 2021-05-16 IMAGING — MG DIGITAL SCREENING BILAT W/ TOMO W/ CAD
8 series · 8 of 24 positions shown · non-contrast
Comparison: Previous exam(s).

CLINICAL DATA: Screening.

EXAM:
DIGITAL SCREENING BILATERAL MAMMOGRAM WITH TOMO AND CAD

[L CC synth-2D]
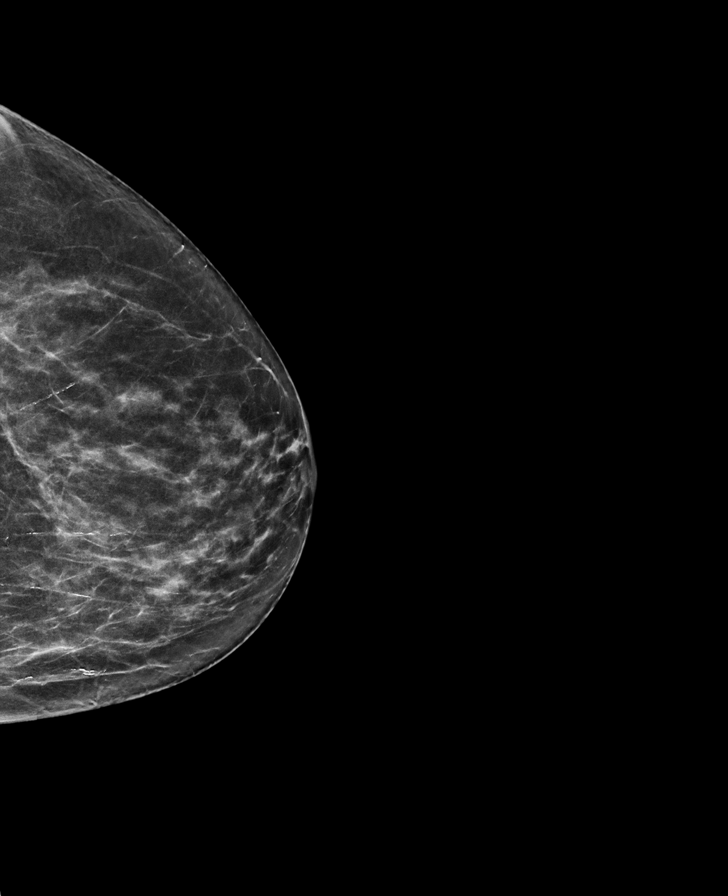

[R MLO synth-2D]
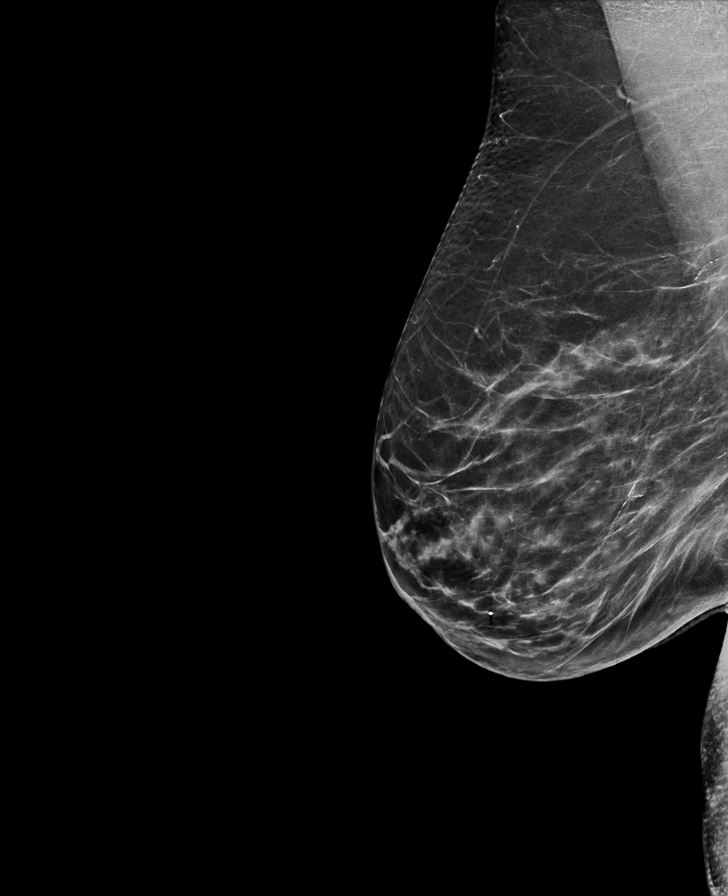

[L MLO synth-2D]
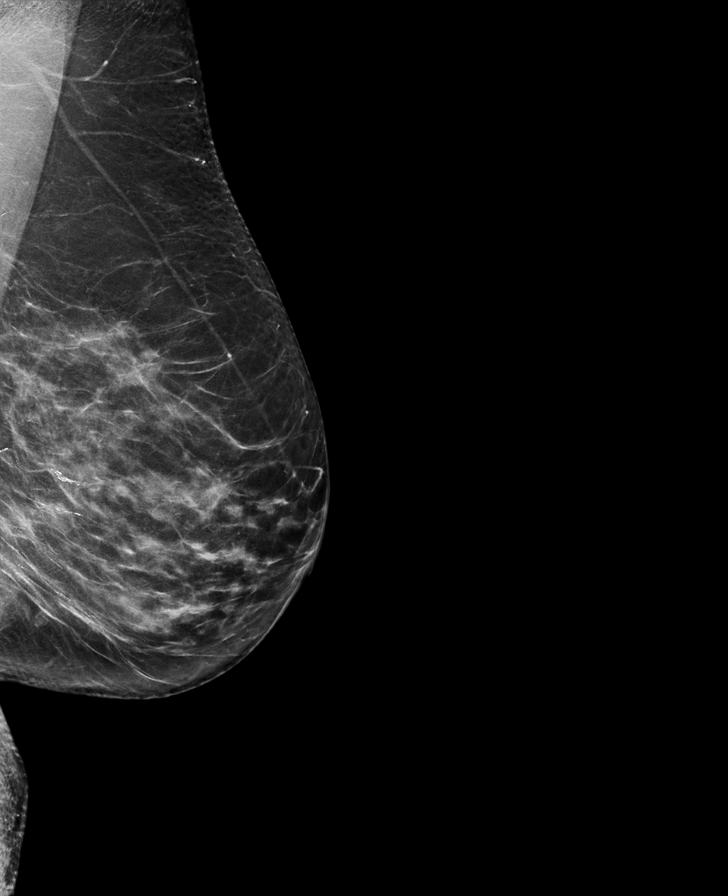

[R CC synth-2D]
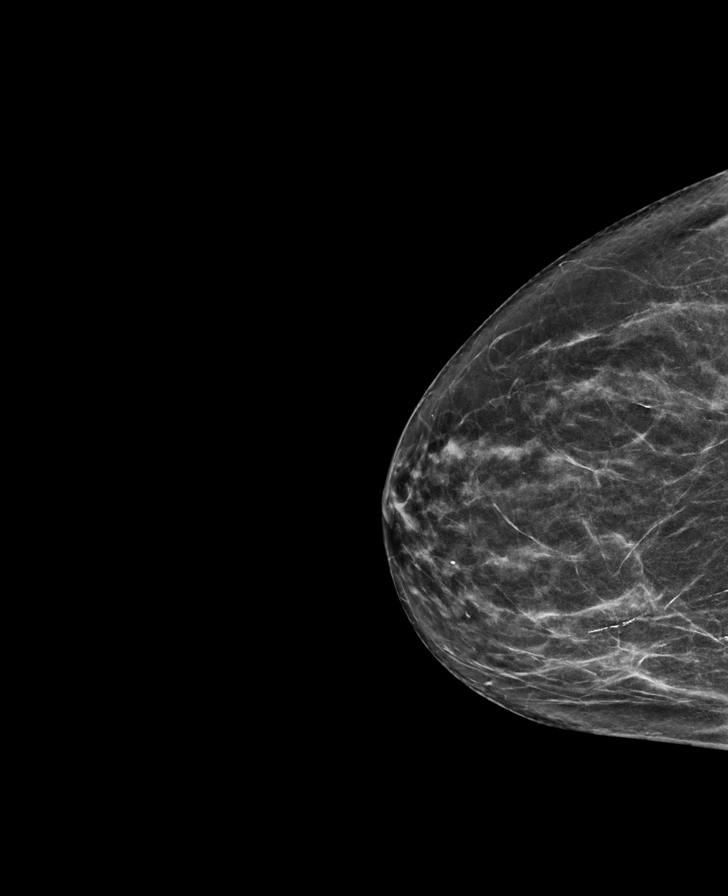

[L MLO tomo · tomo slice 37/72.0]
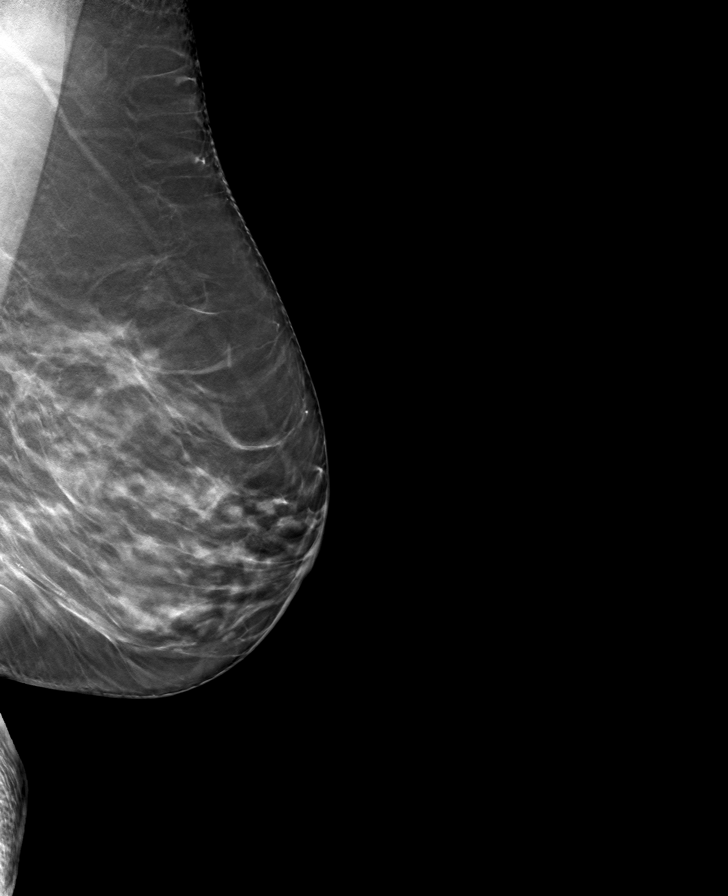

[R MLO tomo · tomo slice 36/71.0]
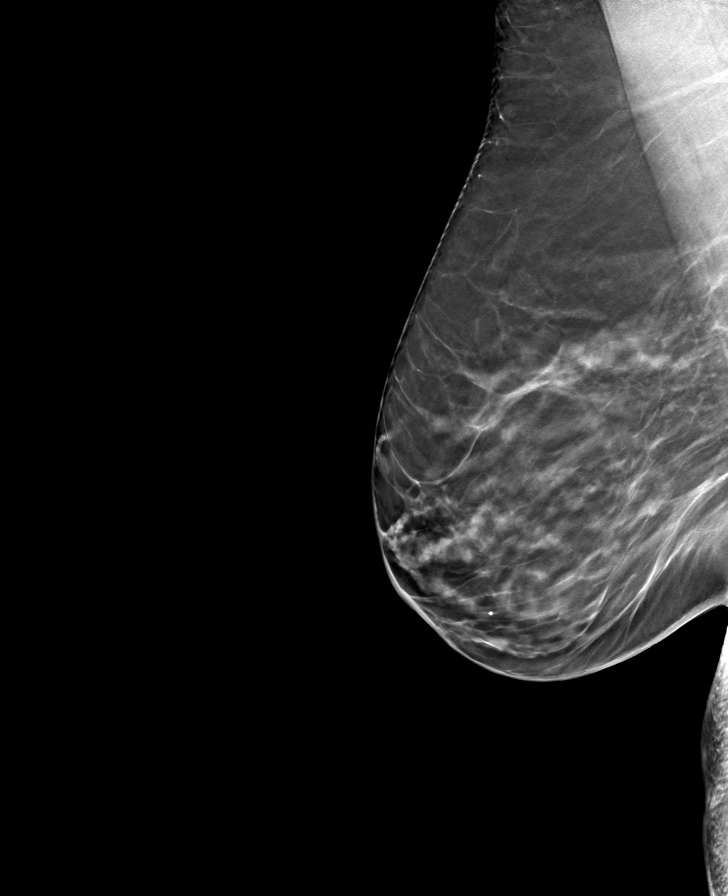

[L CC tomo · tomo slice 33/64.0]
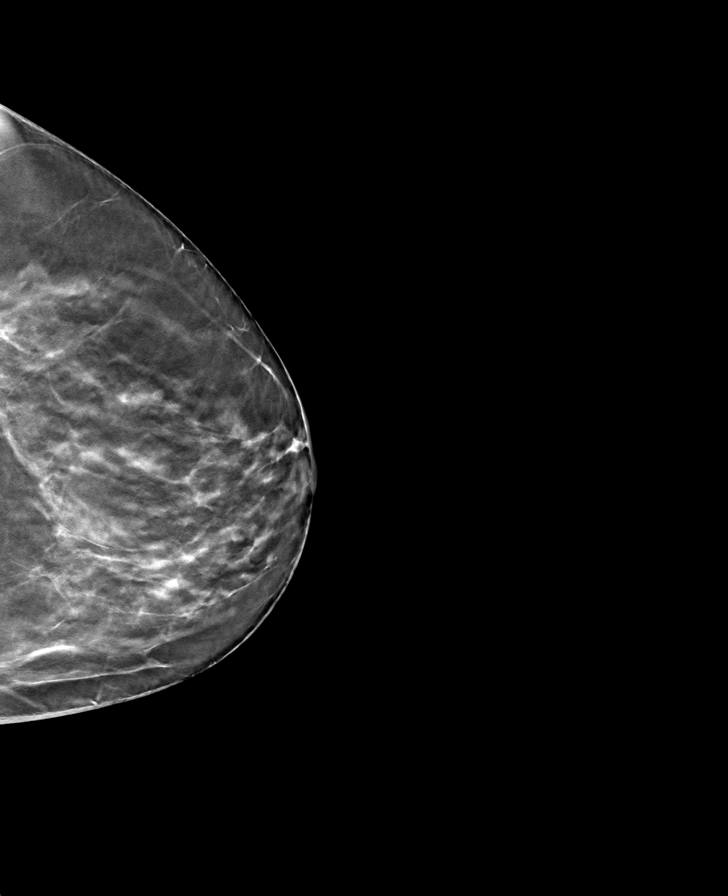

[R CC tomo · tomo slice 33/64.0]
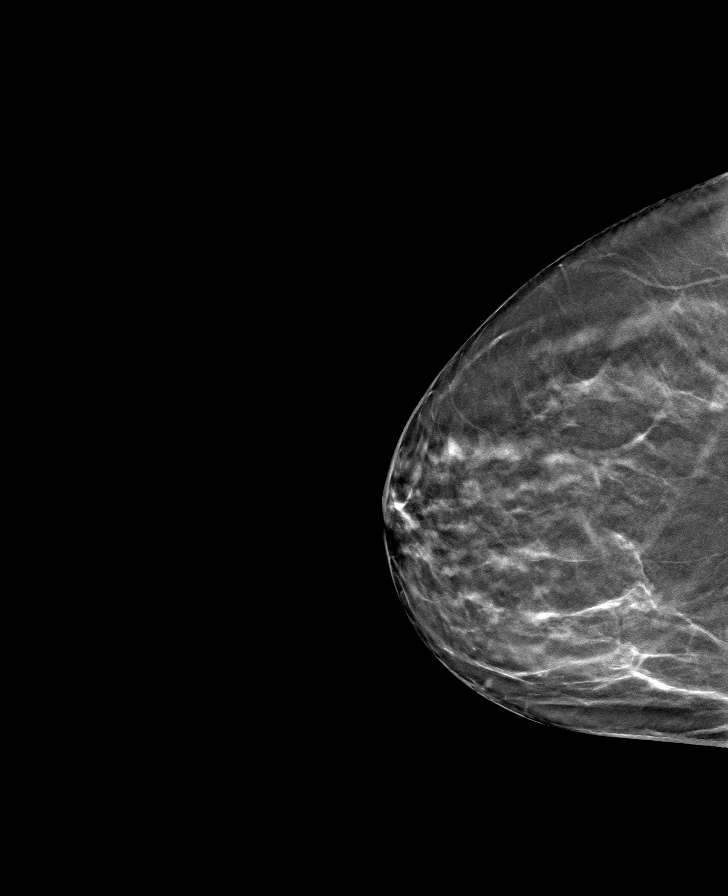

[8 of 24 positions shown; findings below may reference images not displayed]

ACR Breast Density Category c: The breast tissue is heterogeneously
dense, which may obscure small masses.
FINDINGS: There are no findings suspicious for malignancy. Images were
processed with CAD.
IMPRESSION: No mammographic evidence of malignancy. A result letter of this
screening mammogram will be mailed directly to the patient.

RECOMMENDATION:
Screening mammogram in one year. (Code:[id number]-[id number])

BI-RADS CATEGORY  1: Negative.

## 2021-10-13 ENCOUNTER — Encounter: Payer: Self-pay | Admitting: Family Medicine

## 2021-10-15 ENCOUNTER — Ambulatory Visit (INDEPENDENT_AMBULATORY_CARE_PROVIDER_SITE_OTHER): Payer: PPO | Admitting: Physician Assistant

## 2021-10-15 ENCOUNTER — Encounter: Payer: Self-pay | Admitting: Physician Assistant

## 2021-10-15 ENCOUNTER — Other Ambulatory Visit: Payer: Self-pay

## 2021-10-15 VITALS — BP 144/68 | HR 65 | Temp 98.1°F | Ht 65.0 in | Wt 190.0 lb

## 2021-10-15 DIAGNOSIS — M8589 Other specified disorders of bone density and structure, multiple sites: Secondary | ICD-10-CM | POA: Diagnosis not present

## 2021-10-15 DIAGNOSIS — R739 Hyperglycemia, unspecified: Secondary | ICD-10-CM | POA: Diagnosis not present

## 2021-10-15 DIAGNOSIS — E785 Hyperlipidemia, unspecified: Secondary | ICD-10-CM | POA: Diagnosis not present

## 2021-10-15 DIAGNOSIS — R030 Elevated blood-pressure reading, without diagnosis of hypertension: Secondary | ICD-10-CM

## 2021-10-15 DIAGNOSIS — Z0000 Encounter for general adult medical examination without abnormal findings: Secondary | ICD-10-CM | POA: Diagnosis not present

## 2021-10-15 NOTE — Progress Notes (Signed)
Complete physical exam   Patient: Mary [name]   DOB: 09/20/1951   71 y.o. Female  MRN: [id number] Visit Date: 10/15/2021  Today's healthcare provider: [name], PA-C   Cc. CPE  Subjective    Mary [name] is a 71 y.o. female who presents today for a complete physical exam. She has no other concerns today.   Past Medical History:  Diagnosis Date   Adnexal mass    Elevated ALT measurement    History of blood transfusion 1973   Preglaucoma    Past Surgical History:  Procedure Laterality Date   COLONOSCOPY WITH PROPOFOL N/A 11/19/2018   Procedure: COLONOSCOPY WITH PROPOFOL;  Surgeon: Manya Silvas, MD;  [facility] ENDOSCOPY;  Service: Endoscopy;  Laterality: N/A;   LAPAROSCOPIC BILATERAL SALPINGO OOPHERECTOMY Bilateral 05/05/2020   Procedure: LAPAROSCOPIC BILATERAL SALPINGO OOPHORECTOMY WITH COLLECTION OF PELVIC WASHINGS AND LYSIS OF ADHEASIONS;  Surgeon: Nunzio Cobbs, MD;  Location: [facility];  Service: Gynecology;  Laterality: Bilateral;   TONSILLECTOMY  age 71   TUBAL LIGATION  1976   Social History   Socioeconomic History   Marital status: Widowed    Spouse name: Not on file   Number of children: 2   Years of education: Not on file   Highest education level: Associate degree: occupational, Hotel manager, or vocational program  Occupational History   Occupation: retired  Tobacco Use   Smoking status: Never   Smokeless tobacco: Never  Vaping Use   Vaping Use: Never used  Substance and Sexual Activity   Alcohol use: Yes    Alcohol/week: 7.0 standard drinks    Types: 7 Glasses of wine per week    Comment: 1 drink a day   Drug use: No   Sexual activity: Not Currently  Other Topics Concern   Not on file  Social History Narrative   Not on file   Social Determinants of Health   Financial Resource Strain: Low Risk    Difficulty of Paying Living Expenses: Not hard at all  Food Insecurity: No Food Insecurity   Worried  About Charity fundraiser in the Last Year: Never true   [place] in the Last Year: Never true  Transportation Needs: No Transportation Needs   Lack of Transportation (Medical): No   Lack of Transportation (Non-Medical): No  Physical Activity: Insufficiently Active   Days of Exercise per Week: 3 days   Minutes of Exercise per Session: 30 min  Stress: No Stress Concern Present   Feeling of Stress : Not at all  Social Connections: Moderately Isolated   Frequency of Communication with Friends and Family: More than three times a week   Frequency of Social Gatherings with Friends and Family: More than three times a week   Attends Religious Services: More than 4 times per year   Active Member of [employer] or Organizations: No   Attends Archivist Meetings: Never   Marital Status: Widowed  Human resources officer Violence: Not At Risk   Fear of Current or Ex-Partner: No   Emotionally Abused: No   Physically Abused: No   Sexually Abused: No   Family Status  Relation Name Status   Mother  Deceased at age 84       stents/alzheimer's   Father  Deceased       A-fib   Brother  Alive   MGM  Deceased   Sister  Alive  SVT/ablation   MGF  Deceased   PGF  Deceased   PGM  Deceased   Neg Hx  (Not Specified)   Family History  Problem Relation Age of Onset   Heart disease Mother    Diabetes Mother    Alzheimer's disease Mother    Heart attack Father    Heart disease Father    Skin cancer Father    COPD Father    Hypertension Father    Hypertension Brother    Heart disease Brother    Parkinson's disease Maternal Grandmother    Breast cancer Neg Hx    Allergies  Allergen Reactions   Amoxicillin Rash   Hydrocodone-Acetaminophen Rash    Patient Care Team: Jerrol Banana., MD as PCP - General (Unknown Physician Specialty) Ree Edman, MD (Dermatology) Nunzio Cobbs, MD as Consulting Physician (Obstetrics and Gynecology) Eulogio Bear, MD  as Consulting Physician (Ophthalmology)   Medications: Outpatient Medications Prior to Visit  Medication Sig   Latanoprost (XELPROS) 0.005 % EMUL Apply 1 drop to eye at bedtime. In both eyes   [DISCONTINUED] diphenhydrAMINE HCl (SLEEP AID) 50 MG/30ML LIQD Take 15 mLs by mouth daily as needed. (Patient not taking: Reported on 03/25/2021)   [DISCONTINUED] temazepam (RESTORIL) 15 MG capsule Take 1 capsule by mouth at bedtime as needed. (Patient not taking: No sig reported)   No facility-administered medications prior to visit.    Review of Systems  Constitutional: Negative.   HENT: Negative.    Eyes: Negative.   Respiratory: Negative.    Cardiovascular: Negative.   Gastrointestinal: Negative.   Endocrine: Negative.   Genitourinary: Negative.   Musculoskeletal: Negative.   Skin: Negative.   Allergic/Immunologic: Negative.   Neurological: Negative.   Hematological: Negative.   Psychiatric/Behavioral: Negative.       Objective    BP (!) 144/68 (BP Location: Right Arm)    Pulse 65    Temp 98.1 F (36.7 C) (Oral)    Ht 5\' 5"  (1.651 m)    Wt 190 lb (86.2 kg)    LMP 10/11/1999 (Approximate)    SpO2 96%    BMI 31.62 kg/m   Physical Exam Constitutional:      Appearance: Normal appearance. She is normal weight.  HENT:     Head: Normocephalic and atraumatic.     Right Ear: Tympanic membrane, ear canal and external ear normal.     Left Ear: Tympanic membrane, ear canal and external ear normal.     Nose: Nose normal.     Mouth/Throat:     Mouth: Mucous membranes are moist.     Pharynx: Oropharynx is clear.  Eyes:     Extraocular Movements: Extraocular movements intact.     Conjunctiva/sclera: Conjunctivae normal.     Pupils: Pupils are equal, round, and reactive to light.  Cardiovascular:     Rate and Rhythm: Normal rate and regular rhythm.     Pulses: Normal pulses.     Heart sounds: Normal heart sounds.  Pulmonary:     Effort: Pulmonary effort is normal.     Breath sounds:  Normal breath sounds.  Abdominal:     General: Abdomen is flat. Bowel sounds are normal.     Palpations: Abdomen is soft.  Musculoskeletal:        General: Normal range of motion.     Cervical back: Normal range of motion and neck supple.  Skin:    General: Skin is warm and dry.  Neurological:  General: No focal deficit present.     Mental Status: She is alert and oriented to person, place, and time. Mental status is at baseline.  Psychiatric:        Mood and Affect: Mood normal.        Behavior: Behavior normal.        Thought Content: Thought content normal.        Judgment: Judgment normal.      Last depression screening scores PHQ 2/9 Scores 10/15/2021 01/07/2021 09/17/2019  PHQ - 2 Score 1 0 0  PHQ- 9 Score 4 - -   Last fall risk screening Fall Risk  10/15/2021  Falls in the past year? 0  Number falls in past yr: 0  Injury with Fall? 0   Last Audit-C alcohol use screening Alcohol Use Disorder Test (AUDIT) 10/15/2021  1. How often do you have a drink containing alcohol? 4  2. How many drinks containing alcohol do you have on a typical day when you are drinking? 0  3. How often do you have six or more drinks on one occasion? 0  AUDIT-C Score 4  4. How often during the last year have you found that you were not able to stop drinking once you had started? -  5. How often during the last year have you failed to do what was normally expected from you because of drinking? -  6. How often during the last year have you needed a first drink in the morning to get yourself going after a heavy drinking session? -  7. How often during the last year have you had a feeling of guilt of remorse after drinking? -  8. How often during the last year have you been unable to remember what happened the night before because you had been drinking? -  9. Have you or someone else been injured as a result of your drinking? -  10. Has a relative or friend or a doctor or another health worker been  concerned about your drinking or suggested you cut down? -  Alcohol Use Disorder Identification Test Final Score (AUDIT) -  Alcohol Brief Interventions/Follow-up -   A score of 3 or more in women, and 4 or more in men indicates increased risk for alcohol abuse, EXCEPT if all of the points are from question 1   No results found for any visits on 10/15/21.  Assessment & Plan    Routine Health Maintenance and Physical Exam  Exercise Activities and Dietary recommendations  Goals      Have 3 meals a day     Recommend eating 3 small meals a day with 2 healthy snacks in between. Avoids high sodium snacks, junk foods and extra snacking.         Immunization History  Administered Date(s) Administered   Fluad Quad(high Dose 65+) 07/09/2019   Influenza, High Dose Seasonal PF 07/18/2018   Influenza-Unspecified 09/20/2021   PFIZER(Purple Top)SARS-COV-2 Vaccination 11/23/2019, 12/14/2019, 06/17/2020, 09/20/2021   Pneumococcal Conjugate-13 12/26/2016   Pneumococcal Polysaccharide-23 07/18/2018   Td 05/31/2005   Tdap 02/01/2010   Zoster, Live 08/13/2015    Health Maintenance  Topic Date Due   Zoster Vaccines- Shingrix (1 of 2) Never done   TETANUS/TDAP  01/07/2022 (Originally 02/02/2020)   COVID-19 Vaccine (5 - Booster for Pfizer series) 11/15/2021   COLONOSCOPY (Pts 45-72yrs Insurance coverage will need to be confirmed)  11/19/2021   MAMMOGRAM  05/12/2022   DEXA SCAN  02/03/2026   Pneumonia Vaccine  16+ Years old  Completed   INFLUENZA VACCINE  Completed   Hepatitis C Screening  Completed   HPV VACCINES  Aged Out    Discussed health benefits of physical activity, and encouraged her to engage in regular exercise appropriate for her age and condition.  Problem List Items Addressed This Visit       Musculoskeletal and Integument   Osteopenia   Relevant Orders   Vitamin D (25 hydroxy)     Other   HLD (hyperlipidemia)   Relevant Orders   Comprehensive Metabolic Panel (CMET)    Lipid Profile   Elevated BP without diagnosis of hypertension    Slightly elevated today, unusual for pt. She will monitor at home and return in 4 mo for recheck      Other Visit Diagnoses     Encounter for physical examination    -  Primary   Relevant Orders   HgB A1c       Return in about 4 months (around 02/12/2022) for hypertension.     I, [name], PA-C have reviewed all documentation for this visit. The documentation on  10/15/2021 for the exam, diagnosis, procedures, and orders are all accurate and complete.    [name], PA-C  [facility] [phone] (phone) [phone] (fax)  [place]

## 2021-10-15 NOTE — Patient Instructions (Signed)
Calcium 1200 and Vitamin D 500 - 2,000 IU

## 2021-10-15 NOTE — Assessment & Plan Note (Signed)
Slightly elevated today, unusual for pt. She will monitor at home and return in 4 mo for recheck

## 2021-10-16 LAB — HEMOGLOBIN A1C
Est. average glucose Bld gHb Est-mCnc: 111 mg/dL
Hgb A1c MFr Bld: 5.5 % (ref 4.8–5.6)

## 2021-10-16 LAB — COMPREHENSIVE METABOLIC PANEL
ALT: 51 IU/L — ABNORMAL HIGH (ref 0–32)
AST: 33 IU/L (ref 0–40)
Albumin/Globulin Ratio: 2.2 (ref 1.2–2.2)
Albumin: 4.7 g/dL (ref 3.8–4.8)
Alkaline Phosphatase: 62 IU/L (ref 44–121)
BUN/Creatinine Ratio: 15 (ref 12–28)
BUN: 14 mg/dL (ref 8–27)
Bilirubin Total: 0.5 mg/dL (ref 0.0–1.2)
CO2: 24 mmol/L (ref 20–29)
Calcium: 9.8 mg/dL (ref 8.7–10.3)
Chloride: 106 mmol/L (ref 96–106)
Creatinine, Ser: 0.93 mg/dL (ref 0.57–1.00)
Globulin, Total: 2.1 g/dL (ref 1.5–4.5)
Glucose: 91 mg/dL (ref 70–99)
Potassium: 5 mmol/L (ref 3.5–5.2)
Sodium: 140 mmol/L (ref 134–144)
Total Protein: 6.8 g/dL (ref 6.0–8.5)
eGFR: 66 mL/min/{1.73_m2} (ref >59)

## 2021-10-16 LAB — LIPID PANEL
Chol/HDL Ratio: 3.9 ratio (ref 0.0–4.4)
Cholesterol, Total: 196 mg/dL (ref 100–199)
HDL: 50 mg/dL (ref >39)
LDL Chol Calc (NIH): 115 mg/dL — ABNORMAL HIGH (ref 0–99)
Triglycerides: 178 mg/dL — ABNORMAL HIGH (ref 0–149)
VLDL Cholesterol Cal: 31 mg/dL (ref 5–40)

## 2021-10-16 LAB — VITAMIN D 25 HYDROXY (VIT D DEFICIENCY, FRACTURES): Vit D, 25-Hydroxy: 28.6 ng/mL — ABNORMAL LOW (ref 30.0–100.0)

## 2021-10-18 DIAGNOSIS — Z872 Personal history of diseases of the skin and subcutaneous tissue: Secondary | ICD-10-CM | POA: Diagnosis not present

## 2021-10-18 DIAGNOSIS — D225 Melanocytic nevi of trunk: Secondary | ICD-10-CM | POA: Diagnosis not present

## 2021-10-18 DIAGNOSIS — Z86018 Personal history of other benign neoplasm: Secondary | ICD-10-CM | POA: Diagnosis not present

## 2021-10-18 DIAGNOSIS — D485 Neoplasm of uncertain behavior of skin: Secondary | ICD-10-CM | POA: Diagnosis not present

## 2021-10-18 DIAGNOSIS — L578 Other skin changes due to chronic exposure to nonionizing radiation: Secondary | ICD-10-CM | POA: Diagnosis not present

## 2021-10-18 DIAGNOSIS — L738 Other specified follicular disorders: Secondary | ICD-10-CM | POA: Diagnosis not present

## 2021-10-20 DIAGNOSIS — H2513 Age-related nuclear cataract, bilateral: Secondary | ICD-10-CM | POA: Diagnosis not present

## 2021-11-17 DIAGNOSIS — L988 Other specified disorders of the skin and subcutaneous tissue: Secondary | ICD-10-CM | POA: Diagnosis not present

## 2021-11-17 DIAGNOSIS — D235 Other benign neoplasm of skin of trunk: Secondary | ICD-10-CM | POA: Diagnosis not present

## 2022-01-27 ENCOUNTER — Telehealth: Payer: Self-pay | Admitting: Family Medicine

## 2022-01-27 NOTE — Telephone Encounter (Signed)
Copied from [place] [id number]. Topic: Medicare AWV ?>> Jan 27, 2022  1:11 PM Cher Nakai R wrote: ?Reason for CRM:  ?Left message for patient to call back and schedule Medicare Annual Wellness Visit (AWV) in office.  ? ?If unable to come into the office for AWV,  please offer to do virtually or by telephone. ? ?Last AWV:  01/07/2021 ? ?Please schedule at anytime with [facility] Health Advisor. ? ?30 minute appointment for Virtual or phone ?45 minute appointment for in office or Initial virtual/phone ? ?Any questions, please contact me at [phone] ?

## 2022-01-31 ENCOUNTER — Telehealth: Payer: Self-pay | Admitting: Family Medicine

## 2022-01-31 NOTE — Telephone Encounter (Signed)
Copied from [place] [id number]. Topic: Medicare AWV ?>> Jan 31, 2022  8:22 AM Cher Nakai R wrote: ?Reason for CRM:  ?Left message for patient to call back and schedule Medicare Annual Wellness Visit (AWV) in office.  ? ?If unable to come into the office for AWV,  please offer to do virtually or by telephone. ? ?Last AWV:  01/07/2021 ? ?Please schedule at anytime with [facility] Health Advisor. ? ?30 minute appointment for Virtual or phone ?45 minute appointment for in office or Initial virtual/phone ? ?Any questions, please contact me at [phone] ?

## 2022-01-31 NOTE — Telephone Encounter (Signed)
Copied from [place] [id number]. Topic: Medicare AWV ?>> Jan 31, 2022  8:22 AM Cher Nakai R wrote: ?Reason for CRM:  ?Left message for patient to call back and schedule Medicare Annual Wellness Visit (AWV) in office.  ? ?If unable to come into the office for AWV,  please offer to do virtually or by telephone. ? ?Last AWV:  01/07/2021 ? ?Please schedule at anytime with [facility] Health Advisor. ? ?30 minute appointment for Virtual or phone ?45 minute appointment for in office or Initial virtual/phone ? ?Any questions, please contact me at [phone] ?

## 2022-02-10 NOTE — Progress Notes (Signed)
?  ? ? ?I,Sha'taria Tyson,acting as a Education administrator for [employer], PA-C.,have documented all relevant documentation on the behalf of Mary Kirschner, PA-C,as directed by  Mary Kirschner, PA-C while in the presence of Mary Kirschner, PA-C. ? ?Established patient visit ? ? ?Patient: Mary [name]   DOB: [birth year]   72 y.o. Female  MRN: [id number] ?Visit Date: 02/11/2022 ? ?Today's healthcare provider: [name], PA-C  ? ?Cc. Htn f/u ? ?Subjective  ?  ?HPI  ?Patient is being seen today for 4 month hypertension follow-up. ?Hypertension, follow-up ? ?BP Readings from Last 3 Encounters:  ?02/11/22 127/68  ?10/15/21 (!) 144/68  ?03/25/21 (!) 111/59  ? Wt Readings from Last 3 Encounters:  ?02/11/22 186 lb 14.4 oz (84.8 kg)  ?10/15/21 190 lb (86.2 kg)  ?03/25/21 186 lb 12.8 oz (84.7 kg)  ?  ? ?She was last seen for hypertension 4 months ago.  ?BP at that visit was 144/68. Management since that visit includes none. ? ? ?She is following a Regular diet. ?She is exercising. ?She does not smoke. ? ? ?Outside blood pressures are not being checked ?Symptoms: ?No chest pain No chest pressure  ?No palpitations No syncope  ?No dyspnea No orthopnea  ?No paroxysmal nocturnal dyspnea No lower extremity edema  ? ?Pertinent labs ?Lab Results  ?Component Value Date  ? CHOL 196 10/15/2021  ? HDL 50 10/15/2021  ? LDLCALC 115 (H) 10/15/2021  ? TRIG 178 (H) 10/15/2021  ? CHOLHDL 3.9 10/15/2021  ? Lab Results  ?Component Value Date  ? NA 140 10/15/2021  ? K 5.0 10/15/2021  ? CREATININE 0.93 10/15/2021  ? EGFR 66 10/15/2021  ? GLUCOSE 91 10/15/2021  ? TSH 1.230 03/25/2021  ?  ? ?The 10-year ASCVD risk score (Arnett DK, et al., 2019) is: 9.5% ? ?---------------------------------------------------------------------------------------------------  ? ?Medications: ?Outpatient Medications Prior to Visit  ?Medication Sig  ? Latanoprost (XELPROS) 0.005 % EMUL Apply 1 drop to eye at bedtime. In both eyes  ? ?No facility-administered medications prior to  visit.  ? ? ?Review of Systems  ?Constitutional:  Negative for fatigue and fever.  ?Respiratory:  Negative for cough and shortness of breath.   ?Cardiovascular:  Negative for chest pain and leg swelling.  ?Gastrointestinal:  Negative for abdominal pain.  ?Neurological:  Negative for dizziness and headaches.  ? ? ?  Objective  ?  ?Blood pressure 127/68, pulse 61, temperature 98.2 ?F (36.8 ?C), temperature source Oral, height 5' 6" (1.676 m), weight 186 lb 14.4 oz (84.8 kg), last menstrual period 10/11/1999, SpO2 100 %.  ? ?Physical Exam ?Constitutional:   ?   General: She is awake.  ?   Appearance: She is well-developed.  ?HENT:  ?   Head: Normocephalic.  ?Eyes:  ?   Conjunctiva/sclera: Conjunctivae normal.  ?Cardiovascular:  ?   Rate and Rhythm: Normal rate and regular rhythm.  ?   Heart sounds: Normal heart sounds.  ?Pulmonary:  ?   Effort: Pulmonary effort is normal.  ?   Breath sounds: Normal breath sounds.  ?Skin: ?   General: Skin is warm.  ?Neurological:  ?   Mental Status: She is alert and oriented to person, place, and time.  ?Psychiatric:     ?   Attention and Perception: Attention normal.     ?   Mood and Affect: Mood normal.     ?   Speech: Speech normal.     ?   Behavior: Behavior is cooperative.  ?  ? ?No  results found for any visits on 02/11/22. ? Assessment & Plan  ?  ? ?Problem List Items Addressed This Visit   ? ?  ? Other  ? Avitaminosis D  ?  Pt has been taking a supplement maybe 2-3 times a week ?Will hold off on rechecking levels for now; advised to continue to try to take supplement daily ? ?  ?  ? Elevated BP without diagnosis of hypertension - Primary  ?  In range today. Will continue to monitor ? ?  ?  ? ?Other Visit Diagnoses   ? ? Breast cancer screening by mammogram      ? Relevant Orders  ? MM 3D SCREEN BREAST BILATERAL  ? Screen for colon cancer      ? Relevant Orders  ? Ambulatory referral to Gastroenterology  ? ?  ?  ? ?Return in about 6 months (around 08/14/2022) for chronic  conditions.  ?   ?I, Mary Kirschner, PA-C have reviewed all documentation for this visit. The documentation on  02/11/2022 for the exam, diagnosis, procedures, and orders are all accurate and complete. ? ?Mary Kirschner, PA-C ?[facility] ?[id number] ?[place], [place], [address] ?Office: [phone] ?Fax: [phone]  ? ?[facility] ? ?

## 2022-02-11 ENCOUNTER — Encounter: Payer: Self-pay | Admitting: Physician Assistant

## 2022-02-11 ENCOUNTER — Ambulatory Visit (INDEPENDENT_AMBULATORY_CARE_PROVIDER_SITE_OTHER): Payer: PPO | Admitting: Physician Assistant

## 2022-02-11 VITALS — BP 127/68 | HR 61 | Temp 98.2°F | Ht 66.0 in | Wt 186.9 lb

## 2022-02-11 DIAGNOSIS — R030 Elevated blood-pressure reading, without diagnosis of hypertension: Secondary | ICD-10-CM | POA: Diagnosis not present

## 2022-02-11 DIAGNOSIS — E559 Vitamin D deficiency, unspecified: Secondary | ICD-10-CM | POA: Diagnosis not present

## 2022-02-11 DIAGNOSIS — Z1211 Encounter for screening for malignant neoplasm of colon: Secondary | ICD-10-CM | POA: Diagnosis not present

## 2022-02-11 DIAGNOSIS — Z1231 Encounter for screening mammogram for malignant neoplasm of breast: Secondary | ICD-10-CM | POA: Diagnosis not present

## 2022-02-11 NOTE — Assessment & Plan Note (Signed)
In range today. Will continue to monitor ?

## 2022-02-11 NOTE — Assessment & Plan Note (Signed)
Pt has been taking a supplement maybe 2-3 times a week ?Will hold off on rechecking levels for now; advised to continue to try to take supplement daily ?

## 2022-02-14 ENCOUNTER — Other Ambulatory Visit: Payer: Self-pay

## 2022-02-14 DIAGNOSIS — Z1211 Encounter for screening for malignant neoplasm of colon: Secondary | ICD-10-CM

## 2022-02-14 MED ORDER — NA SULFATE-K SULFATE-MG SULF 17.5-3.13-1.6 GM/177ML PO SOLN: 1.0000 | Freq: Once | ORAL | 0 refills | Status: AC

## 2022-02-14 NOTE — Progress Notes (Signed)
Gastroenterology Pre-Procedure Review ? ?Request Date: 03/10/2022 ?Requesting Physician: Dr. Vicente Males ? ?PATIENT REVIEW QUESTIONS: The patient responded to the following health history questions as indicated:   ? ?1. Are you having any GI issues? no ?2. Do you have a personal history of Polyps? no ?3. Do you have a family history of Colon Cancer or Polyps? no ?4. Diabetes Mellitus? no ?5. Joint replacements in the past 12 months?no ?6. Major health problems in the past 3 months?no ?7. Any artificial heart valves, MVP, or defibrillator?no ?   ?MEDICATIONS & ALLERGIES:    ?Patient reports the following regarding taking any anticoagulation/antiplatelet therapy:   ?Plavix, Coumadin, Eliquis, Xarelto, Lovenox, Pradaxa, Brilinta, or Effient? no ?Aspirin? no ? ?Patient confirms/reports the following medications:  ?Current Outpatient Medications  ?Medication Sig Dispense Refill  ? Latanoprost (XELPROS) 0.005 % EMUL Apply 1 drop to eye at bedtime. In both eyes    ? ?No current facility-administered medications for this visit.  ? ? ?Patient confirms/reports the following allergies:  ?Allergies  ?Allergen Reactions  ? Amoxicillin Rash  ? Hydrocodone-Acetaminophen Rash  ? ? ?No orders of the defined types were placed in this encounter. ? ? ?AUTHORIZATION INFORMATION ?Primary Insurance: ?1D#: ?Group #: ? ?Secondary Insurance: ?1D#: ?Group #: ? ?SCHEDULE INFORMATION: ?Date: 03/10/2022 ?Time: ?Location: [facility] ?

## 2022-02-21 ENCOUNTER — Telehealth: Payer: Self-pay | Admitting: Family Medicine

## 2022-02-21 NOTE — Telephone Encounter (Signed)
Copied from [place] [id number]. Topic: Medicare AWV ?>> Feb 21, 2022 12:10 PM Cher Nakai R wrote: ?Reason for CRM:  ?Left message for patient to call back and schedule Medicare Annual Wellness Visit (AWV) in office.  ? ?If unable to come into the office for AWV,  please offer to do virtually or by telephone. ? ?Last AWV:  01/07/2021 ? ?Please schedule at anytime with [facility] Health Advisor. ? ?30 minute appointment for Virtual or phone ?45 minute appointment for in office or Initial virtual/phone ? ?Any questions, please contact me at [phone] ?

## 2022-02-23 DIAGNOSIS — J0140 Acute pansinusitis, unspecified: Secondary | ICD-10-CM | POA: Diagnosis not present

## 2022-02-23 DIAGNOSIS — R051 Acute cough: Secondary | ICD-10-CM | POA: Diagnosis not present

## 2022-02-23 DIAGNOSIS — R519 Headache, unspecified: Secondary | ICD-10-CM | POA: Diagnosis not present

## 2022-03-10 ENCOUNTER — Ambulatory Visit
Admission: RE | Admit: 2022-03-10 | Discharge: 2022-03-10 | Disposition: A | Discharge status: Home / Self Care | Payer: PPO | Attending: Gastroenterology | Admitting: Gastroenterology

## 2022-03-10 ENCOUNTER — Ambulatory Visit: Payer: PPO | Admitting: Anesthesiology

## 2022-03-10 ENCOUNTER — Other Ambulatory Visit: Payer: Self-pay

## 2022-03-10 ENCOUNTER — Encounter
Admission: RE | Disposition: A | Discharge status: Home / Self Care | Payer: Self-pay | Source: Home / Self Care | Attending: Gastroenterology

## 2022-03-10 ENCOUNTER — Encounter: Payer: Self-pay | Admitting: Gastroenterology

## 2022-03-10 DIAGNOSIS — D122 Benign neoplasm of ascending colon: Secondary | ICD-10-CM | POA: Diagnosis not present

## 2022-03-10 DIAGNOSIS — Z90722 Acquired absence of ovaries, bilateral: Secondary | ICD-10-CM | POA: Insufficient documentation

## 2022-03-10 DIAGNOSIS — Z1211 Encounter for screening for malignant neoplasm of colon: Secondary | ICD-10-CM | POA: Insufficient documentation

## 2022-03-10 DIAGNOSIS — K635 Polyp of colon: Secondary | ICD-10-CM

## 2022-03-10 DIAGNOSIS — D126 Benign neoplasm of colon, unspecified: Secondary | ICD-10-CM | POA: Diagnosis not present

## 2022-03-10 DIAGNOSIS — Z86010 Personal history of colonic polyps: Secondary | ICD-10-CM | POA: Diagnosis not present

## 2022-03-10 HISTORY — PX: COLONOSCOPY WITH PROPOFOL: SHX5780

## 2022-03-10 SURGERY — COLONOSCOPY WITH PROPOFOL
Anesthesia: General

## 2022-03-10 MED ORDER — LIDOCAINE HCL (PF) 2 % IJ SOLN
INTRAMUSCULAR | Status: AC
  Filled 2022-03-10: qty 5

## 2022-03-10 MED ORDER — PROPOFOL 10 MG/ML IV BOLUS
INTRAVENOUS | Status: DC | PRN
  Administered 2022-03-10: 90 mg via INTRAVENOUS

## 2022-03-10 MED ORDER — SODIUM CHLORIDE 0.9 % IV SOLN: INTRAVENOUS | Status: DC

## 2022-03-10 MED ORDER — PROPOFOL 500 MG/50ML IV EMUL
INTRAVENOUS | Status: DC | PRN
  Administered 2022-03-10: 150 ug/kg/min via INTRAVENOUS

## 2022-03-10 MED ORDER — PROPOFOL 500 MG/50ML IV EMUL
INTRAVENOUS | Status: AC
  Filled 2022-03-10: qty 50

## 2022-03-10 NOTE — Anesthesia Postprocedure Evaluation (Signed)
Anesthesia Post Note  Patient: Mary [name]  Procedure(s) Performed: COLONOSCOPY WITH PROPOFOL  Patient location during evaluation: Endoscopy Anesthesia Type: General Level of consciousness: awake and alert Pain management: pain level controlled Vital Signs Assessment: post-procedure vital signs reviewed and stable Respiratory status: spontaneous breathing, nonlabored ventilation, respiratory function stable and patient connected to nasal cannula oxygen Cardiovascular status: blood pressure returned to baseline and stable Postop Assessment: no apparent nausea or vomiting Anesthetic complications: no   No notable events documented.   Last Vitals:  Vitals:   03/10/22 1012 03/10/22 1029  BP: 109/60 (!) 105/58  Pulse: 63   Resp: 16 16  Temp:    SpO2: 100%     Last Pain:  Vitals:   03/10/22 1029  TempSrc:   PainSc: 0-No pain                 Precious Haws Rahima Fleishman

## 2022-03-10 NOTE — Transfer of Care (Signed)
Immediate Anesthesia Transfer of Care Note  Patient: Mary [name]  Procedure(s) Performed: Procedure(s): COLONOSCOPY WITH PROPOFOL (N/A)  Patient Location: PACU and Endoscopy Unit  Anesthesia Type:General  Level of Consciousness: sedated  Airway & Oxygen Therapy: Patient Spontanous Breathing and Patient connected to nasal cannula oxygen  Post-op Assessment: Report given to RN and Post -op Vital signs reviewed and stable  Post vital signs: Reviewed and stable  Last Vitals:  Vitals:   03/10/22 0820 03/10/22 0959  BP: 128/76 115/67  Pulse: 71 70  Resp: 18 20  Temp: (!) 36 C   SpO2: 88% 41%    Complications: No apparent anesthesia complications

## 2022-03-10 NOTE — Anesthesia Procedure Notes (Signed)
Date/Time: 03/10/2022 9:35 AM Performed by: Doreen Salvage, CRNA Pre-anesthesia Checklist: Patient identified, Emergency Drugs available, Suction available and Patient being monitored Patient Re-evaluated:Patient Re-evaluated prior to induction Oxygen Delivery Method: Nasal cannula Induction Type: IV induction Dental Injury: Teeth and Oropharynx as per pre-operative assessment  Comments: Nasal cannula with etCO2 monitoring

## 2022-03-10 NOTE — H&P (Signed)
Mary Bellows, MD [address] [place], [place], [place], [address] [address], [place], [place], [place], [address] Phone: [phone]  Fax: [phone]  Primary Care Physician:  Jerrol Banana., MD   Pre-Procedure History & Physical: HPI:  Mary [name] is a 71 y.o. female is here for an colonoscopy.   Past Medical History:  Diagnosis Date   Adnexal mass    Elevated ALT measurement    History of blood transfusion 1973   Preglaucoma     Past Surgical History:  Procedure Laterality Date   COLONOSCOPY WITH PROPOFOL N/A 11/19/2018   Procedure: COLONOSCOPY WITH PROPOFOL;  Surgeon: Manya Silvas, MD;  Location: [facility] ENDOSCOPY;  Service: Endoscopy;  Laterality: N/A;   EXCISION OF ADNEXAL MASS     LAPAROSCOPIC BILATERAL SALPINGO OOPHERECTOMY Bilateral 05/05/2020   Procedure: LAPAROSCOPIC BILATERAL SALPINGO OOPHORECTOMY WITH COLLECTION OF PELVIC WASHINGS AND LYSIS OF ADHEASIONS;  Surgeon: Nunzio Cobbs, MD;  Location: [facility];  Service: Gynecology;  Laterality: Bilateral;   TONSILLECTOMY  age 82   [place]    Prior to Admission medications   Medication Sig Start Date End Date Taking? Authorizing Provider  Latanoprost (XELPROS) 0.005 % EMUL Apply 1 drop to eye at bedtime. In both eyes   Yes [provider]    Allergies as of 02/14/2022 - Review Complete 02/14/2022  Allergen Reaction Noted   Amoxicillin Rash 07/14/2015   Hydrocodone-acetaminophen Rash 07/14/2015    Family History  Problem Relation Age of Onset   Heart disease Mother    Diabetes Mother    Alzheimer's disease Mother    Heart attack Father    Heart disease Father    Skin cancer Father    COPD Father    Hypertension Father    Hypertension Brother    Heart disease Brother    Parkinson's disease Maternal Grandmother    Breast cancer Neg Hx     Social History   Socioeconomic History   Marital status: Widowed    Spouse name: Not on file    Number of children: 2   Years of education: Not on file   Highest education level: Associate degree: occupational, Hotel manager, or vocational program  Occupational History   Occupation: retired  Tobacco Use   Smoking status: Never   Smokeless tobacco: Never  Vaping Use   Vaping Use: Never used  Substance and Sexual Activity   Alcohol use: Yes    Alcohol/week: 7.0 standard drinks    Types: 7 Glasses of wine per week    Comment: 1 drink a day   Drug use: No   Sexual activity: Not Currently  Other Topics Concern   Not on file  Social History Narrative   Not on file   Social Determinants of Health   Financial Resource Strain: Not on file  Food Insecurity: Not on file  Transportation Needs: Not on file  Physical Activity: Not on file  Stress: Not on file  Social Connections: Not on file  Intimate Partner Violence: Not on file    Review of Systems: See HPI, otherwise negative ROS  Physical Exam: BP 128/76   Pulse 71   Temp (!) 96.8 F (36 C) (Temporal)   Resp 18   Ht '5\' 6"'$  (1.676 m)   Wt 81.6 kg   LMP 10/11/1999 (Approximate)   SpO2 98%   BMI 29.05 kg/m  General:   Alert,  pleasant and cooperative in NAD Head:  Normocephalic  and atraumatic. Neck:  Supple; no masses or thyromegaly. Lungs:  Clear throughout to auscultation, normal respiratory effort.    Heart:  +S1, +S2, Regular rate and rhythm, No edema. Abdomen:  Soft, nontender and nondistended. Normal bowel sounds, without guarding, and without rebound.   Neurologic:  Alert and  oriented x4;  grossly normal neurologically.  Impression/Plan: [name] is here for an colonoscopy to be performed for surveillance due to prior history of colon polyps   Risks, benefits, limitations, and alternatives regarding  colonoscopy have been reviewed with the patient.  Questions have been answered.  All parties agreeable.   Mary Bellows, MD  03/10/2022, 9:23 AM

## 2022-03-10 NOTE — Op Note (Signed)
[facility] Gastroenterology Patient Name: Mary [name] Procedure Date: 03/10/2022 9:22 AM MRN: [id number] Account #: [id number] Date of Birth: 06/27/1951 Admit Type: Outpatient Age: 71 Room: [facility] ENDO ROOM 3 Gender: Female Note Status: Finalized Instrument Name: Jasper Riling [id number] Procedure:             Colonoscopy Indications:           Surveillance: Personal history of adenomatous polyps                         on last colonoscopy 3 years ago Providers:             Jonathon Bellows MD, MD Referring MD:          Janine Ores. Rosanna Randy, MD (Referring MD) Medicines:             Monitored Anesthesia Care Complications:         No immediate complications. Procedure:             Pre-Anesthesia Assessment:                        - Prior to the procedure, a History and Physical was                         performed, and patient medications, allergies and                         sensitivities were reviewed. The patient's tolerance                         of previous anesthesia was reviewed.                        - The risks and benefits of the procedure and the                         sedation options and risks were discussed with the                         patient. All questions were answered and informed                         consent was obtained.                        - ASA Grade Assessment: II - A patient with mild                         systemic disease.                        After obtaining informed consent, the colonoscope was                         passed under direct vision. Throughout the procedure,                         the patient's blood pressure, pulse, and oxygen  saturations were monitored continuously. The                         Colonoscope was introduced through the anus and                         advanced to the the cecum, identified by the                         appendiceal orifice. The colonoscopy was performed                          with ease. The patient tolerated the procedure well.                         The quality of the bowel preparation was excellent. Findings:      The perianal and digital rectal examinations were normal.      Three sessile polyps were found in the ascending colon. The polyps were       4 to 6 mm in size. These polyps were removed with a cold snare.       Resection and retrieval were complete.      A 3 mm polyp was found in the rectum. The polyp was sessile. The polyp       was removed with a cold biopsy forceps. Resection and retrieval were       complete.      The exam was otherwise without abnormality on direct and retroflexion       views. Impression:            - Three 4 to 6 mm polyps in the ascending colon,                         removed with a cold snare. Resected and retrieved.                        - One 3 mm polyp in the rectum, removed with a cold                         biopsy forceps. Resected and retrieved.                        - The examination was otherwise normal on direct and                         retroflexion views. Recommendation:        - Discharge patient to home (with escort).                        - Resume previous diet.                        - Continue present medications.                        - Await pathology results.                        - Repeat colonoscopy in 3 years for surveillance based  on pathology results. Procedure Code(s):     --- Professional ---                        [id number], Colonoscopy, flexible; with removal of                         tumor(s), polyp(s), or other lesion(s) by snare                         technique                        45380, 7, Colonoscopy, flexible; with biopsy, single                         or multiple Diagnosis Code(s):     --- Professional ---                        Z86.010, Personal history of colonic polyps                        K63.5, Polyp of colon                         K62.1, Rectal polyp CPT copyright 2019 American Medical Association. All rights reserved. The codes documented in this report are preliminary and upon coder review may  be revised to meet current compliance requirements. Jonathon Bellows, MD Jonathon Bellows MD, MD 03/10/2022 10:00:07 AM This report has been signed electronically. Number of Addenda: 0 Note Initiated On: 03/10/2022 9:22 AM Scope Withdrawal Time: 0 hours 14 minutes 10 seconds  Total Procedure Duration: 0 hours 16 minutes 57 seconds  Estimated Blood Loss:  Estimated blood loss: none.      [facility]

## 2022-03-10 NOTE — Anesthesia Preprocedure Evaluation (Signed)
Anesthesia Evaluation  Patient identified by MRN, date of birth, ID band Patient awake    Reviewed: Allergy & Precautions, NPO status , Patient's Chart, lab work & pertinent test results  History of Anesthesia Complications Negative for: history of anesthetic complications  Airway Mallampati: III  TM Distance: <3 FB Neck ROM: full    Dental  (+) Chipped   Pulmonary neg pulmonary ROS, neg shortness of breath,    Pulmonary exam normal        Cardiovascular Normal cardiovascular exam+ dysrhythmias ("sensitive vagal")      Neuro/Psych negative neurological ROS  negative psych ROS   GI/Hepatic negative GI ROS, Neg liver ROS, neg GERD  ,  Endo/Other  negative endocrine ROS  Renal/GU negative Renal ROS  negative genitourinary   Musculoskeletal   Abdominal   Peds  Hematology negative hematology ROS (+)   Anesthesia Other Findings Past Medical History: No date: Adnexal mass No date: Elevated ALT measurement 1973: History of blood transfusion No date: Preglaucoma  Past Surgical History: 11/19/2018: COLONOSCOPY WITH PROPOFOL; N/A     Comment:  Procedure: COLONOSCOPY WITH PROPOFOL;  Surgeon: Manya Silvas, MD;  Location: [facility] ENDOSCOPY;  Service:               Endoscopy;  Laterality: N/A; No date: EXCISION OF ADNEXAL MASS 05/05/2020: LAPAROSCOPIC BILATERAL SALPINGO OOPHERECTOMY; Bilateral     Comment:  Procedure: LAPAROSCOPIC BILATERAL SALPINGO OOPHORECTOMY               WITH COLLECTION OF PELVIC WASHINGS AND LYSIS OF               ADHEASIONS;  Surgeon: Nunzio Cobbs, MD;                Location: [facility];  Service:               Gynecology;  Laterality: Bilateral; age 71: TONSILLECTOMY 1976: TUBAL LIGATION  BMI    Body Mass Index: 29.05 kg/m      Reproductive/Obstetrics negative OB ROS                             Anesthesia  Physical Anesthesia Plan  ASA: 2  Anesthesia Plan: General   Post-op Pain Management:    Induction: Intravenous  PONV Risk Score and Plan: Propofol infusion and TIVA  Airway Management Planned: Natural Airway and Nasal Cannula  Additional Equipment:   Intra-op Plan:   Post-operative Plan:   Informed Consent: I have reviewed the patients History and Physical, chart, labs and discussed the procedure including the risks, benefits and alternatives for the proposed anesthesia with the patient or authorized representative who has indicated his/her understanding and acceptance.     Dental Advisory Given  Plan Discussed with: Anesthesiologist, CRNA and Surgeon  Anesthesia Plan Comments: (Patient consented for risks of anesthesia including but not limited to:  - adverse reactions to medications - risk of airway placement if required - damage to eyes, teeth, lips or other oral mucosa - nerve damage due to positioning  - sore throat or hoarseness - Damage to heart, brain, nerves, lungs, other parts of body or loss of life  Patient voiced understanding.)        Anesthesia Quick Evaluation

## 2022-03-11 ENCOUNTER — Encounter: Payer: Self-pay | Admitting: Gastroenterology

## 2022-03-11 LAB — SURGICAL PATHOLOGY

## 2022-03-12 ENCOUNTER — Encounter: Payer: Self-pay | Admitting: Gastroenterology

## 2022-04-07 ENCOUNTER — Telehealth: Payer: Self-pay | Admitting: Family Medicine

## 2022-04-07 NOTE — Telephone Encounter (Signed)
Left message for patient to call back and schedule Medicare Annual Wellness Visit (AWV) in office.   If unable to come into the office for AWV,  please offer to do virtually or by telephone.  Last AWV: 01/07/2021  Please schedule at anytime with [facility] Health Advisor.  30 minute appointment for Virtual or phone 45 minute appointment for in office or Initial virtual/phone  Any questions, please contact me at [phone]

## 2022-04-18 DIAGNOSIS — Z86018 Personal history of other benign neoplasm: Secondary | ICD-10-CM | POA: Diagnosis not present

## 2022-04-18 DIAGNOSIS — Z872 Personal history of diseases of the skin and subcutaneous tissue: Secondary | ICD-10-CM | POA: Diagnosis not present

## 2022-04-18 DIAGNOSIS — L578 Other skin changes due to chronic exposure to nonionizing radiation: Secondary | ICD-10-CM | POA: Diagnosis not present

## 2022-04-18 DIAGNOSIS — L570 Actinic keratosis: Secondary | ICD-10-CM | POA: Diagnosis not present

## 2022-04-20 DIAGNOSIS — H401131 Primary open-angle glaucoma, bilateral, mild stage: Secondary | ICD-10-CM | POA: Diagnosis not present

## 2022-05-10 ENCOUNTER — Telehealth: Payer: Self-pay | Admitting: Family Medicine

## 2022-05-10 NOTE — Telephone Encounter (Signed)
Copied from [place] [id number]. Topic: Medicare AWV >> May 10, 2022  9:04 AM Jae Dire wrote: Reason for CRM:  Left message for patient to call back and schedule Medicare Annual Wellness Visit (AWV) in office.   If unable to come into the office for AWV,  please offer to do virtually or by telephone.  Last AWV: 01/07/2021  Please schedule at anytime with [facility] Health Advisor.  30 minute appointment for Virtual or phone 45 minute appointment for in office or Initial virtual/phone  Any questions, please contact me at [phone]

## 2022-06-30 ENCOUNTER — Telehealth: Payer: Self-pay

## 2022-06-30 NOTE — Chronic Care Management (AMB) (Signed)
  Care Coordination   Note   06/30/2022 Name: ALBERTO SCHOCH MRN: [id number] DOB: 10/25/1950  Janann Colonel Mcpartlin is a 71 y.o. year old female who sees Jerrol Banana., MD for primary care. I reached out to [name] by phone today to offer care coordination services.  Ms. Kook was given information about Care Coordination services today including:   The Care Coordination services include support from the care team which includes your Nurse Coordinator, Clinical Social Worker, or Pharmacist.  The Care Coordination team is here to help remove barriers to the health concerns and goals most important to you. Care Coordination services are voluntary, and the patient may decline or stop services at any time by request to their care team member.   Care Coordination Consent Status: Patient did not agree to participate in care coordination services at this time.    Encounter Outcome:  Pt. Refused  [address], [place], [place] [address] Direct Dial: [phone] [email or web address]

## 2022-09-14 ENCOUNTER — Encounter: Payer: Self-pay | Admitting: Family Medicine

## 2022-09-14 ENCOUNTER — Ambulatory Visit (INDEPENDENT_AMBULATORY_CARE_PROVIDER_SITE_OTHER): Payer: PPO | Admitting: Family Medicine

## 2022-09-14 VITALS — BP 138/79 | HR 68 | Wt 187.3 lb

## 2022-09-14 DIAGNOSIS — R0683 Snoring: Secondary | ICD-10-CM

## 2022-09-14 DIAGNOSIS — G4700 Insomnia, unspecified: Secondary | ICD-10-CM | POA: Diagnosis not present

## 2022-09-14 MED ORDER — TRAZODONE HCL 50 MG PO TABS: 25.0000 mg | ORAL_TABLET | Freq: Every evening | ORAL | 0 refills | Status: DC | PRN

## 2022-09-14 NOTE — Progress Notes (Signed)
    SUBJECTIVE:   CHIEF COMPLAINT / HPI:   INSOMNIA Duration: years Satisfied with sleep quality: mostly Difficulty falling asleep: no Difficulty staying asleep: yes Waking a few hours after sleep onset: yes Early morning awakenings: yes Daytime hypersomnolence: yes. Does not take naps during day.  Wakes feeling refreshed: no Good sleep hygiene: Glass of wine with TV, computer prior to bed. 11-12am bedtime, wakes 7-7:30am.  Apnea: unsure Snoring: yes Depressed/anxious mood: no Recent stress:  some work stress Restless legs/nocturnal leg cramps: no Chronic pain/arthritis: no History of sleep study: no Treatments attempted: melatonin, temazepam, nyquil (makes groggy the next day)    OBJECTIVE:   BP 138/79 (BP Location: Right Arm, Patient Position: Sitting, Cuff Size: Normal)   Pulse 68   Wt 187 lb 4.8 oz (85 kg)   LMP 10/11/1999 (Approximate)   SpO2 98%   BMI 30.23 kg/m   Gen: well appearing, in NAD Card: Reg rate Lungs: Comfortable WOB on RA Ext: WWP, no edema   ASSESSMENT/PLAN:   Insomnia, persistent Chronic, intermittent. Some screens before bed and caffeine use, counseled on sleep hygiene, details in AVS. Some report of snoring and daytime hypersomnolence, will refer for sleep study. Will trial low dose trazodone for insomnia refractory to hygiene changes. F/u 1 month, obtain PHQ/GAD at that time.      Myles Gip, DO

## 2022-09-14 NOTE — Assessment & Plan Note (Signed)
Chronic, intermittent. Some screens before bed and caffeine use, counseled on sleep hygiene, details in AVS. Some report of snoring and daytime hypersomnolence, will refer for sleep study. Will trial low dose trazodone for insomnia refractory to hygiene changes. F/u 1 month, obtain PHQ/GAD at that time.

## 2022-09-14 NOTE — Patient Instructions (Signed)
It was great to see you!  Our plans for today:  - Try jotting down your thoughts when you have trouble sleeping.  - We are referring you for a sleep study.  - Try the trazodone as needed.   Take care and seek immediate care sooner if you develop any concerns.   Dr. Ky Barban  - Try the following to help you sleep better:  - limit naps during the day  - no screens (TV, phone, tablet, computer) at least 1-2 hours before bedtime.  - have a quiet and dark sleeping environment.  - no large meals or drinks about 1 hour before bed.  - Avoid taking diuretics (hydrochlorothiazide, furosemide) in the evenings.  - Avoid caffeine after 3pm.  - Exercise or move your body regularly every day.  - You can also try melatonin 5 mg over the counter. Take this 1-2 hours before bed. You can increase to '10mg'$  if this is not helpful. - If you are lying in bed for 30 mins-1 hour and aren't falling asleep, get out of bed and do something relaxing like reading (NO TV!) until you are tired.

## 2022-09-25 ENCOUNTER — Ambulatory Visit (HOSPITAL_COMMUNITY)
Admission: EM | Admit: 2022-09-25 | Discharge: 2022-09-25 | Disposition: A | Discharge status: Home / Self Care | Payer: PPO | Attending: Family Medicine | Admitting: Family Medicine

## 2022-09-25 ENCOUNTER — Other Ambulatory Visit: Payer: Self-pay

## 2022-09-25 ENCOUNTER — Encounter (HOSPITAL_COMMUNITY): Payer: Self-pay | Admitting: Emergency Medicine

## 2022-09-25 DIAGNOSIS — K529 Noninfective gastroenteritis and colitis, unspecified: Secondary | ICD-10-CM | POA: Diagnosis not present

## 2022-09-25 LAB — COMPREHENSIVE METABOLIC PANEL
ALT: 76 U/L — ABNORMAL HIGH (ref 0–44)
AST: 72 U/L — ABNORMAL HIGH (ref 15–41)
Albumin: 4.1 g/dL (ref 3.5–5.0)
Alkaline Phosphatase: 46 U/L (ref 38–126)
Anion gap: 10 (ref 5–15)
BUN: 15 mg/dL (ref 8–23)
CO2: 24 mmol/L (ref 22–32)
Calcium: 9 mg/dL (ref 8.9–10.3)
Chloride: 106 mmol/L (ref 98–111)
Creatinine, Ser: 0.99 mg/dL (ref 0.44–1.00)
GFR, Estimated: >60 mL/min (ref >60)
Glucose, Bld: 77 mg/dL (ref 70–99)
Potassium: 4.6 mmol/L (ref 3.5–5.1)
Sodium: 140 mmol/L (ref 135–145)
Total Bilirubin: 1 mg/dL (ref 0.3–1.2)
Total Protein: 6.7 g/dL (ref 6.5–8.1)

## 2022-09-25 LAB — CBC WITH DIFFERENTIAL/PLATELET
Abs Immature Granulocytes: 0.02 10*3/uL (ref 0.00–0.07)
Basophils Absolute: 0 10*3/uL (ref 0.0–0.1)
Basophils Relative: 1 %
Eosinophils Absolute: 0.1 10*3/uL (ref 0.0–0.5)
Eosinophils Relative: 1 %
HCT: 43 % (ref 36.0–46.0)
Hemoglobin: 14.5 g/dL (ref 12.0–15.0)
Immature Granulocytes: 0 %
Lymphocytes Relative: 31 %
Lymphs Abs: 1.8 10*3/uL (ref 0.7–4.0)
MCH: 30.1 pg (ref 26.0–34.0)
MCHC: 33.7 g/dL (ref 30.0–36.0)
MCV: 89.4 fL (ref 80.0–100.0)
Monocytes Absolute: 0.6 10*3/uL (ref 0.1–1.0)
Monocytes Relative: 11 %
Neutro Abs: 3.3 10*3/uL (ref 1.7–7.7)
Neutrophils Relative %: 56 %
Platelets: 283 10*3/uL (ref 150–400)
RBC: 4.81 MIL/uL (ref 3.87–5.11)
RDW: 12.8 % (ref 11.5–15.5)
WBC: 5.8 10*3/uL (ref 4.0–10.5)
nRBC: 0 % (ref 0.0–0.2)

## 2022-09-25 NOTE — ED Provider Notes (Signed)
[place]    CSN: [id number] Arrival date & time: 09/25/22  1159      History   Chief Complaint Chief Complaint  Patient presents with   Diarrhea    HPI Mary [name] is a 71 y.o. female.    Diarrhea  Here for diarrhea that began on December 14.  She has not had any nausea or vomiting, but her appetite is decreased a lot.  The first 2 days the loose stools happen very frequently.  No blood in the stool.  Now she is having 3 or 4 small loose stools a day, mainly when she is urinating.  No abdominal cramping.  Her abdomen does feel full.  No fever or chills.  No cough or congestion.  She has been a little dizzy  Past Medical History:  Diagnosis Date   Adnexal mass    Elevated ALT measurement    History of blood transfusion 1973   Preglaucoma     Patient Active Problem List   Diagnosis Date Noted   Elevated BP without diagnosis of hypertension 10/15/2021   Insomnia, persistent 07/14/2015   Abnormal liver enzymes 07/14/2015   HLD (hyperlipidemia) 07/14/2015   Osteopenia 07/14/2015   Cyst of ovary 07/14/2015   Avitaminosis D 07/14/2015   Chest pain 03/10/2011    Past Surgical History:  Procedure Laterality Date   COLONOSCOPY WITH PROPOFOL N/A 11/19/2018   Procedure: COLONOSCOPY WITH PROPOFOL;  Surgeon: Manya Silvas, MD;  Location: [facility] ENDOSCOPY;  Service: Endoscopy;  Laterality: N/A;   COLONOSCOPY WITH PROPOFOL N/A 03/10/2022   Procedure: COLONOSCOPY WITH PROPOFOL;  Surgeon: Jonathon Bellows, MD;  Location: [facility] ENDOSCOPY;  Service: Gastroenterology;  Laterality: N/A;   EXCISION OF ADNEXAL MASS     LAPAROSCOPIC BILATERAL SALPINGO OOPHERECTOMY Bilateral 05/05/2020   Procedure: LAPAROSCOPIC BILATERAL SALPINGO OOPHORECTOMY WITH COLLECTION OF PELVIC WASHINGS AND LYSIS OF ADHEASIONS;  Surgeon: Nunzio Cobbs, MD;  Location: [facility];  Service: Gynecology;  Laterality: Bilateral;   TONSILLECTOMY  age 27   TUBAL LIGATION  1976     OB History     Gravida  2   Para  2   Term      Preterm      AB      Living  2      SAB      IAB      Ectopic      Multiple      Live Births               Home Medications    Prior to Admission medications   Medication Sig Start Date End Date Taking? Authorizing Provider  Latanoprost (XELPROS) 0.005 % EMUL Apply 1 drop to eye at bedtime. In both eyes    [provider]  traZODone (DESYREL) 50 MG tablet Take 0.5 tablets (25 mg total) by mouth at bedtime as needed for sleep. 09/14/22   Myles Gip, DO    Family History Family History  Problem Relation Age of Onset   Heart disease Mother    Diabetes Mother    Alzheimer's disease Mother    Heart attack Father    Heart disease Father    Skin cancer Father    COPD Father    Hypertension Father    Hypertension Brother    Heart disease Brother    Parkinson's disease Maternal Grandmother    Breast cancer Neg Hx     Social History Social History  Tobacco Use   Smoking status: Never   Smokeless tobacco: Never  Vaping Use   Vaping Use: Never used  Substance Use Topics   Alcohol use: Yes    Alcohol/week: 7.0 standard drinks of alcohol    Types: 7 Glasses of wine per week    Comment: 1 drink a day   Drug use: No     Allergies   Amoxicillin and Hydrocodone-acetaminophen   Review of Systems Review of Systems  Gastrointestinal:  Positive for diarrhea.     Physical Exam Triage Vital Signs ED Triage Vitals  Enc Vitals Group     BP 09/25/22 1452 (!) 147/83     Pulse Rate 09/25/22 1452 60     Resp 09/25/22 1452 (!) 22     Temp 09/25/22 1452 97.9 F (36.6 C)     Temp Source 09/25/22 1452 Oral     SpO2 09/25/22 1452 96 %     Weight --      Height --      Head Circumference --      Peak Flow --      Pain Score 09/25/22 1450 0     Pain Loc --      Pain Edu? --      Excl. in [place]? --    No data found.  Updated Vital Signs BP (!) 147/83 (BP Location: Left Arm)   Pulse  60   Temp 97.9 F (36.6 C) (Oral)   Resp (!) 22   LMP 10/11/1999 (Approximate)   SpO2 96%   Visual Acuity Right Eye Distance:   Left Eye Distance:   Bilateral Distance:    Right Eye Near:   Left Eye Near:    Bilateral Near:     Physical Exam Vitals reviewed.  Constitutional:      General: She is not in acute distress.    Appearance: She is not ill-appearing, toxic-appearing or diaphoretic.  HENT:     Nose: Nose normal.     Mouth/Throat:     Mouth: Mucous membranes are moist.     Pharynx: No oropharyngeal exudate or posterior oropharyngeal erythema.  Eyes:     Extraocular Movements: Extraocular movements intact.     Conjunctiva/sclera: Conjunctivae normal.     Pupils: Pupils are equal, round, and reactive to light.  Cardiovascular:     Rate and Rhythm: Normal rate and regular rhythm.     Heart sounds: No murmur heard. Pulmonary:     Effort: Pulmonary effort is normal.     Breath sounds: Normal breath sounds.  Abdominal:     General: There is no distension.     Palpations: Abdomen is soft.     Tenderness: There is abdominal tenderness (generalized).  Skin:    Coloration: Skin is not jaundiced or pale.  Neurological:     General: No focal deficit present.     Mental Status: She is alert and oriented to person, place, and time.  Psychiatric:        Behavior: Behavior normal.      UC Treatments / Results  Labs (all labs ordered are listed, but only abnormal results are displayed) Labs Reviewed  CBC WITH DIFFERENTIAL/PLATELET  COMPREHENSIVE METABOLIC PANEL    EKG   Radiology No results found.  Procedures Procedures (including critical care time)  Medications Ordered in UC Medications - No data to display  Initial Impression / Assessment and Plan / UC Course  I have reviewed the triage vital signs and the nursing notes.  Pertinent labs & imaging results that were available during my care of the patient were reviewed by me and considered in my medical  decision making (see chart for details).       Her vital signs are reassuring, and her blood pressure is not low and she has no tachycardia  Discussed with the patient that she most likely has a gastroenteritis that is been circulating in the community.  We discussed clear liquids and bland diet.  We discussed that she is probably past the time where antidiarrheals would be helpful.  CBC and CMP are drawn today.  Staff notify her if there is anything significantly abnormal.  I have asked her to follow-up with her primary care.  She is to present to the emergency room if she begins to vomit and cannot keep fluids down. Final Clinical Impressions(s) / UC Diagnoses   Final diagnoses:  Gastroenteritis     Discharge Instructions      Make sure you are getting in enough oral fluids and some bland food also.  We have drawn blood to check your blood counts and your electrolytes and kidney and liver function; staff will notify you if there is anything significantly abnormal  Please follow-up with your primary care later this week      ED Prescriptions   None    PDMP not reviewed this encounter.   Barrett Henle, MD 09/25/22 [id number]

## 2022-09-25 NOTE — Discharge Instructions (Signed)
Make sure you are getting in enough oral fluids and some bland food also.  We have drawn blood to check your blood counts and your electrolytes and kidney and liver function; staff will notify you if there is anything significantly abnormal  Please follow-up with your primary care later this week

## 2022-09-25 NOTE — ED Triage Notes (Addendum)
Complains of diarrhea for 4 days.  Denies any other symptoms. Reports 3 episodes of diarrhea.  Denies pain, but reports stomach feels "distended" "full" and has a "metal" taste in mouth.  Patient is drinking liquids.    Patient reports lying still is the most comfortable.  No appetite.

## 2022-09-26 ENCOUNTER — Ambulatory Visit: Payer: Self-pay | Admitting: *Deleted

## 2022-09-26 ENCOUNTER — Encounter: Payer: Self-pay | Admitting: Family Medicine

## 2022-09-26 ENCOUNTER — Ambulatory Visit (INDEPENDENT_AMBULATORY_CARE_PROVIDER_SITE_OTHER): Payer: PPO | Admitting: Family Medicine

## 2022-09-26 VITALS — BP 132/67 | HR 55 | Temp 97.5°F | Resp 16 | Wt 184.0 lb

## 2022-09-26 DIAGNOSIS — R197 Diarrhea, unspecified: Secondary | ICD-10-CM

## 2022-09-26 DIAGNOSIS — R748 Abnormal levels of other serum enzymes: Secondary | ICD-10-CM | POA: Diagnosis not present

## 2022-09-26 NOTE — Telephone Encounter (Signed)
Summary: diarrhea and results   Pt has had diarrhea since last Thursday / pt went to UC yesterday but doesn't understand the results / please advise / pt stated she is not getting better and can not eat much / when she eats she has diarrhea           Chief Complaint: diarrhea Symptoms: diarrhea water stools. Approx. 3-6 times. Diarrhea noted with drinking and eating crackers. Weakness, feeling tired, reports metal taste in mouth  Frequency: 09/22/22 Pertinent Negatives: Patient denies fever, no difficulty breathing, no dizziness, no dry mouth.  Disposition: '[]'$ ED /'[]'$ Urgent Care (no appt availability in office) / '[x]'$ Appointment(In office/virtual)/ '[]'$  [facility]/ '[]'$ Home Care/ '[]'$ Refused Recommended Disposition /'[]'$ [place] [facility]/ '[]'$  Follow-up with PCP Additional Notes:     Appt scheduled for today .        Reason for Disposition  [1] MODERATE diarrhea (e.g., 4-6 times / day more than normal) AND [2] present > 48 hours (2 days)  Answer Assessment - Initial Assessment Questions 1. DIARRHEA SEVERITY: "How bad is the diarrhea?" "How many more stools have you had in the past 24 hours than normal?"    - NO DIARRHEA (SCALE 0)   - MILD (SCALE 1-3): Few loose or mushy BMs; increase of 1-3 stools over normal daily number of stools; mild increase in ostomy output.   -  MODERATE (SCALE 4-7): Increase of 4-6 stools daily over normal; moderate increase in ostomy output.   -  SEVERE (SCALE 8-10; OR "WORST POSSIBLE"): Increase of 7 or more stools daily over normal; moderate increase in ostomy output; incontinence.     3 episodes until going to UC and then ate and went 4-5 times  2. ONSET: "When did the diarrhea begin?"      Last Thursday  3. BM CONSISTENCY: "How loose or watery is the diarrhea?"      Watery  4. VOMITING: "Are you also vomiting?" If Yes, ask: "How many times in the past 24 hours?"      No  5. ABDOMEN PAIN: "Are you having any abdomen pain?" If Yes, ask: "What  does it feel like?" (e.g., crampy, dull, intermittent, constant)      No  6. ABDOMEN PAIN SEVERITY: If present, ask: "How bad is the pain?"  (e.g., Scale 1-10; mild, moderate, or severe)   - MILD (1-3): doesn't interfere with normal activities, abdomen soft and not tender to touch    - MODERATE (4-7): interferes with normal activities or awakens from sleep, abdomen tender to touch    - SEVERE (8-10): excruciating pain, doubled over, unable to do any normal activities       na 7. ORAL INTAKE: If vomiting, "Have you been able to drink liquids?" "How much liquids have you had in the past 24 hours?"     Able to drink water but continues to urinate and have diarrhea together  8. HYDRATION: "Any signs of dehydration?" (e.g., dry mouth [not just dry lips], too weak to stand, dizziness, new weight loss) "When did you last urinate?"     Weak to stand , tired. Metal taste  9. EXPOSURE: "Have you traveled to a foreign country recently?" "Have you been exposed to anyone with diarrhea?" "Could you have eaten any food that was spoiled?"     na 10. ANTIBIOTIC USE: "Are you taking antibiotics now or have you taken antibiotics in the past 2 months?"       na 11. OTHER SYMPTOMS: "Do you have  any other symptoms?" (e.g., fever, blood in stool)       Weak to stand no fever now , metal taste in mouth .  12. PREGNANCY: "Is there any chance you are pregnant?" "When was your last menstrual period?"       na  Protocols used: [facility]

## 2022-09-26 NOTE — Progress Notes (Unsigned)
    SUBJECTIVE:   CHIEF COMPLAINT / HPI:   ABDOMINAL ISSUES - seen at [facility] yesterday for same. CBC wnl, CMP only notable for mildly elevated LFTs. - some LOA - no sick contacts, travel - no new or suspicious foods.  - COVID negative - no new meds.   Duration: 4 days Nature: bloating Location: epigastric, diffuse  Severity: {Blank single:19197::"mild","moderate","severe","1/10","2/10","3/10","4/10","5/10","6/10","7/10","8/10","9/10","10/10"}  Radiation: {Blank single:19197::"yes","no"} Episode duration: Frequency: {Blank single:19197::"constant","intermittent","occasional","rare","every few minutes","a few times a hour","a few times a day","a few times a week","a few times a month","a few times a year"} Alleviating factors: none Aggravating factors: eating Treatments attempted: none Constipation: no Diarrhea: yes Episodes of diarrhea/day: >5-6x per day, also during the night.  Mucous in the stool: yes Heartburn: no Bloating:yes Flatulence: no Nausea: no Vomiting: no Episodes of vomit/day: n/a Melena or hematochezia: no Rash: no Jaundice: no Fever:  low grade first night or two Weight loss: no   OBJECTIVE:   BP 132/67 (BP Location: Right Arm, Patient Position: Sitting, Cuff Size: Large)   Pulse (!) 55   Temp (!) 97.5 F (36.4 C) (Oral)   Resp 16   Wt 184 lb (83.5 kg)   LMP 10/11/1999 (Approximate)   SpO2 97%   BMI 29.70 kg/m   ***  ASSESSMENT/PLAN:   No problem-specific Assessment & Plan notes found for this encounter.     Myles Gip, DO

## 2022-09-27 LAB — COMPREHENSIVE METABOLIC PANEL
ALT: 74 IU/L — ABNORMAL HIGH (ref 0–32)
AST: 58 IU/L — ABNORMAL HIGH (ref 0–40)
Albumin/Globulin Ratio: 1.8 (ref 1.2–2.2)
Albumin: 4.6 g/dL (ref 3.8–4.8)
Alkaline Phosphatase: 66 IU/L (ref 44–121)
BUN/Creatinine Ratio: 14 (ref 12–28)
BUN: 14 mg/dL (ref 8–27)
Bilirubin Total: 0.6 mg/dL (ref 0.0–1.2)
CO2: 21 mmol/L (ref 20–29)
Calcium: 9.7 mg/dL (ref 8.7–10.3)
Chloride: 104 mmol/L (ref 96–106)
Creatinine, Ser: 1.01 mg/dL — ABNORMAL HIGH (ref 0.57–1.00)
Globulin, Total: 2.5 g/dL (ref 1.5–4.5)
Glucose: 100 mg/dL — ABNORMAL HIGH (ref 70–99)
Potassium: 4.7 mmol/L (ref 3.5–5.2)
Sodium: 141 mmol/L (ref 134–144)
Total Protein: 7.1 g/dL (ref 6.0–8.5)
eGFR: 60 mL/min/{1.73_m2} (ref >59)

## 2022-09-27 LAB — HCV INTERPRETATION

## 2022-09-27 LAB — ACUTE VIRAL HEPATITIS (HAV, HBV, HCV)
HCV Ab: NONREACTIVE
Hep A IgM: NEGATIVE
Hep B C IgM: NEGATIVE
Hepatitis B Surface Ag: NEGATIVE

## 2022-09-28 ENCOUNTER — Encounter: Payer: Self-pay | Admitting: *Deleted

## 2022-10-01 LAB — GI PROFILE, STOOL, PCR

## 2022-10-18 DIAGNOSIS — Z86018 Personal history of other benign neoplasm: Secondary | ICD-10-CM | POA: Diagnosis not present

## 2022-10-18 DIAGNOSIS — Z872 Personal history of diseases of the skin and subcutaneous tissue: Secondary | ICD-10-CM | POA: Diagnosis not present

## 2022-10-18 DIAGNOSIS — L578 Other skin changes due to chronic exposure to nonionizing radiation: Secondary | ICD-10-CM | POA: Diagnosis not present

## 2022-10-26 DIAGNOSIS — H401131 Primary open-angle glaucoma, bilateral, mild stage: Secondary | ICD-10-CM | POA: Diagnosis not present

## 2022-11-05 ENCOUNTER — Other Ambulatory Visit: Payer: Self-pay | Admitting: Family Medicine

## 2022-11-07 NOTE — Telephone Encounter (Signed)
Requested medication (s) are due for refill today - yes  Requested medication (s) are on the active medication list - yes  Future visit scheduled - no  Last refill: 09/14/22 #30  Notes to clinic: original Rx- no RF included- sent for provider review   Requested Prescriptions  Pending Prescriptions Disp Refills   traZODone (DESYREL) 50 MG tablet [Pharmacy Med Name: TRAZODONE 50 MG TABLET] 45 tablet 1    Sig: TAKE 0.5 TABLETS BY MOUTH AT BEDTIME AS NEEDED FOR SLEEP.     Psychiatry: Antidepressants - Serotonin Modulator Passed - 11/05/2022 11:32 AM      Passed - Valid encounter within last 6 months    Recent Outpatient Visits           1 month ago Diarrhea of presumed infectious origin   [place], Jake Church, DO   1 month ago Insomnia, persistent   [place], DO   8 months ago Elevated BP without diagnosis of hypertension   [facility] [name], PA-C   1 year ago Other hyperlipidemia   [facility] Jerrol Banana., MD   2 years ago Cyst of right ovary   [place] Jerrol Banana., MD                 Requested Prescriptions  Pending Prescriptions Disp Refills   traZODone (DESYREL) 50 MG tablet [Pharmacy Med Name: TRAZODONE 50 MG TABLET] 45 tablet 1    Sig: TAKE 0.5 TABLETS BY MOUTH AT BEDTIME AS NEEDED FOR SLEEP.     Psychiatry: Antidepressants - Serotonin Modulator Passed - 11/05/2022 11:32 AM      Passed - Valid encounter within last 6 months    Recent Outpatient Visits           1 month ago Diarrhea of presumed infectious origin   [place], Jake Church, DO   1 month ago Insomnia, persistent   [place], DO   8 months ago Elevated BP without diagnosis of hypertension   [facility] [name], PA-C   1 year ago Other hyperlipidemia   [facility] Jerrol Banana., MD   2 years ago Cyst of right ovary   [place] Jerrol Banana., MD

## 2022-11-09 DIAGNOSIS — H401131 Primary open-angle glaucoma, bilateral, mild stage: Secondary | ICD-10-CM | POA: Diagnosis not present

## 2022-12-08 DIAGNOSIS — H401131 Primary open-angle glaucoma, bilateral, mild stage: Secondary | ICD-10-CM | POA: Diagnosis not present

## 2022-12-29 DIAGNOSIS — H401131 Primary open-angle glaucoma, bilateral, mild stage: Secondary | ICD-10-CM | POA: Diagnosis not present

## 2023-02-20 ENCOUNTER — Telehealth: Payer: Self-pay | Admitting: Family Medicine

## 2023-02-20 NOTE — Telephone Encounter (Signed)
Contacted Mary [name] to schedule their annual wellness visit. Appointment made for 02/22/2023.  Thank you,  [facility] Support [facility] Direct dial  [phone]

## 2023-02-22 ENCOUNTER — Ambulatory Visit (INDEPENDENT_AMBULATORY_CARE_PROVIDER_SITE_OTHER): Payer: PPO

## 2023-02-22 VITALS — Ht 66.0 in | Wt 184.0 lb

## 2023-02-22 DIAGNOSIS — Z0000 Encounter for general adult medical examination without abnormal findings: Secondary | ICD-10-CM

## 2023-02-22 NOTE — Patient Instructions (Signed)
Mary [name] , Thank you for taking time to come for your Medicare Wellness Visit. I appreciate your ongoing commitment to your health goals. Please review the following plan we discussed and let me know if I can assist you in the future.   These are the goals we discussed:  Goals      Have 3 meals a day     Recommend eating 3 small meals a day with 2 healthy snacks in between. Avoids high sodium snacks, junk foods and extra snacking.         This is a list of the screening recommended for you and due dates:  Health Maintenance  Topic Date Due   Zoster (Shingles) Vaccine (1 of 2) Never done   DTaP/Tdap/Td vaccine (3 - Td or Tdap) 02/02/2020   Mammogram  05/12/2022   COVID-19 Vaccine (5 - 2023-24 season) 06/10/2022   Flu Shot  05/11/2023   Medicare Annual Wellness Visit  02/22/2024   Colon Cancer Screening  03/10/2025   DEXA scan (bone density measurement)  02/03/2026   Pneumonia Vaccine  Completed   Hepatitis C Screening: USPSTF Recommendation to screen - Ages 18-79 yo.  Completed   HPV Vaccine  Aged Out    Advanced directives: yes  Conditions/risks identified: none  Next appointment: Follow up in one year for your annual wellness visit 02/27/2024 @ 9:45am telephone   Preventive Care 65 Years and Older, Female Preventive care refers to lifestyle choices and visits with your health care provider that can promote health and wellness. What does preventive care include? A yearly physical exam. This is also called an annual well check. Dental exams once or twice a year. Routine eye exams. Ask your health care provider how often you should have your eyes checked. Personal lifestyle choices, including: Daily care of your teeth and gums. Regular physical activity. Eating a healthy diet. Avoiding tobacco and drug use. Limiting alcohol use. Practicing safe sex. Taking low-dose aspirin every day. Taking vitamin and mineral supplements as recommended by your health care  provider. What happens during an annual well check? The services and screenings done by your health care provider during your annual well check will depend on your age, overall health, lifestyle risk factors, and family history of disease. Counseling  Your health care provider may ask you questions about your: Alcohol use. Tobacco use. Drug use. Emotional well-being. Home and relationship well-being. Sexual activity. Eating habits. History of falls. Memory and ability to understand (cognition). Work and work Astronomer. Reproductive health. Screening  You may have the following tests or measurements: Height, weight, and BMI. Blood pressure. Lipid and cholesterol levels. These may be checked every 5 years, or more frequently if you are over 46 years old. Skin check. Lung cancer screening. You may have this screening every year starting at age 60 if you have a 30-pack-year history of smoking and currently smoke or have quit within the past 15 years. Fecal occult blood test (FOBT) of the stool. You may have this test every year starting at age 57. Flexible sigmoidoscopy or colonoscopy. You may have a sigmoidoscopy every 5 years or a colonoscopy every 10 years starting at age 51. Hepatitis C blood test. Hepatitis B blood test. Sexually transmitted disease (STD) testing. Diabetes screening. This is done by checking your blood sugar (glucose) after you have not eaten for a while (fasting). You may have this done every 1-3 years. Bone density scan. This is done to screen for osteoporosis. You may have this done  starting at age 42. Mammogram. This may be done every 1-2 years. Talk to your health care provider about how often you should have regular mammograms. Talk with your health care provider about your test results, treatment options, and if necessary, the need for more tests. Vaccines  Your health care provider may recommend certain vaccines, such as: Influenza vaccine. This is  recommended every year. Tetanus, diphtheria, and acellular pertussis (Tdap, Td) vaccine. You may need a Td booster every 10 years. Zoster vaccine. You may need this after age 44. Pneumococcal 13-valent conjugate (PCV13) vaccine. One dose is recommended after age 56. Pneumococcal polysaccharide (PPSV23) vaccine. One dose is recommended after age 51. Talk to your health care provider about which screenings and vaccines you need and how often you need them. This information is not intended to replace advice given to you by your health care provider. Make sure you discuss any questions you have with your health care provider. Document Released: 10/23/2015 Document Revised: 06/15/2016 Document Reviewed: 07/28/2015 Elsevier Interactive Patient Education  2017 [employer] Prevention in the Home Falls can cause injuries. They can happen to people of all ages. There are many things you can do to make your home safe and to help prevent falls. What can I do on the outside of my home? Regularly fix the edges of walkways and driveways and fix any cracks. Remove anything that might make you trip as you walk through a door, such as a raised step or threshold. Trim any bushes or trees on the path to your home. Use bright outdoor lighting. Clear any walking paths of anything that might make someone trip, such as rocks or tools. Regularly check to see if handrails are loose or broken. Make sure that both sides of any steps have handrails. Any raised decks and porches should have guardrails on the edges. Have any leaves, snow, or ice cleared regularly. Use sand or salt on walking paths during winter. Clean up any spills in your garage right away. This includes oil or grease spills. What can I do in the bathroom? Use night lights. Install grab bars by the toilet and in the tub and shower. Do not use towel bars as grab bars. Use non-skid mats or decals in the tub or shower. If you need to sit down in  the shower, use a plastic, non-slip stool. Keep the floor dry. Clean up any water that spills on the floor as soon as it happens. Remove soap buildup in the tub or shower regularly. Attach bath mats securely with double-sided non-slip rug tape. Do not have throw rugs and other things on the floor that can make you trip. What can I do in the bedroom? Use night lights. Make sure that you have a light by your bed that is easy to reach. Do not use any sheets or blankets that are too big for your bed. They should not hang down onto the floor. Have a firm chair that has side arms. You can use this for support while you get dressed. Do not have throw rugs and other things on the floor that can make you trip. What can I do in the kitchen? Clean up any spills right away. Avoid walking on wet floors. Keep items that you use a lot in easy-to-reach places. If you need to reach something above you, use a strong step stool that has a grab bar. Keep electrical cords out of the way. Do not use floor polish or wax that  makes floors slippery. If you must use wax, use non-skid floor wax. Do not have throw rugs and other things on the floor that can make you trip. What can I do with my stairs? Do not leave any items on the stairs. Make sure that there are handrails on both sides of the stairs and use them. Fix handrails that are broken or loose. Make sure that handrails are as long as the stairways. Check any carpeting to make sure that it is firmly attached to the stairs. Fix any carpet that is loose or worn. Avoid having throw rugs at the top or bottom of the stairs. If you do have throw rugs, attach them to the floor with carpet tape. Make sure that you have a light switch at the top of the stairs and the bottom of the stairs. If you do not have them, ask someone to add them for you. What else can I do to help prevent falls? Wear shoes that: Do not have high heels. Have rubber bottoms. Are comfortable  and fit you well. Are closed at the toe. Do not wear sandals. If you use a stepladder: Make sure that it is fully opened. Do not climb a closed stepladder. Make sure that both sides of the stepladder are locked into place. Ask someone to hold it for you, if possible. Clearly mark and make sure that you can see: Any grab bars or handrails. First and last steps. Where the edge of each step is. Use tools that help you move around (mobility aids) if they are needed. These include: Canes. Walkers. Scooters. Crutches. Turn on the lights when you go into a dark area. Replace any light bulbs as soon as they burn out. Set up your furniture so you have a clear path. Avoid moving your furniture around. If any of your floors are uneven, fix them. If there are any pets around you, be aware of where they are. Review your medicines with your doctor. Some medicines can make you feel dizzy. This can increase your chance of falling. Ask your doctor what other things that you can do to help prevent falls. This information is not intended to replace advice given to you by your health care provider. Make sure you discuss any questions you have with your health care provider. Document Released: 07/23/2009 Document Revised: 03/03/2016 Document Reviewed: 10/31/2014 Elsevier Interactive Patient Education  2017 [employer].

## 2023-02-22 NOTE — Progress Notes (Signed)
I connected with  Mary [name] on 02/22/23 by a audio enabled telemedicine application and verified that I am speaking with the correct person using two identifiers.  Patient Location: Home  Provider Location: Office/Clinic  I discussed the limitations of evaluation and management by telemedicine. The patient expressed understanding and agreed to proceed.  Subjective:   Mary [name] is a 72 y.o. female who presents for Medicare Annual (Subsequent) preventive examination.  Review of Systems    Cardiac Risk Factors include: advanced age (>72men, >31 women);dyslipidemia    Objective:    Today's Vitals   02/22/23 0946  Weight: 184 lb (83.5 kg)  Height: 5\' 6"  (1.676 m)   Body mass index is 29.7 kg/m.     02/22/2023    9:53 AM 03/10/2022    8:19 AM 01/07/2021    1:59 PM 05/05/2020    6:15 AM 02/27/2020    2:05 PM 09/17/2019    9:26 AM 11/19/2018   11:17 AM  Advanced Directives  Does Patient Have a Medical Advance Directive? Yes Yes Yes Yes Yes Yes Yes  Type of Estate agent of [place];Living will Healthcare Power of [place];Living will Healthcare Power of [place];Living will Healthcare Power of [place];Living will Healthcare Power of [place];Living will Healthcare Power of [place];Living will Healthcare Power of [place];Living will  Does patient want to make changes to medical advance directive?    No - Patient declined     Copy of Healthcare Power of Attorney in Chart?  No - copy requested No - copy requested No - copy requested Yes - validated most recent copy scanned in chart (See row information) No - copy requested No - copy requested  Would patient like information on creating a medical advance directive?  No - Patient declined         Current Medications (verified) Outpatient Encounter Medications as of 02/22/2023  Medication Sig   Latanoprost (XELPROS) 0.005 % EMUL Apply 1 drop to eye at bedtime. In both eyes (Patient not taking: Reported on  02/22/2023)   traZODone (DESYREL) 50 MG tablet TAKE 0.5 TABLETS BY MOUTH AT BEDTIME AS NEEDED FOR SLEEP. (Patient not taking: Reported on 02/22/2023)   No facility-administered encounter medications on file as of 02/22/2023.    Allergies (verified) Amoxicillin and Hydrocodone-acetaminophen   History: Past Medical History:  Diagnosis Date   Adnexal mass    Elevated ALT measurement    History of blood transfusion 1973   Preglaucoma    Past Surgical History:  Procedure Laterality Date   COLONOSCOPY WITH PROPOFOL N/A 11/19/2018   Procedure: COLONOSCOPY WITH PROPOFOL;  Surgeon: Scot Jun, MD;  Location[facility] ENDOSCOPY;  Service: Endoscopy;  Laterality: N/A;   COLONOSCOPY WITH PROPOFOL N/A 03/10/2022   Procedure: COLONOSCOPY WITH PROPOFOL;  Surgeon: Wyline Mood, MD;  Location: [facility] ENDOSCOPY;  Service: Gastroenterology;  Laterality: N/A;   EXCISION OF ADNEXAL MASS     LAPAROSCOPIC BILATERAL SALPINGO OOPHERECTOMY Bilateral 05/05/2020   Procedure: LAPAROSCOPIC BILATERAL SALPINGO OOPHORECTOMY WITH COLLECTION OF PELVIC WASHINGS AND LYSIS OF ADHEASIONS;  Surgeon: Patton Salles, MD;  Location: [facility];  Service: Gynecology;  Laterality: Bilateral;   TONSILLECTOMY  age 72   TUBAL LIGATION  1976   Family History  Problem Relation Age of Onset   Heart disease Mother    Diabetes Mother    Alzheimer's disease Mother    Heart attack Father    Heart disease Father    Skin cancer Father  COPD Father    Hypertension Father    Hypertension Brother    Heart disease Brother    Parkinson's disease Maternal Grandmother    Breast cancer Neg Hx    Social History   Socioeconomic History   Marital status: Widowed    Spouse name: Not on file   Number of children: 2   Years of education: Not on file   Highest education level: Associate degree: occupational, Scientist, product/process development, or vocational program  Occupational History   Occupation: retired  Tobacco Use   Smoking  status: Never   Smokeless tobacco: Never  Vaping Use   Vaping Use: Never used  Substance and Sexual Activity   Alcohol use: Yes    Alcohol/week: 7.0 standard drinks of alcohol    Types: 7 Glasses of wine per week    Comment: 1 drink a day   Drug use: No   Sexual activity: Not Currently  Other Topics Concern   Not on file  Social History Narrative   Not on file   Social Determinants of Health   Financial Resource Strain: Low Risk  (02/22/2023)   Overall Financial Resource Strain (CARDIA)    Difficulty of Paying Living Expenses: Not hard at all  Food Insecurity: No Food Insecurity (02/22/2023)   Hunger Vital Sign    Worried About Running Out of Food in the Last Year: Never true    Ran Out of Food in the Last Year: Never true  Transportation Needs: No Transportation Needs (02/22/2023)   PRAPARE - Administrator, Civil Service (Medical): No    Lack of Transportation (Non-Medical): No  Physical Activity: Insufficiently Active (02/22/2023)   Exercise Vital Sign    Days of Exercise per Week: 4 days    Minutes of Exercise per Session: 30 min  Stress: No Stress Concern Present (01/07/2021)   [employer] of Occupational Health - Occupational Stress Questionnaire    Feeling of Stress : Not at all  Social Connections: Moderately Isolated (02/22/2023)   Social Connection and Isolation Panel [NHANES]    Frequency of Communication with Friends and Family: More than three times a week    Frequency of Social Gatherings with Friends and Family: More than three times a week    Attends Religious Services: More than 4 times per year    Active Member of [employer] or Organizations: No    Attends Banker Meetings: Never    Marital Status: Widowed    Tobacco Counseling Counseling given: Not Answered  Clinical Intake:  Pre-visit preparation completed: Yes  Pain : No/denies pain   BMI - recorded: 29.7 Nutritional Status: BMI 25 -29 Overweight Nutritional Risks:  None Diabetes: No  How often do you need to have someone help you when you read instructions, pamphlets, or other written materials from your doctor or pharmacy?: 1 - Never  Diabetic?no  Interpreter Needed?: No  Comments: lives alone Information entered by :: B.Elynn Patteson,LPN   Activities of Daily Living    02/22/2023    9:53 AM 09/14/2022    4:07 PM  In your present state of health, do you have any difficulty performing the following activities:  Hearing? 0 0  Vision? 0 0  Difficulty concentrating or making decisions? 0 0  Walking or climbing stairs? 0 0  Dressing or bathing? 0 0  Doing errands, shopping? 0 0  Preparing Food and eating ? N   Using the Toilet? N   In the past six months, have you accidently leaked  urine? N   Do you have problems with loss of bowel control? N   Managing your Medications? N   Managing your Finances? N   Housekeeping or managing your Housekeeping? N     Patient Care Team: Ronnald Ramp, MD as PCP - General (Family Medicine) Jesusita Oka, MD (Dermatology) Patton Salles, MD as Consulting Physician (Obstetrics and Gynecology) Nevada Crane, MD as Consulting Physician (Ophthalmology)  Indicate any recent Medical Services you may have received from other than [facility] providers in the past year (date may be approximate).     Assessment:   This is a routine wellness examination for Mary [name].  Hearing/Vision screen No results found.  Dietary issues and exercise activities discussed: Current Exercise Habits: The patient has a physically strenuous job, but has no regular exercise apart from work.   Goals Addressed             This Visit's Progress    Have 3 meals a day   On track    Recommend eating 3 small meals a day with 2 healthy snacks in between. Avoids high sodium snacks, junk foods and extra snacking.        Depression Screen    09/14/2022    4:07 PM 10/15/2021    9:06 AM 01/07/2021    1:58 PM  09/17/2019    9:27 AM 08/29/2018    2:56 PM 12/26/2016    8:38 AM 12/26/2016    8:22 AM  PHQ 2/9 Scores  PHQ - 2 Score 0 1 0 0 2 0 0  PHQ- 9 Score 3 4   7  0     Fall Risk    10/15/2021    9:05 AM 01/07/2021    1:59 PM 09/17/2019    9:27 AM 08/29/2018    2:56 PM 12/26/2016    8:22 AM  Fall Risk   Falls in the past year? 0 0 0 0 No  Number falls in past yr: 0 0 0    Injury with Fall? 0 0 0      FALL RISK PREVENTION PERTAINING TO THE HOME:  Any stairs in or around the home? Yes  If so, are there any without handrails? Yes  Home free of loose throw rugs in walkways, pet beds, electrical cords, etc? Yes  Adequate lighting in your home to reduce risk of falls? Yes   ASSISTIVE DEVICES UTILIZED TO PREVENT FALLS:  Life alert? No  Use of a cane, walker or w/c? No  Grab bars in the bathroom? Yes  Shower chair or bench in shower? No  Elevated toilet seat or a handicapped toilet? No   Cognitive Function:        02/22/2023    9:56 AM  6CIT Screen  What Year? 0 points  What month? 0 points  What time? 0 points  Count back from 20 0 points  Months in reverse 0 points  Repeat phrase 0 points  Total Score 0 points    Immunizations Immunization History  Administered Date(s) Administered   Fluad Quad(high Dose 65+) 07/09/2019, 09/10/2022   Influenza, High Dose Seasonal PF 07/18/2018   Influenza-Unspecified 07/22/2016, 09/20/2021   PFIZER(Purple Top)SARS-COV-2 Vaccination 11/23/2019, 12/14/2019, 06/17/2020, 09/20/2021   Pneumococcal Conjugate-13 12/26/2016   Pneumococcal Polysaccharide-23 07/18/2018   Td 05/31/2005   Tdap 02/01/2010   Zoster, Live 08/13/2015    TDAP status: Up to date  Flu Vaccine status: Up to date  Pneumococcal vaccine status: Up to date  Covid-19 vaccine  status: Completed vaccines  Qualifies for Shingles Vaccine? Yes   Zostavax completed No   Shingrix Completed?: No.    Education has been provided regarding the importance of this vaccine. Patient  has been advised to call insurance company to determine out of pocket expense if they have not yet received this vaccine. Advised may also receive vaccine at local pharmacy or Health Dept. Verbalized acceptance and understanding.  Screening Tests Health Maintenance  Topic Date Due   Zoster Vaccines- Shingrix (1 of 2) Never done   DTaP/Tdap/Td (3 - Td or Tdap) 02/02/2020   MAMMOGRAM  05/12/2022   COVID-19 Vaccine (5 - 2023-24 season) 06/10/2022   INFLUENZA VACCINE  05/11/2023   Medicare Annual Wellness (AWV)  02/22/2024   COLONOSCOPY (Pts 45-58yrs Insurance coverage will need to be confirmed)  03/10/2025   DEXA SCAN  02/03/2026   Pneumonia Vaccine 72+ Years old  Completed   Hepatitis C Screening  Completed   HPV VACCINES  Aged Out    Health Maintenance  Health Maintenance Due  Topic Date Due   Zoster Vaccines- Shingrix (1 of 2) Never done   DTaP/Tdap/Td (3 - Td or Tdap) 02/02/2020   MAMMOGRAM  05/12/2022   COVID-19 Vaccine (5 - 2023-24 season) 06/10/2022    Colorectal cancer screening: Type of screening: Colonoscopy. Completed yes. Repeat every 5 years  Mammogram status: Completed yes. Repeat every year  Bone Density status: Completed yes. Results reflect: Bone density results: OSTEOPENIA. Repeat every 3 years.  Lung Cancer Screening: (Low Dose CT Chest recommended if Age 53-80 years, 30 pack-year currently smoking OR have quit w/in 15years.) does not qualify.   Lung Cancer Screening Referral: no  Additional Screening:  Hepatitis C Screening: does not qualify; Completed yes  Vision Screening: Recommended annual ophthalmology exams for early detection of glaucoma and other disorders of the eye. Is the patient up to date with their annual eye exam?  Yes  Who is the provider or what is the name of the office in which the patient attends annual eye exams? [facility] If pt is not established with a provider, would they like to be referred to a provider to establish care? No .    Dental Screening: Recommended annual dental exams for proper oral hygiene  Community Resource Referral / Chronic Care Management: CRR required this visit?  No   CCM required this visit?  No      Plan:     I have personally reviewed and noted the following in the patient's chart:   Medical and social history Use of alcohol, tobacco or illicit drugs  Current medications and supplements including opioid prescriptions. Patient is not currently taking opioid prescriptions. Functional ability and status Nutritional status Physical activity Advanced directives List of other physicians Hospitalizations, surgeries, and ER visits in previous 12 months Vitals Screenings to include cognitive, depression, and falls Referrals and appointments  In addition, I have reviewed and discussed with patient certain preventive protocols, quality metrics, and best practice recommendations. A written personalized care plan for preventive services as well as general preventive health recommendations were provided to patient.     Sue Lush, LPN   1/61/0960   Nurse Notes: The patient states she is doing well and has no concerns or questions at this time.

## 2023-06-19 ENCOUNTER — Encounter: Payer: Self-pay | Admitting: Family Medicine

## 2023-06-19 ENCOUNTER — Ambulatory Visit: Payer: Self-pay

## 2023-06-19 ENCOUNTER — Ambulatory Visit (INDEPENDENT_AMBULATORY_CARE_PROVIDER_SITE_OTHER): Payer: PPO | Admitting: Family Medicine

## 2023-06-19 DIAGNOSIS — L309 Dermatitis, unspecified: Secondary | ICD-10-CM

## 2023-06-19 MED ORDER — PERMETHRIN 5 % EX CREA: 1.0000 | TOPICAL_CREAM | Freq: Once | CUTANEOUS | 1 refills | Status: AC

## 2023-06-19 MED ORDER — PREDNISONE 10 MG (21) PO TBPK: ORAL_TABLET | ORAL | 0 refills | Status: DC

## 2023-06-19 NOTE — Progress Notes (Signed)
Established patient visit   Patient: Mary [name]   DOB: 1951-05-14   72 y.o. Female  MRN: [id number] Visit Date: 06/19/2023  Today's healthcare provider: Mila Merry, MD   Chief Complaint  Patient presents with   Medical Management of Chronic Issues    Possible poison oak, has rash all over body, has been a problem since last week Thursday, prior treatment hydrocortisone cream.    Subjective    Discussed the use of AI scribe software for clinical note transcription with the patient, who gave verbal consent to proceed.  History of Present Illness   The patient presents with a severe, widespread, pruritic rash that she initially suspected to be poison ivy due to a history of severe reactions to the plant. However, the patient has not been in contact with poison ivy for several weeks, which is longer than her usual incubation period of three to four days. The rash began under the right arm, where the patient noticed a swollen gland, and has since spread to both arms, inner and outer thighs, hands, and toes. The patient describes the rash as intensely itchy and burning from the inside, to the point of distraction.  The patient has attempted to manage the rash with over-the-counter treatments, including dish detergent showers to remove oils and Benadryl for itch relief, but these have provided minimal relief. The patient also took an oatmeal bath, which provided temporary relief.  The patient has a history of severe and mild cases of poison ivy rash and is familiar with the presentation and progression of the condition. She has previously been treated with prednisone, which she describes as a "love-hate relationship" due to its effectiveness in relieving itch but causing side effects such as insomnia and jitteriness.  The patient recently traveled to [place] and stayed in a resort, raising the possibility of exposure to a new allergen or irritant. However, the patient has not changed  any personal care products or laundry detergents, ruling out these as potential causes of the rash.  The patient lives alone and reports no one else in her environment has developed a similar rash. She has not been in contact with any new substances or environments that could explain the rash's sudden onset and widespread distribution.       Medications: Outpatient Medications Prior to Visit  Medication Sig   [DISCONTINUED] Latanoprost (XELPROS) 0.005 % EMUL Apply 1 drop to eye at bedtime. In both eyes (Patient not taking: Reported on 06/19/2023)   [DISCONTINUED] traZODone (DESYREL) 50 MG tablet TAKE 0.5 TABLETS BY MOUTH AT BEDTIME AS NEEDED FOR SLEEP. (Patient not taking: Reported on 06/19/2023)   No facility-administered medications prior to visit.   Review of Systems     Objective    BP (!) 114/49 (BP Location: Left Arm, Patient Position: Sitting, Cuff Size: Large)   Pulse 70   Ht 5\' 6"  (1.676 m)   Wt 192 lb 4.8 oz (87.2 kg)   LMP 10/11/1999 (Approximate)   SpO2 98%   BMI 31.04 kg/m   Physical Exam  Physical Exam   SKIN: Bumps present under right arm, extending down the side, on both arms, inner and outer thighs, hands, and toes. New bumps on hands and elbow. Ring of bumps on hand.    No results found for any visits on 06/19/23.  Assessment & Plan        Contact Dermatitis Severe itching and rash consistent with poison ivy exposure, but recent travel and appearance  of some lesions suggest possible mite infestation. Patient has a history of severe reactions to poison ivy. -Prescribe Permethrin cream to cover for possible scabies -Prescribe Prednisone to reduce inflammation and itching. -Advise patient to wash all clothing and bedding in hot water to eliminate any residual resin or mites.         Mila Merry, MD  [facility] [phone] (phone) [phone] (fax)  [facility]

## 2023-06-19 NOTE — Telephone Encounter (Signed)
Chief Complaint: Skin rash  Symptoms: Skin rash cover more than 20% of patient's body  Frequency: constant  Pertinent Negatives: Patient denies fever, pain, nausea, vomiting Disposition: [] ED /[] Urgent Care (no appt availability in office) / [x] Appointment(In office/virtual)/ []  [place] [facility]/ [] Home Care/ [] Refused Recommended Disposition /[] [place] [facility]/ []  Follow-up with PCP Additional Notes: Patient reports a possible exposure to poison oak from working in her yard last week. Patient states the rash is cover the armpits, elbows, thighs, legs and feet at this time. Patient reports the rash is extremely itchy. Patient reports having this type of rash in the past and being prescribed steroids patient states this rash is the worse one she's had. Care advice was given and patient has been scheduled in office today at 1340.   Reason for Disposition  Rash involves more than 20% of the body  Answer Assessment - Initial Assessment Questions 1. APPEARANCE of RASH: "Describe the rash."      Red bumpy some are blisters 2. LOCATION: "Where is the rash located?"  (e.g., face, genitals, hands, legs)     Under armpit, elbow, hands, thighs, legs, on top of my feet  3. SIZE: "How large is the rash?"      It's all over my body  4. ONSET: "When did the rash begin?"      Thursday  5. ITCHING: "Does the rash itch?" If Yes, ask: "How bad is it?"   - MILD - doesn't interfere with normal activities   - MODERATE-SEVERE: interferes with work, school, sleep, or other activities      Severe 6. EXPOSURE:  "How were you exposed to the plant (poison ivy, poison oak, sumac)"  "When were you exposed?"       I was outside working in my yard  7. PAST HISTORY: "Have you had a poison ivy rash before?" If Yes, ask: "How bad was it?"     Yes, but it wasn't as bad  Protocols used: Poison Ivy - Oak - [facility]

## 2023-06-21 DIAGNOSIS — H401131 Primary open-angle glaucoma, bilateral, mild stage: Secondary | ICD-10-CM | POA: Diagnosis not present

## 2023-06-28 DIAGNOSIS — H401131 Primary open-angle glaucoma, bilateral, mild stage: Secondary | ICD-10-CM | POA: Diagnosis not present

## 2023-06-28 DIAGNOSIS — H2513 Age-related nuclear cataract, bilateral: Secondary | ICD-10-CM | POA: Diagnosis not present

## 2023-07-19 DIAGNOSIS — H43811 Vitreous degeneration, right eye: Secondary | ICD-10-CM | POA: Diagnosis not present

## 2023-07-19 DIAGNOSIS — H401131 Primary open-angle glaucoma, bilateral, mild stage: Secondary | ICD-10-CM | POA: Diagnosis not present

## 2023-07-19 DIAGNOSIS — H538 Other visual disturbances: Secondary | ICD-10-CM | POA: Diagnosis not present

## 2023-07-19 DIAGNOSIS — H2513 Age-related nuclear cataract, bilateral: Secondary | ICD-10-CM | POA: Diagnosis not present

## 2023-08-02 DIAGNOSIS — H2511 Age-related nuclear cataract, right eye: Secondary | ICD-10-CM | POA: Diagnosis not present

## 2023-08-21 ENCOUNTER — Encounter: Payer: Self-pay | Admitting: Ophthalmology

## 2023-08-21 DIAGNOSIS — H903 Sensorineural hearing loss, bilateral: Secondary | ICD-10-CM | POA: Diagnosis not present

## 2023-08-24 NOTE — Anesthesia Preprocedure Evaluation (Addendum)
Anesthesia Evaluation  Patient identified by MRN, date of birth, ID band Patient awake    Reviewed: Allergy & Precautions, H&P , NPO status , Patient's Chart, lab work & pertinent test results  Airway Mallampati: III  TM Distance: >3 FB Neck ROM: Full    Dental no notable dental hx.    Pulmonary neg pulmonary ROS   Pulmonary exam normal breath sounds clear to auscultation       Cardiovascular negative cardio ROS Normal cardiovascular exam Rhythm:Regular Rate:Normal     Neuro/Psych negative neurological ROS  negative psych ROS   GI/Hepatic negative GI ROS, Neg liver ROS,,,  Endo/Other  negative endocrine ROS    Renal/GU negative Renal ROS  negative genitourinary   Musculoskeletal negative musculoskeletal ROS (+)    Abdominal   Peds negative pediatric ROS (+)  Hematology negative hematology ROS (+)   Anesthesia Other Findings Preglaucoma  Adnexal mass History of blood transfusion Elevated ALT measurement Elevated BP without diagnosis of hypertension  Vagal reaction Multiple times to cold soft drinks, "brain freeze" from cold liquids, passes out, hits the floor, has had ambulance multiple times for this. No longer consumes cold drinks!   Reproductive/Obstetrics negative OB ROS                             Anesthesia Physical Anesthesia Plan  ASA: 2  Anesthesia Plan: MAC   Post-op Pain Management:    Induction: Intravenous  PONV Risk Score and Plan:   Airway Management Planned: Natural Airway and Nasal Cannula  Additional Equipment:   Intra-op Plan:   Post-operative Plan:   Informed Consent: I have reviewed the patients History and Physical, chart, labs and discussed the procedure including the risks, benefits and alternatives for the proposed anesthesia with the patient or authorized representative who has indicated his/her understanding and acceptance.     Dental  Advisory Given  Plan Discussed with: Anesthesiologist, CRNA and Surgeon  Anesthesia Plan Comments: (Patient consented for risks of anesthesia including but not limited to:  - adverse reactions to medications - damage to eyes, teeth, lips or other oral mucosa - nerve damage due to positioning  - sore throat or hoarseness - Damage to heart, brain, nerves, lungs, other parts of body or loss of life  Patient voiced understanding and assent.)       Anesthesia Quick Evaluation

## 2023-08-25 NOTE — Discharge Instructions (Signed)

## 2023-08-28 ENCOUNTER — Ambulatory Visit: Payer: PPO | Admitting: Anesthesiology

## 2023-08-28 ENCOUNTER — Other Ambulatory Visit: Payer: Self-pay

## 2023-08-28 ENCOUNTER — Encounter
Admission: RE | Disposition: A | Discharge status: Home / Self Care | Payer: Self-pay | Source: Home / Self Care | Attending: Ophthalmology

## 2023-08-28 ENCOUNTER — Encounter: Payer: Self-pay | Admitting: Ophthalmology

## 2023-08-28 ENCOUNTER — Ambulatory Visit
Admission: RE | Admit: 2023-08-28 | Discharge: 2023-08-28 | Disposition: A | Discharge status: Home / Self Care | Payer: PPO | Attending: Ophthalmology | Admitting: Ophthalmology

## 2023-08-28 DIAGNOSIS — E785 Hyperlipidemia, unspecified: Secondary | ICD-10-CM | POA: Diagnosis not present

## 2023-08-28 DIAGNOSIS — H401111 Primary open-angle glaucoma, right eye, mild stage: Secondary | ICD-10-CM | POA: Diagnosis not present

## 2023-08-28 DIAGNOSIS — H2511 Age-related nuclear cataract, right eye: Secondary | ICD-10-CM | POA: Diagnosis not present

## 2023-08-28 HISTORY — PX: CATARACT EXTRACTION W/PHACO: SHX586

## 2023-08-28 HISTORY — DX: Syncope and collapse: R55

## 2023-08-28 SURGERY — PHACOEMULSIFICATION, CATARACT, WITH IOL INSERTION
Anesthesia: Monitor Anesthesia Care | Laterality: Right

## 2023-08-28 MED ORDER — SIGHTPATH DOSE#1 BSS IO SOLN
INTRAOCULAR | Status: DC | PRN
  Administered 2023-08-28: 75 mL via OPHTHALMIC

## 2023-08-28 MED ORDER — SIGHTPATH DOSE#1 NA HYALUR & NA CHOND-NA HYALUR IO KIT
PACK | INTRAOCULAR | Status: DC | PRN
  Administered 2023-08-28: 1 via OPHTHALMIC

## 2023-08-28 MED ORDER — SIGHTPATH DOSE#1 BSS IO SOLN
INTRAOCULAR | Status: DC | PRN
  Administered 2023-08-28: 15 mL via INTRAOCULAR

## 2023-08-28 MED ORDER — TETRACAINE HCL 0.5 % OP SOLN
OPHTHALMIC | Status: AC
Start: 2023-08-28 — End: ?
  Filled 2023-08-28: qty 4

## 2023-08-28 MED ORDER — FENTANYL CITRATE (PF) 100 MCG/2ML IJ SOLN
INTRAMUSCULAR | Status: AC
  Filled 2023-08-28: qty 2

## 2023-08-28 MED ORDER — ARMC OPHTHALMIC DILATING DROPS
1.0000 | OPHTHALMIC | Status: DC | PRN
  Administered 2023-08-28 (×3): 1 via OPHTHALMIC

## 2023-08-28 MED ORDER — ARMC OPHTHALMIC DILATING DROPS
OPHTHALMIC | Status: AC
  Filled 2023-08-28: qty 0.5

## 2023-08-28 MED ORDER — MIDAZOLAM HCL 2 MG/2ML IJ SOLN
INTRAMUSCULAR | Status: DC | PRN
  Administered 2023-08-28 (×2): 1 mg via INTRAVENOUS

## 2023-08-28 MED ORDER — FENTANYL CITRATE (PF) 100 MCG/2ML IJ SOLN
INTRAMUSCULAR | Status: DC | PRN
  Administered 2023-08-28: 50 ug via INTRAVENOUS

## 2023-08-28 MED ORDER — LIDOCAINE HCL (PF) 2 % IJ SOLN
INTRAOCULAR | Status: DC | PRN
  Administered 2023-08-28: 4 mL via INTRAOCULAR

## 2023-08-28 MED ORDER — MOXIFLOXACIN HCL 0.5 % OP SOLN
OPHTHALMIC | Status: DC | PRN
  Administered 2023-08-28: .2 mL via OPHTHALMIC

## 2023-08-28 MED ORDER — TETRACAINE HCL 0.5 % OP SOLN
1.0000 [drp] | OPHTHALMIC | Status: DC | PRN
  Administered 2023-08-28 (×3): 1 [drp] via OPHTHALMIC

## 2023-08-28 MED ORDER — MIDAZOLAM HCL 2 MG/2ML IJ SOLN
INTRAMUSCULAR | Status: AC
  Filled 2023-08-28: qty 2

## 2023-08-28 SURGICAL SUPPLY — 15 items
CANNULA ANT/CHMB 27G (MISCELLANEOUS) IMPLANT
CANNULA ANT/CHMB 27GA (MISCELLANEOUS) ×1
CATARACT SUITE SIGHTPATH (MISCELLANEOUS) ×1
DISSECTOR HYDRO NUCLEUS 50X22 (MISCELLANEOUS) ×1 IMPLANT
FEE CATARACT SUITE SIGHTPATH (MISCELLANEOUS) ×1 IMPLANT
GLOVE PI ULTRA LF STRL 7.5 (GLOVE) ×1 IMPLANT
GLOVE SURG POLYISOPRENE 8.5 (GLOVE) ×1
GLOVE SURG SYN 8.5 PF PI BL (GLOVE) ×1 IMPLANT
IVANTIS HYDRUS MICROSTENT (Stent) ×1 IMPLANT
LENS IOL TECNIS EYHANCE 17.0 (Intraocular Lens) IMPLANT
MICROSTENT IVANTIS HYDRUS (Stent) IMPLANT
NDL FILTER BLUNT 18X1 1/2 (NEEDLE) ×1 IMPLANT
NEEDLE FILTER BLUNT 18X1 1/2 (NEEDLE) ×1
SYR 3ML LL SCALE MARK (SYRINGE) ×1 IMPLANT
SYR 5ML LL (SYRINGE) ×1 IMPLANT

## 2023-08-28 NOTE — Anesthesia Postprocedure Evaluation (Signed)
Anesthesia Post Note  Patient: Mary [name]  Procedure(s) Performed: CATARACT EXTRACTION PHACO AND INTRAOCULAR LENS PLACEMENT (IOC) RIGHT HYDRUS MICROSTENT (Right)  Patient location during evaluation: PACU Anesthesia Type: MAC Level of consciousness: awake and alert Pain management: pain level controlled Vital Signs Assessment: post-procedure vital signs reviewed and stable Respiratory status: spontaneous breathing, nonlabored ventilation, respiratory function stable and patient connected to nasal cannula oxygen Cardiovascular status: stable and blood pressure returned to baseline Postop Assessment: no apparent nausea or vomiting Anesthetic complications: no   No notable events documented.   Last Vitals:  Vitals:   08/28/23 0935 08/28/23 0941  BP: 127/66 128/66  Pulse: (!) 56 (!) 56  Resp: 14 11  Temp: (!) 36.4 C (!) 36.4 C  SpO2: 96% 95%    Last Pain:  Vitals:   08/28/23 0941  TempSrc:   PainSc: 0-No pain                 [name] C [name]

## 2023-08-28 NOTE — Transfer of Care (Signed)
Immediate Anesthesia Transfer of Care Note  Patient: Mary [name]  Procedure(s) Performed: CATARACT EXTRACTION PHACO AND INTRAOCULAR LENS PLACEMENT (IOC) RIGHT HYDRUS MICROSTENT (Right)  Patient Location: PACU  Anesthesia Type: MAC  Level of Consciousness: awake, alert  and patient cooperative  Airway and Oxygen Therapy: Patient Spontanous Breathing and Patient connected to supplemental oxygen  Post-op Assessment: Post-op Vital signs reviewed, Patient's Cardiovascular Status Stable, Respiratory Function Stable, Patent Airway and No signs of Nausea or vomiting  Post-op Vital Signs: Reviewed and stable  Complications: No notable events documented.

## 2023-08-28 NOTE — H&P (Signed)
[facility]   Primary Care Physician:  Ronnald Ramp, MD Ophthalmologist: Dr. Willey Blade  Pre-Procedure History & Physical: HPI:  Mary [name] is a 72 y.o. female here for cataract surgery.   Past Medical History:  Diagnosis Date   Adnexal mass    Elevated ALT measurement    History of blood transfusion 1973   Preglaucoma    Vagal reaction    strong vagal response to pain    Past Surgical History:  Procedure Laterality Date   COLONOSCOPY WITH PROPOFOL N/A 11/19/2018   Procedure: COLONOSCOPY WITH PROPOFOL;  Surgeon: Scot Jun, MD;  Location: [facility] ENDOSCOPY;  Service: Endoscopy;  Laterality: N/A;   COLONOSCOPY WITH PROPOFOL N/A 03/10/2022   Procedure: COLONOSCOPY WITH PROPOFOL;  Surgeon: Wyline Mood, MD;  Location[facility] ENDOSCOPY;  Service: Gastroenterology;  Laterality: N/A;   EXCISION OF ADNEXAL MASS     LAPAROSCOPIC BILATERAL SALPINGO OOPHERECTOMY Bilateral 05/05/2020   Procedure: LAPAROSCOPIC BILATERAL SALPINGO OOPHORECTOMY WITH COLLECTION OF PELVIC WASHINGS AND LYSIS OF ADHEASIONS;  Surgeon: Patton Salles, MD;  Location: [facility];  Service: Gynecology;  Laterality: Bilateral;   TONSILLECTOMY  age 7   TUBAL LIGATION  1976    Prior to Admission medications   Medication Sig Start Date End Date Taking? Authorizing Provider  brimonidine (ALPHAGAN) 0.2 % ophthalmic solution Place into both eyes in the morning and at bedtime.   Yes [provider]    Allergies as of 07/21/2023 - Review Complete 06/19/2023  Allergen Reaction Noted   Amoxicillin Rash 07/14/2015   Hydrocodone-acetaminophen Rash 07/14/2015    Family History  Problem Relation Age of Onset   Heart disease Mother    Diabetes Mother    Alzheimer's disease Mother    Heart attack Father    Heart disease Father    Skin cancer Father    COPD Father    Hypertension Father    Hypertension Brother    Heart disease Brother    Parkinson's disease  Maternal Grandmother    Breast cancer Neg Hx     Social History   Socioeconomic History   Marital status: Widowed    Spouse name: Not on file   Number of children: 2   Years of education: Not on file   Highest education level: Associate degree: occupational, Scientist, product/process development, or vocational program  Occupational History   Occupation: retired  Tobacco Use   Smoking status: Never   Smokeless tobacco: Never  Vaping Use   Vaping status: Never Used  Substance and Sexual Activity   Alcohol use: Yes    Alcohol/week: 7.0 standard drinks of alcohol    Types: 7 Glasses of wine per week    Comment: 1 drink a day   Drug use: No   Sexual activity: Not Currently  Other Topics Concern   Not on file  Social History Narrative   Not on file   Social Determinants of Health   Financial Resource Strain: Low Risk  (02/22/2023)   Overall Financial Resource Strain (CARDIA)    Difficulty of Paying Living Expenses: Not hard at all  Food Insecurity: No Food Insecurity (02/22/2023)   Hunger Vital Sign    Worried About Running Out of Food in the Last Year: Never true    Ran Out of Food in the Last Year: Never true  Transportation Needs: No Transportation Needs (02/22/2023)   PRAPARE - Administrator, Civil Service (Medical): No    Lack of Transportation (Non-Medical):  No  Physical Activity: Insufficiently Active (02/22/2023)   Exercise Vital Sign    Days of Exercise per Week: 4 days    Minutes of Exercise per Session: 30 min  Stress: No Stress Concern Present (01/07/2021)   [employer] of Occupational Health - Occupational Stress Questionnaire    Feeling of Stress : Not at all  Social Connections: Moderately Isolated (02/22/2023)   Social Connection and Isolation Panel [NHANES]    Frequency of Communication with Friends and Family: More than three times a week    Frequency of Social Gatherings with Friends and Family: More than three times a week    Attends Religious Services: More than  4 times per year    Active Member of [employer] or Organizations: No    Attends Banker Meetings: Never    Marital Status: Widowed  Intimate Partner Violence: Unknown (12/07/2022)   [facility]   HITS    Physically Hurt: Not on file    Insult or Talk Down To: Not on file    Threaten Physical Harm: Not on file    Scream or Curse: Not on file    Review of Systems: See HPI, otherwise negative ROS  Physical Exam: BP 137/77   Pulse (!) 58   Temp 97.7 F (36.5 C) (Temporal)   Resp 18   Ht 5\' 6"  (1.676 m)   Wt 87.5 kg   LMP 10/11/1999 (Approximate)   SpO2 95%   BMI 31.15 kg/m  General:   Alert, cooperative in NAD Head:  Normocephalic and atraumatic. Respiratory:  Normal work of breathing. Cardiovascular:  RRR  Impression/Plan: Mary [name] is here for cataract surgery.  Risks, benefits, limitations, and alternatives regarding cataract surgery have been reviewed with the patient.  Questions have been answered.  All parties agreeable.   Willey Blade, MD  08/28/2023, 9:03 AM

## 2023-08-28 NOTE — Op Note (Signed)
OPERATIVE NOTE  Mary [name] [id number] 08/28/2023  PREOPERATIVE DIAGNOSIS:   1.  Mild PRIMARY open angle glaucoma, right eye. H40.1111  2.  Nuclear sclerotic cataract right eye.  H25.11   POSTOPERATIVE DIAGNOSIS:    same.   PROCEDURE:   1.  Placement of trabecular bypass stent (hydrus) and phacoemusification with posterior chamber intraocular lens placement of the right eye  CPT [phone]   LENS: Implant Name Type Inv. Item Serial No. Manufacturer Lot No. LRB No. Used Action  LENS IOL TECNIS EYHANCE 17.0 - [id number] Intraocular Lens LENS IOL TECNIS EYHANCE 17.0 [id number] SIGHTPATH  Right 1 Implanted  IVANTIS HYDRUS MICROSTENT - [id number] Stent IVANTIS HYDRUS MICROSTENT  SIGHTPATH [id number] Right 1 Implanted      Procedure(s) with comments: CATARACT EXTRACTION PHACO AND INTRAOCULAR LENS PLACEMENT (IOC) RIGHT HYDRUS MICROSTENT (Right) - 3.19 0:26.4    SURGEON:  Willey Blade, MD, MPH  ANESTHESIOLOGIST: Anesthesiologist: Marisue Humble, MD CRNA: Barbette Hair, CRNA   ANESTHESIA:  MAC and intracameral preservative-free lidocaine 4%.  ESTIMATED BLOOD LOSS: less than 1 mL.   COMPLICATIONS:  None.   DESCRIPTION OF PROCEDURE:  The patient was identified in the holding room and transported to the operating room.   The patient was placed in the supine position under the operating microscope.  The right eye was prepped and draped in the usual sterile ophthalmic fashion.   A 1.0 millimeter clear-corneal paracentesis was made at the 10:30 position and a second paracentesis at 7:00.  0.5 ml of preservative-free 1% lidocaine with epinephrine was injected into the anterior chamber.  The anterior chamber was filled with viscoelastic.  A 2.4 millimeter keratome was used to make a near-clear corneal incision at the 8:00 position.   Attention was turned to the hydrus.  The patients head was turned to the left and the microscope was tilted to 035 degrees.  Ocular instruments/Glaukos OAL/H2  gonioprism was used coupled with viscoelastic on the cornea was used to visualize the trabecular meshwork. The hydrus was opened and introduced into the eye.  The meshwork was engaged with the tip of the injector and the hydrus stent was deployed into Schlemm's canal at 4:00.  The stent was well seated and in good position.  Next, attention was turned to the phacoemulsification A curvilinear capsulorrhexis was made with a cystotome and capsulorrhexis forceps.  Balanced salt solution was used to hydrodissect and hydrodelineate the nucleus.   Phacoemulsification was then used in stop and chop fashion to remove the lens nucleus and epinucleus.  The remaining cortex was then removed using the irrigation and aspiration handpiece. Viscoelastic was then placed into the capsular bag to distend it for lens placement.  A lens was then injected into the capsular bag.  The remaining viscoelastic was aspirated.   Wounds were hydrated with balanced salt solution.  The anterior chamber was inflated to a physiologic pressure with balanced salt solution.   Intracameral vigamox 0.1 mL undiluted was injected into the eye and a drop placed onto the ocular surface.  No wound leaks were noted. The patient was taken to the recovery room in stable condition without complications of anesthesia or surgery   Willey Blade 08/28/2023, 9:34 AM

## 2023-08-29 ENCOUNTER — Encounter: Payer: Self-pay | Admitting: Ophthalmology

## 2023-08-29 DIAGNOSIS — H2512 Age-related nuclear cataract, left eye: Secondary | ICD-10-CM | POA: Diagnosis not present

## 2023-09-04 NOTE — Discharge Instructions (Signed)

## 2023-09-11 ENCOUNTER — Other Ambulatory Visit: Payer: Self-pay

## 2023-09-11 ENCOUNTER — Ambulatory Visit
Admission: RE | Admit: 2023-09-11 | Discharge: 2023-09-11 | Disposition: A | Discharge status: Home / Self Care | Payer: PPO | Attending: Ophthalmology | Admitting: Ophthalmology

## 2023-09-11 ENCOUNTER — Ambulatory Visit: Payer: PPO | Admitting: Anesthesiology

## 2023-09-11 ENCOUNTER — Encounter: Payer: Self-pay | Admitting: Ophthalmology

## 2023-09-11 ENCOUNTER — Encounter
Admission: RE | Disposition: A | Discharge status: Home / Self Care | Payer: Self-pay | Source: Home / Self Care | Attending: Ophthalmology

## 2023-09-11 DIAGNOSIS — H2512 Age-related nuclear cataract, left eye: Secondary | ICD-10-CM | POA: Insufficient documentation

## 2023-09-11 DIAGNOSIS — Z833 Family history of diabetes mellitus: Secondary | ICD-10-CM | POA: Diagnosis not present

## 2023-09-11 DIAGNOSIS — H401121 Primary open-angle glaucoma, left eye, mild stage: Secondary | ICD-10-CM | POA: Insufficient documentation

## 2023-09-11 DIAGNOSIS — E1136 Type 2 diabetes mellitus with diabetic cataract: Secondary | ICD-10-CM | POA: Diagnosis not present

## 2023-09-11 DIAGNOSIS — I10 Essential (primary) hypertension: Secondary | ICD-10-CM | POA: Diagnosis not present

## 2023-09-11 HISTORY — PX: CATARACT EXTRACTION W/PHACO: SHX586

## 2023-09-11 SURGERY — PHACOEMULSIFICATION, CATARACT, WITH IOL INSERTION
Anesthesia: Topical | Site: Eye | Laterality: Left

## 2023-09-11 MED ORDER — TETRACAINE HCL 0.5 % OP SOLN
OPHTHALMIC | Status: AC
  Filled 2023-09-11: qty 4

## 2023-09-11 MED ORDER — SIGHTPATH DOSE#1 BSS IO SOLN
INTRAOCULAR | Status: DC | PRN
  Administered 2023-09-11: 66 mL via OPHTHALMIC

## 2023-09-11 MED ORDER — FENTANYL CITRATE (PF) 100 MCG/2ML IJ SOLN
INTRAMUSCULAR | Status: DC | PRN
  Administered 2023-09-11 (×2): 50 ug via INTRAVENOUS

## 2023-09-11 MED ORDER — SIGHTPATH DOSE#1 NA HYALUR & NA CHOND-NA HYALUR IO KIT
PACK | INTRAOCULAR | Status: DC | PRN
  Administered 2023-09-11: 1 via OPHTHALMIC

## 2023-09-11 MED ORDER — FENTANYL CITRATE (PF) 100 MCG/2ML IJ SOLN
INTRAMUSCULAR | Status: AC
  Filled 2023-09-11: qty 2

## 2023-09-11 MED ORDER — TETRACAINE HCL 0.5 % OP SOLN
1.0000 [drp] | OPHTHALMIC | Status: DC | PRN
  Administered 2023-09-11 (×3): 1 [drp] via OPHTHALMIC

## 2023-09-11 MED ORDER — MIDAZOLAM HCL 2 MG/2ML IJ SOLN
INTRAMUSCULAR | Status: AC
  Filled 2023-09-11: qty 2

## 2023-09-11 MED ORDER — MIDAZOLAM HCL 2 MG/2ML IJ SOLN
INTRAMUSCULAR | Status: DC | PRN
  Administered 2023-09-11: 2 mg via INTRAVENOUS

## 2023-09-11 MED ORDER — SIGHTPATH DOSE#1 BSS IO SOLN
INTRAOCULAR | Status: DC | PRN
  Administered 2023-09-11: 15 mL

## 2023-09-11 MED ORDER — ARMC OPHTHALMIC DILATING DROPS
1.0000 | OPHTHALMIC | Status: DC | PRN
  Administered 2023-09-11 (×3): 1 via OPHTHALMIC

## 2023-09-11 MED ORDER — MOXIFLOXACIN HCL 0.5 % OP SOLN
OPHTHALMIC | Status: DC | PRN
  Administered 2023-09-11: .2 mL via OPHTHALMIC

## 2023-09-11 MED ORDER — LIDOCAINE HCL (PF) 2 % IJ SOLN
INTRAOCULAR | Status: DC | PRN
  Administered 2023-09-11: 1 mL via INTRAOCULAR

## 2023-09-11 SURGICAL SUPPLY — 13 items
CATARACT SUITE SIGHTPATH (MISCELLANEOUS) ×1
DISSECTOR HYDRO NUCLEUS 50X22 (MISCELLANEOUS) ×1 IMPLANT
FEE CATARACT SUITE SIGHTPATH (MISCELLANEOUS) ×1 IMPLANT
GLOVE PI ULTRA LF STRL 7.5 (GLOVE) ×1 IMPLANT
GLOVE SURG POLYISOPRENE 8.5 (GLOVE) ×1
GLOVE SURG SYN 8.5 PF PI BL (GLOVE) ×1 IMPLANT
IVANTIS HYDRUS MICROSTENT (Stent) ×1 IMPLANT
LENS IOL TECNIS EYHANCE 21.0 (Intraocular Lens) IMPLANT
MICROSTENT IVANTIS HYDRUS (Stent) IMPLANT
NDL FILTER BLUNT 18X1 1/2 (NEEDLE) ×1 IMPLANT
NEEDLE FILTER BLUNT 18X1 1/2 (NEEDLE) ×1
SYR 3ML LL SCALE MARK (SYRINGE) ×1 IMPLANT
SYR 5ML LL (SYRINGE) ×1 IMPLANT

## 2023-09-11 NOTE — Op Note (Signed)
OPERATIVE NOTE  Mary [name] [id number] 09/11/2023  PREOPERATIVE DIAGNOSIS:   1.  Mild  PRIMARY open angle glaucoma, left eye. N82.9562  2.  Nuclear sclerotic cataract left eye.  H25.12   POSTOPERATIVE DIAGNOSIS:    same.   PROCEDURE:   1.  Placement of trabecular bypass stent (hydrus) and phacoemusification with posterior chamber intraocular lens placement of the left eye  CPT [id number]   LENS: Implant Name Type Inv. Item Serial No. Manufacturer Lot No. LRB No. Used Action  [name] MICROSTENT - [id number] Stent IVANTIS HYDRUS MICROSTENT  SIGHTPATH [id number] Left 1 Implanted  LENS IOL TECNIS EYHANCE 21.0 - [id number] Intraocular Lens LENS IOL TECNIS EYHANCE 21.0 [id number] SIGHTPATH  Left 1 Implanted      Procedure(s): CATARACT EXTRACTION PHACO AND INTRAOCULAR LENS PLACEMENT (IOC) LEFT HYDRUS MICROSTENT  3.12  00:25.1 (Left)  SURGEON:  Willey Blade, MD, MPH  ANESTHESIOLOGIST: Anesthesiologist: Yevette Edwards, MD CRNA: Barbette Hair, CRNA   ANESTHESIA:  MAC  and intracameral preservative-free intracameral lidocaine 4%.  ESTIMATED BLOOD LOSS: less than 1 mL.   COMPLICATIONS:  None.   DESCRIPTION OF PROCEDURE:  The patient was identified in the holding room and transported to the operating room.  The patient was placed in the supine position under the operating microscope.  The left eye was prepped and draped in the usual sterile ophthalmic fashion.   A 1.0 millimeter clear-corneal paracentesis was made at the 4:30 position, and a second paracentesis was made at 1:30 for the hydrus. 0.5 ml of preservative-free 1% lidocaine with epinephrine was injected into the anterior chamber.  The anterior chamber was filled with Healon 5 viscoelastic.  A 2.4 millimeter keratome was used to make a near-clear corneal incision at the 2:00 position.   Attention was turned to the hydrus stent.  The patients head was turned to the left and the microscope was tilted to 035 degrees.  Ocular  instruments/Glaukos OAL/H2 gonioprism was used with Healon 5 on the cornea to visualize the trabecular meshwork. The hydrus was introduced into the eye and the  meshwork was engaged with the tip of the and the stent was deployed into Schlemm's canal.  The stent was well seated and in good position.  Next, attention was turned to the phacoemulsification A curvilinear capsulorrhexis was made with a cystotome and capsulorrhexis forceps.  Balanced salt solution was used to hydrodissect and hydrodelineate the nucleus.   Phacoemulsification was then used in stop and chop fashion to remove the lens nucleus and epinucleus.  The remaining cortex was then removed using the irrigation and aspiration handpiece. Healon was then placed into the capsular bag to distend it for lens placement.  A lens was then injected into the capsular bag.  The remaining viscoelastic was aspirated.   Wounds were hydrated with balanced salt solution.  The anterior chamber was inflated to a physiologic pressure with balanced salt solution.   Intracameral vigamox 0.1 mL undiluted was injected into the eye and a drop placed onto the ocular surface.  No wound leaks were noted.  Protective glasses were placed on the patient.  The patient was taken to the recovery room in stable condition without complications of anesthesia or surgery   Willey Blade 09/11/2023, 12:17 PM

## 2023-09-11 NOTE — H&P (Signed)
[facility]   Primary Care Physician:  Ronnald Ramp, MD Ophthalmologist: Dr. Willey Blade  Pre-Procedure History & Physical: HPI:  Mary [name] is a 72 y.o. female here for cataract surgery.   Past Medical History:  Diagnosis Date   Adnexal mass    Elevated ALT measurement    History of blood transfusion 1973   Preglaucoma    Vagal reaction    strong vagal response to pain    Past Surgical History:  Procedure Laterality Date   CATARACT EXTRACTION W/PHACO Right 08/28/2023   Procedure: CATARACT EXTRACTION PHACO AND INTRAOCULAR LENS PLACEMENT (IOC) RIGHT HYDRUS MICROSTENT;  Surgeon: Nevada Crane, MD;  Location: [facility];  Service: Ophthalmology;  Laterality: Right;  3.19 0:26.4   COLONOSCOPY WITH PROPOFOL N/A 11/19/2018   Procedure: COLONOSCOPY WITH PROPOFOL;  Surgeon: Scot Jun, MD;  Location: [facility];  Service: Endoscopy;  Laterality: N/A;   COLONOSCOPY WITH PROPOFOL N/A 03/10/2022   Procedure: COLONOSCOPY WITH PROPOFOL;  Surgeon: Wyline Mood, MD;  Location: [facility] ENDOSCOPY;  Service: Gastroenterology;  Laterality: N/A;   EXCISION OF ADNEXAL MASS     LAPAROSCOPIC BILATERAL SALPINGO OOPHERECTOMY Bilateral 05/05/2020   Procedure: LAPAROSCOPIC BILATERAL SALPINGO OOPHORECTOMY WITH COLLECTION OF PELVIC WASHINGS AND LYSIS OF ADHEASIONS;  Surgeon: Patton Salles, MD;  Location: [facility];  Service: Gynecology;  Laterality: Bilateral;   TONSILLECTOMY  age 69   TUBAL LIGATION  1976    Prior to Admission medications   Medication Sig Start Date End Date Taking? Authorizing Provider  brimonidine (ALPHAGAN) 0.2 % ophthalmic solution Place into both eyes in the morning and at bedtime.    [provider]    Allergies as of 07/21/2023 - Review Complete 06/19/2023  Allergen Reaction Noted   Amoxicillin Rash 07/14/2015   Hydrocodone-acetaminophen Rash 07/14/2015    Family History  Problem Relation Age of  Onset   Heart disease Mother    Diabetes Mother    Alzheimer's disease Mother    Heart attack Father    Heart disease Father    Skin cancer Father    COPD Father    Hypertension Father    Hypertension Brother    Heart disease Brother    Parkinson's disease Maternal Grandmother    Breast cancer Neg Hx     Social History   Socioeconomic History   Marital status: Widowed    Spouse name: Not on file   Number of children: 2   Years of education: Not on file   Highest education level: Associate degree: occupational, Scientist, product/process development, or vocational program  Occupational History   Occupation: retired  Tobacco Use   Smoking status: Never   Smokeless tobacco: Never  Vaping Use   Vaping status: Never Used  Substance and Sexual Activity   Alcohol use: Yes    Alcohol/week: 7.0 standard drinks of alcohol    Types: 7 Glasses of wine per week    Comment: 1 drink a day   Drug use: No   Sexual activity: Not Currently  Other Topics Concern   Not on file  Social History Narrative   Not on file   Social Determinants of Health   Financial Resource Strain: Low Risk  (02/22/2023)   Overall Financial Resource Strain (CARDIA)    Difficulty of Paying Living Expenses: Not hard at all  Food Insecurity: No Food Insecurity (02/22/2023)   Hunger Vital Sign    Worried About Running Out of Food in the Last Year: Never  true    Ran Out of Food in the Last Year: Never true  Transportation Needs: No Transportation Needs (02/22/2023)   PRAPARE - Administrator, Civil Service (Medical): No    Lack of Transportation (Non-Medical): No  Physical Activity: Insufficiently Active (02/22/2023)   Exercise Vital Sign    Days of Exercise per Week: 4 days    Minutes of Exercise per Session: 30 min  Stress: No Stress Concern Present (01/07/2021)   [employer] of Occupational Health - Occupational Stress Questionnaire    Feeling of Stress : Not at all  Social Connections: Moderately Isolated  (02/22/2023)   Social Connection and Isolation Panel [NHANES]    Frequency of Communication with Friends and Family: More than three times a week    Frequency of Social Gatherings with Friends and Family: More than three times a week    Attends Religious Services: More than 4 times per year    Active Member of [employer] or Organizations: No    Attends Banker Meetings: Never    Marital Status: Widowed  Intimate Partner Violence: Unknown (12/07/2022)   Received from [facility]   HITS    Physically Hurt: Not on file    Insult or Talk Down To: Not on file    Threaten Physical Harm: Not on file    Scream or Curse: Not on file    Review of Systems: See HPI, otherwise negative ROS  Physical Exam: BP (!) 140/69   Pulse (!) 54   Temp (!) 97.5 F (36.4 C) (Temporal)   Resp 14   Ht 5\' 6"  (1.676 m)   Wt 86.5 kg   LMP 10/11/1999 (Approximate)   SpO2 96%   BMI 30.80 kg/m  General:   Alert, cooperative in NAD Head:  Normocephalic and atraumatic. Respiratory:  Normal work of breathing. Cardiovascular:  RRR  Impression/Plan: Mary [name] is here for cataract surgery.  Risks, benefits, limitations, and alternatives regarding cataract surgery have been reviewed with the patient.  Questions have been answered.  All parties agreeable.   Willey Blade, MD  09/11/2023, 11:47 AM

## 2023-09-11 NOTE — Transfer of Care (Signed)
Immediate Anesthesia Transfer of Care Note  Patient: Mary [name]  Procedure(s) Performed: CATARACT EXTRACTION PHACO AND INTRAOCULAR LENS PLACEMENT (IOC) LEFT HYDRUS MICROSTENT  3.12  00:25.1 (Left: Eye)  Patient Location: PACU  Anesthesia Type: No value filed.  Level of Consciousness: awake, alert  and patient cooperative  Airway and Oxygen Therapy: Patient Spontanous Breathing and Patient connected to supplemental oxygen  Post-op Assessment: Post-op Vital signs reviewed, Patient's Cardiovascular Status Stable, Respiratory Function Stable, Patent Airway and No signs of Nausea or vomiting  Post-op Vital Signs: Reviewed and stable  Complications: No notable events documented.

## 2023-09-12 NOTE — Anesthesia Postprocedure Evaluation (Signed)
Anesthesia Post Note  Patient: Mary [name]  Procedure(s) Performed: CATARACT EXTRACTION PHACO AND INTRAOCULAR LENS PLACEMENT (IOC) LEFT HYDRUS MICROSTENT  3.12  00:25.1 (Left: Eye)  Patient location during evaluation: PACU Anesthesia Type: MAC Level of consciousness: awake and alert Pain management: pain level controlled Vital Signs Assessment: post-procedure vital signs reviewed and stable Respiratory status: spontaneous breathing, nonlabored ventilation, respiratory function stable and patient connected to nasal cannula oxygen Cardiovascular status: stable and blood pressure returned to baseline Postop Assessment: no apparent nausea or vomiting Anesthetic complications: no   No notable events documented.   Last Vitals:  Vitals:   09/11/23 1219 09/11/23 1222  BP: (!) 132/96   Pulse: (!) 58 62  Resp: 16 12  Temp:    SpO2: 98% 97%    Last Pain:  Vitals:   09/12/23 0911  TempSrc:   PainSc: 0-No pain                 Yevette Edwards

## 2023-09-12 NOTE — Anesthesia Preprocedure Evaluation (Signed)
Anesthesia Evaluation  Patient identified by MRN, date of birth, ID band Patient awake    Reviewed: Allergy & Precautions, H&P , NPO status , Patient's Chart, lab work & pertinent test results, reviewed documented beta blocker date and time   Airway Mallampati: II  TM Distance: >3 FB Neck ROM: full    Dental no notable dental hx. (+) Teeth Intact   Pulmonary neg pulmonary ROS   Pulmonary exam normal breath sounds clear to auscultation       Cardiovascular Exercise Tolerance: Good hypertension, On Medications negative cardio ROS  Rhythm:regular Rate:Normal     Neuro/Psych negative neurological ROS  negative psych ROS   GI/Hepatic negative GI ROS, Neg liver ROS,,,  Endo/Other  negative endocrine ROSdiabetes, Well Controlled    Renal/GU      Musculoskeletal   Abdominal   Peds  Hematology negative hematology ROS (+)   Anesthesia Other Findings   Reproductive/Obstetrics negative OB ROS                             Anesthesia Physical Anesthesia Plan  ASA: 2  Anesthesia Plan: MAC   Post-op Pain Management:    Induction:   PONV Risk Score and Plan: 2  Airway Management Planned:   Additional Equipment:   Intra-op Plan:   Post-operative Plan:   Informed Consent: I have reviewed the patients History and Physical, chart, labs and discussed the procedure including the risks, benefits and alternatives for the proposed anesthesia with the patient or authorized representative who has indicated his/her understanding and acceptance.       Plan Discussed with: CRNA  Anesthesia Plan Comments:        Anesthesia Quick Evaluation  

## 2023-09-15 ENCOUNTER — Encounter: Payer: Self-pay | Admitting: Ophthalmology

## 2023-10-06 DIAGNOSIS — J0100 Acute maxillary sinusitis, unspecified: Secondary | ICD-10-CM | POA: Diagnosis not present

## 2023-10-13 ENCOUNTER — Ambulatory Visit: Payer: Self-pay | Admitting: *Deleted

## 2023-10-13 NOTE — Telephone Encounter (Signed)
 Agree with evaluation in urgent care clinic prior to flight

## 2023-10-13 NOTE — Telephone Encounter (Signed)
 Message from [place] M sent at 10/13/2023  8:50 AM EST  Summary: Right Ear Pain Advice   Pt is calling to report that [facility]. Still reporting Right Ear pain, with some pressure and congestion. No avialable appts at [facility] or [facility]. Please advise          Call History  Contact Date/Time Type Contact Phone/Fax By  10/13/2023 08:48 AM EST Phone (Incoming) Mary [name], Mary [name] (Self) [phone] MIKE) Rosalina Nest A   Reason for Disposition  Earache  (Exceptions: brief ear pain of < 60 minutes duration, earache occurring during air travel    Since she is going to be flying I recommended she return to the [facility] to have her ear evaluated.  Answer Assessment - Initial Assessment Questions 1. LOCATION: Which ear is involved?     Right ear pain.   I had a cold and was seen at [facility] and was diagnosed with a sinus infection.   Doxycycline given.   It helped a lot but it ran out and I still have the ear pain and sinus congestion.     It's not clearing up.     I'm leaving tomorrow flying to [place]  and going on a cruise.   I tried the Mucinex the first 2 weeks I was sick.   It didn't really help.    No fever.     She does not have hypertension so I recommended she try Sudafed for the congestion.     I let her know, because she asked about the non drowsy formula, there are 2 forms of the Sudafed.   One you have to ask the pharmacist for from behind the counter and the non drowsy formula that she could get OTC.    2. ONSET: When did the ear start hurting      Since seen on 12/[facility]    I suggested she return to the [facility] since she is going to be flying so in case her ear was infected they could get her started on an antibiotic.   I was wondering about having my ear looked at.   I was concerned because I'm going to be flying.    She was agreeable to returning to the [facility].   3. SEVERITY: How  bad is the pain?  (Scale 1-10; mild, moderate or severe)   - MILD (1-3): doesn't interfere with normal activities    - MODERATE (4-7): interferes with normal activities or awakens from sleep    - SEVERE (8-10): excruciating pain, unable to do any normal activities      Moderate 4. URI SYMPTOMS: Do you have a runny nose or cough?     I have sinusitis with congestion, right ear pain/pressure. 5. FEVER: Do you have a fever? If Yes, ask: What is your temperature, how was it measured, and when did it start?     No fever 6. CAUSE: Have you been swimming recently?, How often do you use Q-TIPS?, Have you had any recent air travel or scuba diving?     Had a cold 2 weeks ago that has gone into a sinus infection and now possibly an ear infection per my visit [facility]. 7. OTHER SYMPTOMS: Do you have any other symptoms? (e.g., headache, stiff neck, dizziness, vomiting, runny nose, decreased hearing)     Nasal/sinus congestion, ear pressure and pain 8. PREGNANCY: Is  there any chance you are pregnant? When was your last menstrual period?     N/A due to age  Protocols used: Earache-A-AH

## 2023-10-13 NOTE — Telephone Encounter (Addendum)
  Chief Complaint: Continued sinus congestion and right ear pain/pressure after being seen at [facility] and given Doxycycline.   Got better but now symptoms returned.   She is flying to [place]  tomorrow 10/14/2023 to go on a cruise. Symptoms: Continued sinus pressure/congestion and right ear pain.   Frequency: Since seen 10/06/2023  Pertinent Negatives: Patient denies fever.   Disposition: [] ED /[x] Urgent Care (no appt availability in office) / [] Appointment(In office/virtual)/ []  [facility]/ [] Home Care/ [] Refused Recommended Disposition /[] [facility]/ []  Follow-up with PCP Additional Notes: There are no appts available with [facility] or [facility].  No appts available  virtually with Dr. Myrla either.    She is wanting her ear to be checked in case it's infected before flying tomorrow.    I recommended she return to the [facility] which she was agreeable to doing for an in person evaluation since she will be flying tomorrow.       Virtual visits with Dr. Myrla also booked.  I checked in case she preferred this option but she wanted to be evaluated in person.

## 2024-02-14 ENCOUNTER — Ambulatory Visit: Payer: Self-pay

## 2024-02-14 DIAGNOSIS — Z0000 Encounter for general adult medical examination without abnormal findings: Secondary | ICD-10-CM

## 2024-02-14 DIAGNOSIS — Z1231 Encounter for screening mammogram for malignant neoplasm of breast: Secondary | ICD-10-CM

## 2024-02-14 NOTE — Progress Notes (Signed)
 Subjective:   Mary [name] is a 73 y.o. who presents for a Medicare Wellness preventive visit.  Visit Complete: Virtual I connected with  Mary [name] on 02/14/24 by a audio enabled telemedicine application and verified that I am speaking with the correct person using two identifiers.  Patient Location: Home  Provider Location: Home Office  I discussed the limitations of evaluation and management by telemedicine. The patient expressed understanding and agreed to proceed.  Vital Signs: Because this visit was a virtual/telehealth visit, some criteria may be missing or patient reported. Any vitals not documented were not able to be obtained and vitals that have been documented are patient reported.  VideoDeclined- This patient declined Librarian, academic. Therefore the visit was completed with audio only.  Persons Participating in Visit: Patient.  AWV Questionnaire: No: Patient Medicare AWV questionnaire was not completed prior to this visit.  Cardiac Risk Factors include: advanced age (>65men, >97 women);dyslipidemia;sedentary lifestyle;obesity (BMI >30kg/m2)     Objective:    There were no vitals filed for this visit. There is no height or weight on file to calculate BMI.     02/14/2024    9:40 AM 09/11/2023   10:54 AM 08/28/2023    8:10 AM 02/22/2023    9:53 AM 03/10/2022    8:19 AM 01/07/2021    1:59 PM 05/05/2020    6:15 AM  Advanced Directives  Does Patient Have a Medical Advance Directive? Yes Yes Yes Yes Yes Yes Yes  Type of Estate agent of [place];Living will Healthcare Power of [place];Living will Healthcare Power of [place];Living will Healthcare Power of [place];Living will Healthcare Power of [place];Living will Healthcare Power of [place];Living will Healthcare Power of [place];Living will  Does patient want to make changes to medical advance directive? No - Patient declined No - Patient declined No - Patient  declined    No - Patient declined  Copy of Healthcare Power of Attorney in Chart? Yes - validated most recent copy scanned in chart (See row information) Yes - validated most recent copy scanned in chart (See row information)   No - copy requested No - copy requested No - copy requested  Would patient like information on creating a medical advance directive?     No - Patient declined      Current Medications (verified) Outpatient Encounter Medications as of 02/14/2024  Medication Sig   brimonidine (ALPHAGAN) 0.2 % ophthalmic solution Place into both eyes in the morning and at bedtime. (Patient not taking: Reported on 02/14/2024)   No facility-administered encounter medications on file as of 02/14/2024.    Allergies (verified) Cosopt [dorzolamide hcl-timolol mal], Dorzolamide, Timolol, Amoxicillin, Hydrocodone-acetaminophen , Latanoprost, and Simbrinza [brinzolamide-brimonidine]   History: Past Medical History:  Diagnosis Date   Adnexal mass    Elevated ALT measurement    History of blood transfusion 1973   Preglaucoma    Vagal reaction    strong vagal response to pain   Past Surgical History:  Procedure Laterality Date   CATARACT EXTRACTION W/PHACO Right 08/28/2023   Procedure: CATARACT EXTRACTION PHACO AND INTRAOCULAR LENS PLACEMENT (IOC) RIGHT HYDRUS MICROSTENT;  Surgeon: Rosa College, MD;  Location: [facility];  Service: Ophthalmology;  Laterality: Right;  3.19 0:26.4   CATARACT EXTRACTION W/PHACO Left 09/11/2023   Procedure: CATARACT EXTRACTION PHACO AND INTRAOCULAR LENS PLACEMENT (IOC) LEFT HYDRUS MICROSTENT  3.12  00:25.1;  Surgeon: Rosa College, MD;  Location: [facility];  Service: Ophthalmology;  Laterality: Left;  COLONOSCOPY WITH PROPOFOL  N/A 11/19/2018   Procedure: COLONOSCOPY WITH PROPOFOL ;  Surgeon: Cassie Click, MD;  Location: [facility];  Service: Endoscopy;  Laterality: N/A;   COLONOSCOPY WITH PROPOFOL  N/A 03/10/2022   Procedure:  COLONOSCOPY WITH PROPOFOL ;  Surgeon: Luke Salaam, MD;  Location: [facility];  Service: Gastroenterology;  Laterality: N/A;   EXCISION OF ADNEXAL MASS     LAPAROSCOPIC BILATERAL SALPINGO OOPHERECTOMY Bilateral 05/05/2020   Procedure: LAPAROSCOPIC BILATERAL SALPINGO OOPHORECTOMY WITH COLLECTION OF PELVIC WASHINGS AND LYSIS OF ADHEASIONS;  Surgeon: Greta Leatherwood, MD;  Location: [facility];  Service: Gynecology;  Laterality: Bilateral;   TONSILLECTOMY  age 39   TUBAL LIGATION  1976   Family History  Problem Relation Age of Onset   Heart disease Mother    Diabetes Mother    Alzheimer's disease Mother    Heart attack Father    Heart disease Father    Skin cancer Father    COPD Father    Hypertension Father    Hypertension Brother    Heart disease Brother    Parkinson's disease Maternal Grandmother    Breast cancer Neg Hx    Social History   Socioeconomic History   Marital status: Widowed    Spouse name: Not on file   Number of children: 2   Years of education: Not on file   Highest education level: Associate degree: occupational, Scientist, product/process development, or vocational program  Occupational History   Occupation: retired  Tobacco Use   Smoking status: Never   Smokeless tobacco: Never  Vaping Use   Vaping status: Never Used  Substance and Sexual Activity   Alcohol use: Yes    Alcohol/week: 7.0 standard drinks of alcohol    Types: 7 Glasses of wine per week    Comment: 1 drink a day   Drug use: No   Sexual activity: Not Currently  Other Topics Concern   Not on file  Social History Narrative   Not on file   Social Drivers of Health   Financial Resource Strain: Low Risk  (02/14/2024)   Overall Financial Resource Strain (CARDIA)    Difficulty of Paying Living Expenses: Not hard at all  Food Insecurity: No Food Insecurity (02/14/2024)   Hunger Vital Sign    Worried About Running Out of Food in the Last Year: Never true    Ran Out of Food in the Last Year:  Never true  Transportation Needs: No Transportation Needs (02/14/2024)   PRAPARE - Administrator, Civil Service (Medical): No    Lack of Transportation (Non-Medical): No  Physical Activity: Insufficiently Active (02/14/2024)   Exercise Vital Sign    Days of Exercise per Week: 3 days    Minutes of Exercise per Session: 20 min  Stress: No Stress Concern Present (02/14/2024)   [employer] of Occupational Health - Occupational Stress Questionnaire    Feeling of Stress : Not at all  Social Connections: Moderately Isolated (02/14/2024)   Social Connection and Isolation Panel [NHANES]    Frequency of Communication with Friends and Family: More than three times a week    Frequency of Social Gatherings with Friends and Family: More than three times a week    Attends Religious Services: More than 4 times per year    Active Member of [employer] or Organizations: No    Attends Banker Meetings: Never    Marital Status: Widowed    Tobacco Counseling Counseling given: Not Answered  Clinical Intake:  Pre-visit preparation completed: Yes  Pain : No/denies pain     BMI - recorded: 31 Nutritional Status: BMI > 30  Obese Nutritional Risks: None Diabetes: No  Lab Results  Component Value Date   HGBA1C 5.5 10/15/2021   HGBA1C 5.5 12/27/2012     How often do you need to have someone help you when you read instructions, pamphlets, or other written materials from your doctor or pharmacy?: 1 - Never  Interpreter Needed?: No  Information entered by :: Dellie Fergusson, LPN   Activities of Daily Living    02/14/2024    9:42 AM 09/11/2023   10:54 AM  In your present state of health, do you have any difficulty performing the following activities:  Hearing? 1 0  Vision? 0 0  Difficulty concentrating or making decisions? 0 0  Walking or climbing stairs? 0   Dressing or bathing? 0   Doing errands, shopping? 0   Preparing Food and eating ? N   Using the Toilet? N   In  the past six months, have you accidently leaked urine? Y   Do you have problems with loss of bowel control? N   Managing your Medications? N   Managing your Finances? N   Housekeeping or managing your Housekeeping? N     Patient Care Team: Mimi Alt, MD as PCP - General (Family Medicine) Mariane Shire, MD (Dermatology) Greta Leatherwood, MD as Consulting Physician (Obstetrics and Gynecology) Rosa College, MD as Consulting Physician (Ophthalmology)  Indicate any recent Medical Services you may have received from other than [facility] providers in the past year (date may be approximate).     Assessment:   This is a routine wellness examination for Analiza.  Hearing/Vision screen Hearing Screening - Comments:: WEARS AIDS, BOTH EARS Vision Screening - Comments:: WEARS GLASSES ALL THE DAY- [facility]   Goals Addressed             This Visit's Progress    DIET - EAT MORE FRUITS AND VEGETABLES         Depression Screen     02/14/2024    9:39 AM 09/14/2022    4:07 PM 10/15/2021    9:06 AM 01/07/2021    1:58 PM 09/17/2019    9:27 AM 08/29/2018    2:56 PM 12/26/2016    8:38 AM  PHQ 2/9 Scores  PHQ - 2 Score 0 0 1 0 0 2 0  PHQ- 9 Score 0 3 4   7  0    Fall Risk     02/14/2024    9:41 AM 02/22/2023    9:59 AM 10/15/2021    9:05 AM 01/07/2021    1:59 PM 09/17/2019    9:27 AM  Fall Risk   Falls in the past year? 1 0 0 0 0  Number falls in past yr: 0 0 0 0 0  Injury with Fall? 0 0 0 0 0  Risk for fall due to :  No Fall Risks     Follow up Falls prevention discussed;Falls evaluation completed Falls prevention discussed;Education provided       MEDICARE RISK AT HOME:  Medicare Risk at Home Any stairs in or around the home?: Yes If so, are there any without handrails?: No Home free of loose throw rugs in walkways, pet beds, electrical cords, etc?: Yes Adequate lighting in your home to reduce risk of falls?: Yes Life alert?: No Use of a cane, walker  or  w/c?: No Grab bars in the bathroom?: Yes Shower chair or bench in shower?: No Elevated toilet seat or a handicapped toilet?: Yes  TIMED UP AND GO:  Was the test performed?  No  Cognitive Function: 6CIT completed        02/14/2024    9:43 AM 02/22/2023    9:56 AM  6CIT Screen  What Year? 0 points 0 points  What month? 0 points 0 points  What time? 0 points 0 points  Count back from 20 0 points 0 points  Months in reverse 0 points 0 points  Repeat phrase 0 points 0 points  Total Score 0 points 0 points    Immunizations Immunization History  Administered Date(s) Administered   Fluad Quad(high Dose 65+) 07/09/2019, 09/10/2022   Influenza, High Dose Seasonal PF 07/18/2018   Influenza-Unspecified 07/22/2016, 09/20/2021   PFIZER(Purple Top)SARS-COV-2 Vaccination 11/23/2019, 12/14/2019, 06/17/2020, 09/20/2021   Pneumococcal Conjugate-13 12/26/2016   Pneumococcal Polysaccharide-23 07/18/2018   Td 05/31/2005   Tdap 02/01/2010   Zoster, Live 08/13/2015    Screening Tests Health Maintenance  Topic Date Due   Zoster Vaccines- Shingrix (1 of 2) 03/19/2001   DTaP/Tdap/Td (3 - Td or Tdap) 02/02/2020   MAMMOGRAM  05/12/2022   COVID-19 Vaccine (5 - 2024-25 season) 06/11/2023   INFLUENZA VACCINE  05/10/2024   Medicare Annual Wellness (AWV)  02/13/2025   Colonoscopy  03/10/2025   DEXA SCAN  02/03/2026   Pneumonia Vaccine 63+ Years old  Completed   Hepatitis C Screening  Completed   HPV VACCINES  Aged Out   Meningococcal B Vaccine  Aged Out    Health Maintenance  Health Maintenance Due  Topic Date Due   Zoster Vaccines- Shingrix (1 of 2) 03/19/2001   DTaP/Tdap/Td (3 - Td or Tdap) 02/02/2020   MAMMOGRAM  05/12/2022   COVID-19 Vaccine (5 - 2024-25 season) 06/11/2023   Health Maintenance Items Addressed: Mammogram ordered. UP TO DATE ON COLONOSCOPY & BDS; NEEDS TDAP AND PNA, NEW SHINGRIX  Additional Screening:  Vision Screening: Recommended annual ophthalmology exams  for early detection of glaucoma and other disorders of the eye.  Dental Screening: Recommended annual dental exams for proper oral hygiene  Community Resource Referral / Chronic Care Management: CRR required this visit?  No   CCM required this visit?  No     Plan:     I have personally reviewed and noted the following in the patient's chart:   Medical and social history Use of alcohol, tobacco or illicit drugs  Current medications and supplements including opioid prescriptions. Patient is not currently taking opioid prescriptions. Functional ability and status Nutritional status Physical activity Advanced directives List of other physicians Hospitalizations, surgeries, and ER visits in previous 12 months Vitals Screenings to include cognitive, depression, and falls Referrals and appointments  In addition, I have reviewed and discussed with patient certain preventive protocols, quality metrics, and best practice recommendations. A written personalized care plan for preventive services as well as general preventive health recommendations were provided to patient.     Pinky Bright, LPN   12/10/2949   After Visit Summary: (MyChart) Due to this being a telephonic visit, the after visit summary with patients personalized plan was offered to patient via MyChart   Notes: MAMMOGRAM ORDERED

## 2024-02-14 NOTE — Patient Instructions (Addendum)
 Mary [name] , Thank you for taking time to come for your Medicare Wellness Visit. I appreciate your ongoing commitment to your health goals. Please review the following plan we discussed and let me know if I can assist you in the future.   Referrals/Orders/Follow-Ups/Clinician Recommendations: MAMMOGRAM ORDERED  You have an order for:  []   2D Mammogram  [x]   3D Mammogram  []   Bone Density     Please call for appointment:  [facility]  [address] [place] [address] [phone] [facility] [address] [place], [place] [address] [phone] [facility] [address] Tracey Friday [place], [place] [address] [phone]   Make sure to wear two-piece clothing.  No lotions, powders, or deodorants the day of the appointment. Make sure to bring picture ID and insurance card.  Bring list of medications you are currently taking including any supplements.   Schedule your [place] screening mammogram through MyChart!   Log into your MyChart account.  Go to 'Visit' (or 'Appointments' if on mobile App) --> Schedule an Appointment  Under 'Select a Reason for Visit' choose the Mammogram Screening option.  Complete the pre-visit questions and select the time and place that best fits your schedule.   This is a list of the screening recommended for you and due dates:  Health Maintenance  Topic Date Due   Zoster (Shingles) Vaccine (1 of 2) 03/19/2001   DTaP/Tdap/Td vaccine (3 - Td or Tdap) 02/02/2020   Mammogram  05/12/2022   COVID-19 Vaccine (5 - 2024-25 season) 06/11/2023   Flu Shot  05/10/2024   Medicare Annual Wellness Visit  02/13/2025   Colon Cancer Screening  03/10/2025   DEXA scan (bone density measurement)  02/03/2026   Pneumonia Vaccine  Completed   Hepatitis C Screening  Completed   HPV Vaccine  Aged Out   Meningitis B Vaccine  Aged Out    Advanced  directives: (In Chart) A copy of your advanced directives are scanned into your chart should your provider ever need it.  Next Medicare Annual Wellness Visit scheduled for next year: Yes  02/25/25 @ 8:50 AM BY PHONE  Have you seen your provider in the last 6 months (3 months if uncontrolled diabetes)? No

## 2024-03-25 ENCOUNTER — Encounter: Admitting: Family Medicine

## 2024-04-02 ENCOUNTER — Ambulatory Visit (INDEPENDENT_AMBULATORY_CARE_PROVIDER_SITE_OTHER): Admitting: Family Medicine

## 2024-04-02 ENCOUNTER — Encounter: Payer: Self-pay | Admitting: Family Medicine

## 2024-04-02 VITALS — BP 122/58 | HR 45 | Ht 66.0 in | Wt 188.0 lb

## 2024-04-02 DIAGNOSIS — Z0000 Encounter for general adult medical examination without abnormal findings: Secondary | ICD-10-CM | POA: Insufficient documentation

## 2024-04-02 DIAGNOSIS — R748 Abnormal levels of other serum enzymes: Secondary | ICD-10-CM | POA: Diagnosis not present

## 2024-04-02 DIAGNOSIS — Z130 Encounter for screening for diseases of the blood and blood-forming organs and certain disorders involving the immune mechanism: Secondary | ICD-10-CM

## 2024-04-02 DIAGNOSIS — R030 Elevated blood-pressure reading, without diagnosis of hypertension: Secondary | ICD-10-CM

## 2024-04-02 DIAGNOSIS — E7849 Other hyperlipidemia: Secondary | ICD-10-CM

## 2024-04-02 DIAGNOSIS — Z1231 Encounter for screening mammogram for malignant neoplasm of breast: Secondary | ICD-10-CM

## 2024-04-02 DIAGNOSIS — R252 Cramp and spasm: Secondary | ICD-10-CM

## 2024-04-02 DIAGNOSIS — Z131 Encounter for screening for diabetes mellitus: Secondary | ICD-10-CM

## 2024-04-02 DIAGNOSIS — E559 Vitamin D deficiency, unspecified: Secondary | ICD-10-CM

## 2024-04-02 NOTE — Patient Instructions (Signed)
 It was a pleasure to see you today!  Thank you for choosing [facility] for your primary care.   Today you were seen for your annual physical  Please review the attached information regarding helpful preventive health topics.   To keep you healthy, please keep in mind the following health maintenance items that you are due for:   Health Maintenance Due  Topic Date Due   Zoster Vaccines- Shingrix (1 of 2) 03/19/2001   DTaP/Tdap/Td (3 - Td or Tdap) 02/02/2020   MAMMOGRAM  05/12/2022   COVID-19 Vaccine (5 - 2024-25 season) 06/11/2023     Best Wishes,   Dr. Lang

## 2024-04-02 NOTE — Progress Notes (Signed)
 Complete physical exam   Patient: Mary [name]   DOB: Jun 12, 1951   73 y.o. Female  MRN: [id number] Visit Date: 04/02/2024  Today's healthcare provider: Rockie Agent, MD   Chief Complaint  Patient presents with   Annual Exam   Care Management    Pattern of eating: General   Are you exercising; No but work outside in her garden   Sleep: Normal   Vaccine: Agreed    Subjective    Mary [name] is a 73 y.o. female who presents today for a complete physical exam.     Discussed the use of AI scribe software for clinical note transcription with the patient, who gave verbal consent to proceed.  History of Present Illness Mary [name] is a 73 year old female who presents for an annual physical exam.  She experiences cramps in her legs, though details regarding frequency, duration, or severity are not provided. No other specific symptoms were discussed.  She maintains a general diet and does not engage in regular exercise, although she works in her garden. She previously participated in Zumba classes but has not had time due to retirement and family responsibilities.  Her family history includes a brother who passed away after a stroke and a sister who has Parkinson's disease and recently underwent back surgery. She has been involved in caregiving for her family members.  She is planning to get a COVID vaccine this year and has tolerated them well in the past.     Past Medical History:  Diagnosis Date   Adnexal mass    Elevated ALT measurement    History of blood transfusion 1973   Preglaucoma    Vagal reaction    strong vagal response to pain   Past Surgical History:  Procedure Laterality Date   CATARACT EXTRACTION W/PHACO Right 08/28/2023   Procedure: CATARACT EXTRACTION PHACO AND INTRAOCULAR LENS PLACEMENT (IOC) RIGHT HYDRUS MICROSTENT;  Surgeon: Myrna Adine Anes, MD;  Location: [facility];  Service: Ophthalmology;  Laterality: Right;   3.19 0:26.4   CATARACT EXTRACTION W/PHACO Left 09/11/2023   Procedure: CATARACT EXTRACTION PHACO AND INTRAOCULAR LENS PLACEMENT (IOC) LEFT HYDRUS MICROSTENT  3.12  00:25.1;  Surgeon: Myrna Adine Anes, MD;  Location: [facility];  Service: Ophthalmology;  Laterality: Left;   COLONOSCOPY WITH PROPOFOL  N/A 11/19/2018   Procedure: COLONOSCOPY WITH PROPOFOL ;  Surgeon: Viktoria Lamar DASEN, MD;  Location: [facility];  Service: Endoscopy;  Laterality: N/A;   COLONOSCOPY WITH PROPOFOL  N/A 03/10/2022   Procedure: COLONOSCOPY WITH PROPOFOL ;  Surgeon: Therisa Bi, MD;  Location: [facility];  Service: Gastroenterology;  Laterality: N/A;   EXCISION OF ADNEXAL MASS     LAPAROSCOPIC BILATERAL SALPINGO OOPHERECTOMY Bilateral 05/05/2020   Procedure: LAPAROSCOPIC BILATERAL SALPINGO OOPHORECTOMY WITH COLLECTION OF PELVIC WASHINGS AND LYSIS OF ADHEASIONS;  Surgeon: Cathlyn JAYSON Nikki Bobie FORBES, MD;  Location: [facility];  Service: Gynecology;  Laterality: Bilateral;   TONSILLECTOMY  age 69   TUBAL LIGATION  1976   Social History   Socioeconomic History   Marital status: Widowed    Spouse name: Not on file   Number of children: 2   Years of education: Not on file   Highest education level: Associate degree: occupational, Scientist, product/process development, or vocational program  Occupational History   Occupation: retired  Tobacco Use   Smoking status: Never   Smokeless tobacco: Never  Vaping Use   Vaping status: Never Used  Substance and Sexual Activity   Alcohol  use: Yes    Alcohol/week: 7.0 standard drinks of alcohol    Types: 7 Glasses of wine per week    Comment: 1 drink a day   Drug use: No   Sexual activity: Not Currently  Other Topics Concern   Not on file  Social History Narrative   Not on file   Social Drivers of Health   Financial Resource Strain: Low Risk  (02/14/2024)   Overall Financial Resource Strain (CARDIA)    Difficulty of Paying Living Expenses: Not hard at all  Food  Insecurity: No Food Insecurity (02/14/2024)   Hunger Vital Sign    Worried About Running Out of Food in the Last Year: Never true    Ran Out of Food in the Last Year: Never true  Transportation Needs: No Transportation Needs (02/14/2024)   PRAPARE - Administrator, Civil Service (Medical): No    Lack of Transportation (Non-Medical): No  Physical Activity: Insufficiently Active (02/14/2024)   Exercise Vital Sign    Days of Exercise per Week: 3 days    Minutes of Exercise per Session: 20 min  Stress: No Stress Concern Present (02/14/2024)   [employer] of Occupational Health - Occupational Stress Questionnaire    Feeling of Stress : Not at all  Social Connections: Moderately Isolated (02/14/2024)   Social Connection and Isolation Panel    Frequency of Communication with Friends and Family: More than three times a week    Frequency of Social Gatherings with Friends and Family: More than three times a week    Attends Religious Services: More than 4 times per year    Active Member of [employer] or Organizations: No    Attends Banker Meetings: Never    Marital Status: Widowed  Intimate Partner Violence: Not At Risk (02/14/2024)   Humiliation, Afraid, Rape, and Kick questionnaire    Fear of Current or Ex-Partner: No    Emotionally Abused: No    Physically Abused: No    Sexually Abused: No   Family Status  Relation Name Status   Mother  Deceased at age 45       stents/alzheimer's   Father  Deceased       A-fib   Brother  Alive   MGM  Deceased   Sister  Alive       SVT/ablation   MGF  Deceased   PGF  Deceased   PGM  Deceased   Neg Hx  (Not Specified)  No partnership data on file   Family History  Problem Relation Age of Onset   Heart disease Mother    Diabetes Mother    Alzheimer's disease Mother    Heart attack Father    Heart disease Father    Skin cancer Father    COPD Father    Hypertension Father    Hypertension Brother    Heart disease Brother     Parkinson's disease Maternal Grandmother    Breast cancer Neg Hx    Allergies  Allergen Reactions   Cosopt [Dorzolamide Hcl-Timolol Mal]     Hair falls out   Dorzolamide     Burning sensation in eye   Timolol     Burning sensation in eye    Amoxicillin Rash   Hydrocodone-Acetaminophen  Rash   Latanoprost Itching and Rash    Blisters   Simbrinza [Brinzolamide-Brimonidine] Itching and Rash    Blisters. Felt like poison oak     Medications: Outpatient Medications Prior to Visit  Medication Sig  brimonidine (ALPHAGAN) 0.2 % ophthalmic solution Place into both eyes in the morning and at bedtime. (Patient not taking: Reported on 04/02/2024)   No facility-administered medications prior to visit.    Review of Systems  Last CBC Lab Results  Component Value Date   WBC 5.8 09/25/2022   HGB 14.5 09/25/2022   HCT 43.0 09/25/2022   MCV 89.4 09/25/2022   MCH 30.1 09/25/2022   RDW 12.8 09/25/2022   PLT 283 09/25/2022   Last metabolic panel Lab Results  Component Value Date   GLUCOSE 100 (H) 09/26/2022   NA 141 09/26/2022   K 4.7 09/26/2022   CL 104 09/26/2022   CO2 21 09/26/2022   BUN 14 09/26/2022   CREATININE 1.01 (H) 09/26/2022   EGFR 60 09/26/2022   CALCIUM 9.7 09/26/2022   PROT 7.1 09/26/2022   ALBUMIN 4.6 09/26/2022   LABGLOB 2.5 09/26/2022   AGRATIO 1.8 09/26/2022   BILITOT 0.6 09/26/2022   ALKPHOS 66 09/26/2022   AST 58 (H) 09/26/2022   ALT 74 (H) 09/26/2022   ANIONGAP 10 09/25/2022   Last lipids Lab Results  Component Value Date   CHOL 196 10/15/2021   HDL 50 10/15/2021   LDLCALC 115 (H) 10/15/2021   TRIG 178 (H) 10/15/2021   CHOLHDL 3.9 10/15/2021   Last hemoglobin A1c Lab Results  Component Value Date   HGBA1C 5.5 10/15/2021   Last thyroid  functions Lab Results  Component Value Date   TSH 1.230 03/25/2021   Last vitamin D Lab Results  Component Value Date   VD25OH 28.6 (L) 10/15/2021   Last vitamin B12 and Folate No results found  for: VITAMINB12, FOLATE     Objective    BP (!) 122/58   Pulse (!) 45   Ht 5' 6 (1.676 m)   Wt 188 lb (85.3 kg)   LMP 10/11/1999 (Approximate)   SpO2 100%   BMI 30.34 kg/m  BP Readings from Last 3 Encounters:  04/02/24 (!) 122/58  09/11/23 (!) 132/96  08/28/23 128/66   Wt Readings from Last 3 Encounters:  04/02/24 188 lb (85.3 kg)  09/11/23 190 lb 12.8 oz (86.5 kg)  08/28/23 193 lb (87.5 kg)        Physical Exam Vitals reviewed.  Constitutional:      General: She is not in acute distress.    Appearance: Normal appearance. She is not ill-appearing, toxic-appearing or diaphoretic.  HENT:     Head: Normocephalic and atraumatic.     Right Ear: Tympanic membrane and external ear normal. There is no impacted cerumen.     Left Ear: Tympanic membrane and external ear normal. There is no impacted cerumen.     Nose: Nose normal.     Mouth/Throat:     Pharynx: Oropharynx is clear.   Eyes:     General: No scleral icterus.    Extraocular Movements: Extraocular movements intact.     Conjunctiva/sclera: Conjunctivae normal.     Pupils: Pupils are equal, round, and reactive to light.    Cardiovascular:     Rate and Rhythm: Normal rate and regular rhythm.     Pulses: Normal pulses.     Heart sounds: Normal heart sounds. No murmur heard.    No friction rub. No gallop.  Pulmonary:     Effort: Pulmonary effort is normal. No respiratory distress.     Breath sounds: Normal breath sounds. No wheezing, rhonchi or rales.  Abdominal:     General: Bowel sounds are normal. There is  no distension.     Palpations: Abdomen is soft. There is no mass.     Tenderness: There is no abdominal tenderness. There is no guarding.   Musculoskeletal:        General: No deformity.     Cervical back: Normal range of motion and neck supple.     Right lower leg: No edema.     Left lower leg: No edema.  Lymphadenopathy:     Cervical: No cervical adenopathy.   Skin:    General: Skin is warm.      Capillary Refill: Capillary refill takes less than 2 seconds.     Findings: No erythema or rash.   Neurological:     General: No focal deficit present.     Mental Status: She is alert and oriented to person, place, and time.     Cranial Nerves: Cranial nerves 2-12 are intact. No cranial nerve deficit or facial asymmetry.     Motor: Motor function is intact. No weakness.     Gait: Gait normal.   Psychiatric:        Mood and Affect: Mood normal.        Behavior: Behavior normal.       Last depression screening scores    04/02/2024    1:50 PM 02/14/2024    9:39 AM 09/14/2022    4:07 PM  PHQ 2/9 Scores  PHQ - 2 Score 0 0 0  PHQ- 9 Score 1 0 3    Last fall risk screening    02/14/2024    9:41 AM  Fall Risk   Falls in the past year? 1  Number falls in past yr: 0  Injury with Fall? 0  Follow up Falls prevention discussed;Falls evaluation completed    Last Audit-C alcohol use screening    02/14/2024    9:38 AM  Alcohol Use Disorder Test (AUDIT)  1. How often do you have a drink containing alcohol? 4  2. How many drinks containing alcohol do you have on a typical day when you are drinking? 0  3. How often do you have six or more drinks on one occasion? 0  AUDIT-C Score 4  4. How often during the last year have you found that you were not able to stop drinking once you had started? 0  5. How often during the last year have you failed to do what was normally expected from you because of drinking? 0  6. How often during the last year have you needed a first drink in the morning to get yourself going after a heavy drinking session? 0  7. How often during the last year have you had a feeling of guilt of remorse after drinking? 0  8. How often during the last year have you been unable to remember what happened the night before because you had been drinking? 0  9. Have you or someone else been injured as a result of your drinking? 0  10. Has a relative or friend or a doctor or  another health worker been concerned about your drinking or suggested you cut down? 0  Alcohol Use Disorder Identification Test Final Score (AUDIT) 4   A score of 3 or more in women, and 4 or more in men indicates increased risk for alcohol abuse, EXCEPT if all of the points are from question 1   No results found for any visits on 04/02/24.  Assessment & Plan    Routine Health Maintenance and Physical Exam  Immunization History  Administered Date(s) Administered   Fluad Quad(high Dose 65+) 07/09/2019, 09/10/2022   Influenza, High Dose Seasonal PF 07/18/2018, 08/12/2023   Influenza-Unspecified 07/22/2016, 09/20/2021   Moderna Covid-19 Fall Seasonal Vaccine 65yrs & older 08/12/2023   PFIZER(Purple Top)SARS-COV-2 Vaccination 11/23/2019, 12/14/2019, 06/17/2020, 07/07/2020, 09/20/2021   Pneumococcal Conjugate-13 12/26/2016   Pneumococcal Polysaccharide-23 07/18/2018   Td 05/31/2005   Tdap 02/01/2010   Zoster, Live 08/13/2015    Health Maintenance  Topic Date Due   Zoster Vaccines- Shingrix (1 of 2) 03/19/2001   DTaP/Tdap/Td (3 - Td or Tdap) 02/02/2020   MAMMOGRAM  05/12/2022   COVID-19 Vaccine (7 - 2024-25 season) 02/09/2024   INFLUENZA VACCINE  05/10/2024   Medicare Annual Wellness (AWV)  02/13/2025   Colonoscopy  03/10/2025   DEXA SCAN  02/03/2026   Pneumococcal Vaccine: 50+ Years  Completed   Hepatitis C Screening  Completed   Hepatitis B Vaccines  Aged Out   HPV VACCINES  Aged Out   Meningococcal B Vaccine  Aged Out    Problem List Items Addressed This Visit       Other   HLD (hyperlipidemia)   Elevated BP without diagnosis of hypertension   Avitaminosis D   Annual physical exam - Primary   Abnormal liver enzymes    Assessment and Plan Assessment & Plan Leg cramps Experiencing leg cramps, warranting further evaluation for potential underlying issues, including magnesium deficiency. - Order blood work to evaluate leg cramps, including magnesium  levels.  Abnormal liver enzymes Previously noted slight elevation in liver enzymes, necessitating re-evaluation to monitor changes. - Recheck liver enzyme levels.  General Health Maintenance Annual physical examination. She maintains a general diet, engages in gardening but not regular exercise. Agrees to Shingrix and tetanus vaccinations. Recommended 150 minutes of exercise weekly, mammogram for breast cancer screening, and laboratory tests including vitamin D levels, lipid panel, CMP, CBC, and A1c. Plans to receive a COVID vaccine this year. Advised to obtain Shingrix and tetanus vaccinations at the pharmacy due to Medicare coverage to avoid higher costs at the clinic. - Administer Shingrix and tetanus vaccinations at the pharmacy. - Recommend 150 minutes of exercise weekly. - Order mammogram for breast cancer screening. - Order laboratory tests: vitamin D levels, lipid panel, CMP, CBC, and A1c. - Plan for COVID vaccination.  Follow-up Typically seen at least twice a year, advised to follow up as needed based on blood work results. - Follow up as needed based on blood work results.       No follow-ups on file.       Rockie Agent, MD  [facility] [phone] (phone) [phone] (fax)  [facility]

## 2024-04-03 LAB — CMP14+EGFR
ALT: 43 IU/L — ABNORMAL HIGH (ref 0–32)
AST: 33 IU/L (ref 0–40)
Albumin: 4.7 g/dL (ref 3.8–4.8)
Alkaline Phosphatase: 65 IU/L (ref 44–121)
BUN/Creatinine Ratio: 18 (ref 12–28)
BUN: 17 mg/dL (ref 8–27)
Bilirubin Total: 0.5 mg/dL (ref 0.0–1.2)
CO2: 22 mmol/L (ref 20–29)
Calcium: 9.5 mg/dL (ref 8.7–10.3)
Chloride: 105 mmol/L (ref 96–106)
Creatinine, Ser: 0.93 mg/dL (ref 0.57–1.00)
Globulin, Total: 2.3 g/dL (ref 1.5–4.5)
Glucose: 84 mg/dL (ref 70–99)
Potassium: 5 mmol/L (ref 3.5–5.2)
Sodium: 143 mmol/L (ref 134–144)
Total Protein: 7 g/dL (ref 6.0–8.5)
eGFR: 65 mL/min/{1.73_m2} (ref >59)

## 2024-04-03 LAB — LIPID PANEL
Chol/HDL Ratio: 3.4 ratio (ref 0.0–4.4)
Cholesterol, Total: 169 mg/dL (ref 100–199)
HDL: 49 mg/dL (ref >39)
LDL Chol Calc (NIH): 98 mg/dL (ref 0–99)
Triglycerides: 125 mg/dL (ref 0–149)
VLDL Cholesterol Cal: 22 mg/dL (ref 5–40)

## 2024-04-03 LAB — CBC
Hematocrit: 42.2 % (ref 34.0–46.6)
Hemoglobin: 13.3 g/dL (ref 11.1–15.9)
MCH: 29.6 pg (ref 26.6–33.0)
MCHC: 31.5 g/dL (ref 31.5–35.7)
MCV: 94 fL (ref 79–97)
Platelets: 269 10*3/uL (ref 150–450)
RBC: 4.49 x10E6/uL (ref 3.77–5.28)
RDW: 13.4 % (ref 11.7–15.4)
WBC: 6 10*3/uL (ref 3.4–10.8)

## 2024-04-03 LAB — HEMOGLOBIN A1C
Est. average glucose Bld gHb Est-mCnc: 111 mg/dL
Hgb A1c MFr Bld: 5.5 % (ref 4.8–5.6)

## 2024-04-03 LAB — VITAMIN D 25 HYDROXY (VIT D DEFICIENCY, FRACTURES): Vit D, 25-Hydroxy: 37.8 ng/mL (ref 30.0–100.0)

## 2024-04-03 LAB — MAGNESIUM: Magnesium: 2.3 mg/dL (ref 1.6–2.3)

## 2024-04-05 ENCOUNTER — Ambulatory Visit: Payer: Self-pay | Admitting: Family Medicine

## 2024-05-29 ENCOUNTER — Ambulatory Visit: Payer: Self-pay | Admitting: *Deleted

## 2024-05-29 NOTE — Telephone Encounter (Signed)
 Pt has been scheduled to see Dr Donzella on 9/9

## 2024-05-29 NOTE — Telephone Encounter (Signed)
 Message from Lauren C sent at 05/29/2024 11:40 AM EDT  Pt experiencing knee pain (not worsening, but not getting better) when she moves in a certain way for several months. Unable to make appt time that is available today with her PCP, next avail in sept.    Call History  Contact Date/Time Type Contact Phone/Fax By  05/29/2024 11:30 AM EDT Phone (Incoming) [name], Mary Viramontes (Self) [phone] [place]   Reason for Disposition  Knee pain is a chronic symptom (recurrent or ongoing AND present > 4 weeks)  Answer Assessment - Initial Assessment Questions 1. LOCATION and RADIATION: Where is the pain located?      I'm having pain in my knee.   When I came in for my physical it was hurting then.   It's not preventing me from doing anything I normally do.   It does have a sore place.   I'm concerned since it has been hurting for 3 months.  2. QUALITY: What does the pain feel like?  (e.g., sharp, dull, aching, burning)     It feels like a pinched nerve. 3. SEVERITY: How bad is the pain? What does it keep you from doing?   (Scale 1-10; or mild, moderate, severe)     It doesn't keep me from doing anything I normally do. 4. ONSET: When did the pain start? Does it come and go, or is it there all the time?     3 months ago 5. RECURRENT: Have you had this pain before? If Yes, ask: When, and what happened then?     No 6. SETTING: Has there been any recent work, exercise or other activity that involved that part of the body?      No  7. AGGRAVATING FACTORS: What makes the knee pain worse? (e.g., walking, climbing stairs, running)     Nothing 8. ASSOCIATED SYMPTOMS: Is there any swelling or redness of the knee?     If I touch below my kneecap on the inside there is a sore place.  If I move my knee the wrong way it hurts.  9. OTHER SYMPTOMS: Do you have any other symptoms? (e.g., calf pain, chest pain, difficulty breathing, fever)     No 10. PREGNANCY: Is there any chance  you are pregnant? When was your last menstrual period?       N/A due to age  Protocols used: Knee Pain-A-AH FYI Only or Action Required?: FYI only for provider.  Patient was last seen in primary care on 04/02/2024 by Sharma Coyer, MD.  Called Nurse Triage reporting Knee Pain. Soreness on the inside of the left kneecap for last 3 months.   No injuries or accidents.    Symptoms began several months ago. 3 months ago  Interventions attempted: Rest, hydration, or home remedies and Ice/heat application.  Symptoms are: gradually worsening.or not getting better.     Triage Disposition: See PCP Within 2 Weeks  Patient/caregiver understands and will follow disposition?: Yes

## 2024-06-18 ENCOUNTER — Encounter: Payer: Self-pay | Admitting: Family Medicine

## 2024-06-18 ENCOUNTER — Ambulatory Visit: Admitting: Family Medicine

## 2024-06-18 VITALS — BP 122/68 | HR 56 | Ht 66.0 in | Wt 190.0 lb

## 2024-06-18 DIAGNOSIS — Z23 Encounter for immunization: Secondary | ICD-10-CM | POA: Diagnosis not present

## 2024-06-18 DIAGNOSIS — S838X2A Sprain of other specified parts of left knee, initial encounter: Secondary | ICD-10-CM | POA: Diagnosis not present

## 2024-06-18 MED ORDER — PREDNISONE 10 MG PO TABS: ORAL_TABLET | ORAL | 0 refills | Status: AC

## 2024-06-18 NOTE — Patient Instructions (Signed)
 Recommended vaccines at the pharmacy: Tdap and Shingrix (shingles)

## 2024-06-18 NOTE — Progress Notes (Signed)
 Established patient visit   Patient: Mary [name]   DOB: [birth year]   73 y.o. Female  MRN: [id number] Visit Date: 06/18/2024  Today's healthcare provider: LAURAINE LOISE BUOY, DO   Chief Complaint  Patient presents with   Knee Pain    L knee pain for about 4 months, it's not getting worst but not better either, pain at about a 3 It bothers her more when she is trying to lift it over her R knee (had a fall back in April) didn't get checked out    Subjective    Knee Pain    Mary [name] is a 73 year old female who presents with left knee pain persisting for four months.  She has been experiencing left knee pain for the past four months following a fall where she landed on her buttocks. The pain is described as a sharp twinge, akin to 'pinching a nerve', and is primarily located under the knee cap. It is exacerbated by twisting or bending the knee, particularly when lifting the leg or tying shoes.  The pain does not bother her while walking or when the knee is kept straight. However, bending or twisting the knee, such as when sitting on the couch or tying shoes, causes discomfort. She has been avoiding movements that exacerbate the pain and has changed her seating arrangements to avoid bending the knee. She has not taken any medication for the pain, as it has not been severe, but she is concerned about potential long-term damage.  She has attempted to manage the pain by avoiding certain movements and has started exercising in the pool, which she finds easier on her knee. She has not experienced any locking or clicking in the knee. Initially, she used ice to manage the pain, especially when turning over in bed was painful, but she has since learned to move differently to avoid discomfort.  She has resumed some physical activity, including attending Zumba classes twice a week, and is trying to gradually return to her previous exercise routine. The pain has improved over time but persists  intermittently, particularly with certain movements.      Medications: Outpatient Medications Prior to Visit  Medication Sig   brimonidine (ALPHAGAN) 0.2 % ophthalmic solution Place into both eyes in the morning and at bedtime. (Patient not taking: Reported on 06/18/2024)   No facility-administered medications prior to visit.        Objective    BP 122/68 (BP Location: Left Arm, Patient Position: Sitting, Cuff Size: Normal)   Pulse (!) 56   Ht 5' 6 (1.676 m)   Wt 190 lb (86.2 kg)   LMP 10/11/1999 (Approximate)   SpO2 97%   BMI 30.67 kg/m     Physical Exam Vitals and nursing note reviewed.  Constitutional:      General: She is not in acute distress.    Appearance: Normal appearance.  HENT:     Head: Normocephalic and atraumatic.  Eyes:     General: No scleral icterus.    Conjunctiva/sclera: Conjunctivae normal.  Cardiovascular:     Rate and Rhythm: Normal rate.  Pulmonary:     Effort: Pulmonary effort is normal.  Musculoskeletal:     Right knee: Crepitus present. No swelling, deformity, effusion, erythema, ecchymosis, lacerations or bony tenderness. Normal range of motion. No tenderness. Abnormal alignment. Normal meniscus and normal patellar mobility.     Left knee: Crepitus present. No swelling, deformity, effusion, erythema, ecchymosis, lacerations or bony tenderness.  Decreased range of motion (external rotation at hip causes medial knee pain). Tenderness present over the medial joint line. No lateral joint line, MCL, LCL, ACL, PCL or patellar tendon tenderness. No LCL laxity or MCL laxity.Abnormal alignment. Normal meniscus and normal patellar mobility. Normal pulse.  Neurological:     Mental Status: She is alert and oriented to person, place, and time. Mental status is at baseline.  Psychiatric:        Mood and Affect: Mood normal.        Behavior: Behavior normal.      No results found for any visits on 06/18/24.  Assessment & Plan    Acute medial meniscal  injury of left knee, initial encounter -     predniSONE ; Take 60mg  PO daily x1d, then 50mg  daily x1d, then 40mg  daily x1d, then 30mg  daily x1d, then 20mg  daily x1d, then 10mg  daily x1d, then stop  Dispense: 21 tablet; Refill: 0  Needs flu shot -     Flu vaccine HIGH DOSE PF(Fluzone Trivalent)     Acute medial meniscus injury of left knee Left knee pain for four months, likely medial meniscus injury. Pain localized under knee, worsened by twisting and bending, with tenderness and mild swelling. No locking or clicking. Differential includes medial collateral ligament injury, but less likely due to lack of laxity. Pain improved slightly but persists with certain movements. Concern about long-term damage. - Prescribe prednisone  taper pack to reduce inflammation. - Advise rest for one week to prevent exacerbation. - Provide knee strengthening exercises with gradual progression. - Encourage continued pool exercises. - Consider x-ray for further assessment if not improving.  General Health Maintenance Due for shingles and tetanus vaccines. Advised new Shingrix vaccine for shingles and Tdap booster for tetanus. Discussed need for COVID booster, requiring prescription in [place] . - Recommend Shingrix vaccine series for shingles. - Advise Tdap booster for tetanus. - Provide prescription for COVID booster.    Return if symptoms worsen or fail to improve.      I discussed the assessment and treatment plan with the patient  The patient was provided an opportunity to ask questions and all were answered. The patient agreed with the plan and demonstrated an understanding of the instructions.   The patient was advised to call back or seek an in-person evaluation if the symptoms worsen or if the condition fails to improve as anticipated.    LAURAINE LOISE BUOY, DO  [facility] [phone] (phone) [phone] (fax)  [facility]

## 2024-07-03 ENCOUNTER — Encounter: Payer: Self-pay | Admitting: Family Medicine

## 2024-09-06 ENCOUNTER — Telehealth: Payer: Self-pay

## 2025-02-25 ENCOUNTER — Ambulatory Visit
